# Patient Record
Sex: Male | Born: 1980
Health system: Southern US, Community
[De-identification: ages and names within clinical notes are randomized; demographics above are authoritative.]

## PROBLEM LIST (undated history)

## (undated) DIAGNOSIS — L0591 Pilonidal cyst without abscess: Secondary | ICD-10-CM

## (undated) HISTORY — PX: WISDOM TOOTH EXTRACTION: SHX21

## (undated) HISTORY — DX: Pilonidal cyst without abscess: L05.91

---

## 2002-11-03 ENCOUNTER — Encounter: Payer: Self-pay | Admitting: *Deleted

## 2002-11-03 ENCOUNTER — Emergency Department (HOSPITAL_COMMUNITY): Admission: EM | Admit: 2002-11-03 | Discharge: 2002-11-03 | Payer: Self-pay | Admitting: *Deleted

## 2010-06-14 ENCOUNTER — Emergency Department (HOSPITAL_COMMUNITY): Admission: EM | Admit: 2010-06-14 | Discharge: 2010-06-14 | Payer: Self-pay | Admitting: Emergency Medicine

## 2010-12-04 LAB — BASIC METABOLIC PANEL
BUN: 13 mg/dL (ref 6–23)
CO2: 24 mEq/L (ref 19–32)
Calcium: 9.6 mg/dL (ref 8.4–10.5)
Chloride: 104 mEq/L (ref 96–112)
Creatinine, Ser: 1.05 mg/dL (ref 0.4–1.5)
GFR calc Af Amer: >60 mL/min (ref >60)
GFR calc non Af Amer: >60 mL/min (ref >60)
Glucose, Bld: 83 mg/dL (ref 70–99)
Potassium: 3.8 mEq/L (ref 3.5–5.1)
Sodium: 136 mEq/L (ref 135–145)

## 2010-12-04 LAB — POCT CARDIAC MARKERS
CKMB, poc: <1 ng/mL — ABNORMAL LOW (ref 1.0–8.0)
Myoglobin, poc: 52.4 ng/mL (ref 12–200)
Troponin i, poc: <0.05 ng/mL (ref 0.00–0.09)

## 2010-12-04 LAB — CBC
HCT: 45.7 % (ref 39.0–52.0)
Hemoglobin: 15.2 g/dL (ref 13.0–17.0)
MCH: 28.3 pg (ref 26.0–34.0)
MCHC: 33.2 g/dL (ref 30.0–36.0)
MCV: 85.3 fL (ref 78.0–100.0)
Platelets: 287 10*3/uL (ref 150–400)
RBC: 5.36 MIL/uL (ref 4.22–5.81)
RDW: 13.6 % (ref 11.5–15.5)
WBC: 10.1 10*3/uL (ref 4.0–10.5)

## 2010-12-04 LAB — D-DIMER, QUANTITATIVE: D-Dimer, Quant: 0.22 ug/mL-FEU (ref 0.00–0.48)

## 2013-01-16 ENCOUNTER — Ambulatory Visit (INDEPENDENT_AMBULATORY_CARE_PROVIDER_SITE_OTHER): Payer: Self-pay | Admitting: Family Medicine

## 2013-01-16 ENCOUNTER — Encounter: Payer: Self-pay | Admitting: Family Medicine

## 2013-01-16 VITALS — BP 110/80 | Temp 98.8°F | Ht 71.0 in | Wt 207.2 lb

## 2013-01-16 DIAGNOSIS — J019 Acute sinusitis, unspecified: Secondary | ICD-10-CM

## 2013-01-16 DIAGNOSIS — R509 Fever, unspecified: Secondary | ICD-10-CM

## 2013-01-16 MED ORDER — AZITHROMYCIN 250 MG PO TABS: ORAL_TABLET | ORAL | Status: DC

## 2013-01-16 MED ORDER — HYDROCODONE-HOMATROPINE 5-1.5 MG/5ML PO SYRP: 5.0000 mL | ORAL_SOLUTION | Freq: Four times a day (QID) | ORAL | Status: DC | PRN

## 2013-01-16 NOTE — Progress Notes (Signed)
  Subjective:    Patient ID: ZAYVIER CARAVELLO, male    DOB: [birth year], 32 y.o.   MRN: [id number]  Fever  This is a new problem. The current episode started in the past 7 days. The problem occurs intermittently. The problem has been unchanged. The maximum temperature noted was 100 to 100.9 F. The temperature was taken using an oral thermometer. Associated symptoms include congestion, coughing and muscle aches. Treatments tried: dayquil and nyquil. The treatment provided mild relief.   Patient relates there is no one else has been sick around him but this has all come on since Friday.   Review of Systems  Constitutional: Positive for fever.  HENT: Positive for congestion.   Respiratory: Positive for cough.        Objective:   Physical Exam On exam eardrums normal throat minimal erythema neck is supple mild sinus tenderness lungs are clear no wheezing or difficulty breathing.       Assessment & Plan:  Febrile illness with probable sinusitis-Zithromax as prescribed. Hycodan at nighttime as needed for cough cautioned drowsiness, if progressive symptoms or worse over the next 48 hours to notify us.he was told that he probably has a flulike illness with some secondary infection.

## 2014-12-31 ENCOUNTER — Encounter: Payer: Self-pay | Admitting: Family Medicine

## 2014-12-31 ENCOUNTER — Ambulatory Visit (INDEPENDENT_AMBULATORY_CARE_PROVIDER_SITE_OTHER): Payer: 59 | Admitting: Family Medicine

## 2014-12-31 VITALS — BP 110/84 | Temp 100.4°F | Ht 71.0 in | Wt 218.0 lb

## 2014-12-31 DIAGNOSIS — J1189 Influenza due to unidentified influenza virus with other manifestations: Secondary | ICD-10-CM | POA: Diagnosis not present

## 2014-12-31 DIAGNOSIS — J111 Influenza due to unidentified influenza virus with other respiratory manifestations: Secondary | ICD-10-CM

## 2014-12-31 MED ORDER — OSELTAMIVIR PHOSPHATE 75 MG PO CAPS: 75.0000 mg | ORAL_CAPSULE | Freq: Two times a day (BID) | ORAL | Status: DC

## 2014-12-31 NOTE — Progress Notes (Signed)
   Subjective:    Patient ID: Herbert Davis, male    DOB: Jul 24, 1981, 34 y.o.   MRN: [id number]  Sinusitis This is a new problem. The current episode started yesterday. The problem is unchanged. The maximum temperature recorded prior to his arrival was 100.4 - 100.9 F. The pain is moderate. Associated symptoms include congestion, coughing, headaches and a sore throat. Past treatments include acetaminophen. The treatment provided mild relief.  Patient states he has no other concerns.   Cough substantial worse when laying down  achey in the back dim energy    100.9  Tylenol,,    Review of Systems  HENT: Positive for congestion and sore throat.   Respiratory: Positive for cough.   Neurological: Positive for headaches.       Objective:   Physical Exam  Alert moderate malaise. No frontal tenderness. No packs or tenderness. Pharynx normal neck supple. Lungs bronchial cough heart regular in rhythm.      Assessment & Plan:  Impression flu plan Tamiflu twice a day 5 days. Symptomatic care discussed. Warning signs discussed WSL

## 2017-07-02 ENCOUNTER — Emergency Department (HOSPITAL_COMMUNITY)
Admission: EM | Admit: 2017-07-02 | Discharge: 2017-07-02 | Disposition: A | Discharge status: Home / Self Care | Payer: Worker's Compensation | Attending: Emergency Medicine | Admitting: Emergency Medicine

## 2017-07-02 ENCOUNTER — Encounter (HOSPITAL_COMMUNITY): Payer: Self-pay | Admitting: Emergency Medicine

## 2017-07-02 DIAGNOSIS — H9202 Otalgia, left ear: Secondary | ICD-10-CM | POA: Diagnosis present

## 2017-07-02 DIAGNOSIS — H7292 Unspecified perforation of tympanic membrane, left ear: Secondary | ICD-10-CM | POA: Diagnosis not present

## 2017-07-02 MED ORDER — OFLOXACIN 0.3 % OT SOLN: 5.0000 [drp] | Freq: Two times a day (BID) | OTIC | 0 refills | Status: AC

## 2017-07-02 NOTE — ED Triage Notes (Signed)
Pt presents to ED after he was at work, was wearing a head set, stated he got the fax machine feedback in his headset, and has been having deafness in his left ear, with a dull ache.  Pt denies any discharge from his ear.

## 2017-07-02 NOTE — ED Notes (Signed)
Patient is A&Ox4.  No signs of distress noted.  Please see providers complete history and physical exam.  

## 2017-07-02 NOTE — ED Provider Notes (Signed)
[facility]-EMERGENCY DEPT Provider Note   CSN: [id number] Arrival date & time: 07/02/17  1804     History   Chief Complaint Chief Complaint  Patient presents with  . Otalgia    HPI Herbert Davis is a 36 y.o. male.  HPI  36 y.o. male, presents to the Emergency Department today due to left ear pain. States this occurred when wearing a headset and hearing a loud noise. This occurred around 3pm. Notes decrease in hearing initially, but slowly returning. States he hears a humming noise constantly. No blood from ear. No pain. Notes discharge. No fevers. No other symptoms noted.    History reviewed. No pertinent past medical history.  There are no active problems to display for this patient.   History reviewed. No pertinent surgical history.     Home Medications    Prior to Admission medications   Medication Sig Start Date End Date Taking? Authorizing Provider  oseltamivir (TAMIFLU) 75 MG capsule Take 1 capsule (75 mg total) by mouth 2 (two) times daily. 12/31/14   Merlyn Albert, MD    Family History History reviewed. No pertinent family history.  Social History Social History  Substance Use Topics  . Smoking status: Never Smoker  . Smokeless tobacco: Never Used  . Alcohol use No     Allergies   Patient has no known allergies.   Review of Systems Review of Systems ROS reviewed and all are negative for acute change except as noted in the HPI.  Physical Exam Updated Vital Signs BP (!) 138/92 (BP Location: Left Arm)   Pulse (!) 115   Temp 98.2 F (36.8 C) (Oral)   Resp (!) 22   SpO2 97%   Physical Exam  Constitutional: He is oriented to person, place, and time. Vital signs are normal. He appears well-developed and well-nourished.  HENT:  Head: Normocephalic and atraumatic.  Right Ear: Hearing, tympanic membrane, external ear and ear canal normal.  Left Ear: Hearing normal.  Left TM with perforation. Discharge noted. No bleeding. No edema.   Eyes: Pupils  are equal, round, and reactive to light. Conjunctivae and EOM are normal.  Neck: Normal range of motion. Neck supple.  Cardiovascular: Normal rate and regular rhythm.   Pulmonary/Chest: Effort normal.  Musculoskeletal: Normal range of motion.  Neurological: He is alert and oriented to person, place, and time.  Skin: Skin is warm and dry.  Psychiatric: He has a normal mood and affect. His speech is normal and behavior is normal. Thought content normal.  Nursing note and vitals reviewed.    ED Treatments / Results  Labs (all labs ordered are listed, but only abnormal results are displayed) Labs Reviewed - No data to display  EKG  EKG Interpretation None       Radiology No results found.  Procedures Procedures (including critical care time)  Medications Ordered in ED Medications - No data to display   Initial Impression / Assessment and Plan / ED Course  I have reviewed the triage vital signs and the nursing notes.  Pertinent labs & imaging results that were available during my care of the patient were reviewed by me and considered in my medical decision making (see chart for details).  Final Clinical Impressions(s) / ED Diagnoses     {I have reviewed the relevant previous healthcare records.  {I obtained HPI from historian.   ED Course:  Assessment: Pt is a 36 y.o. male presents to the Emergency Department today due to left ear pain.  States this occurred when wearing a headset and hearing a loud noise. This occurred around 3pm. Notes decrease in hearing initially, but slowly returning. States he hears a humming noise constantly. No blood from ear. No pain. Notes discharge. No fevers. On exam, pt in NAD. Nontoxic/nonseptic appearing. VSS. Afebrile. Left ear with perforation of TM. No bleeding. No fevers. Rx ABX drops. Counseled on TM rupture precautions. Close follow up to PCP. Plan is to DC home. At time of discharge, Patient is in no acute distress. Vital Signs are stable.  Patient is able to ambulate. Patient able to tolerate PO.   Disposition/Plan:  DC Home Additional Verbal discharge instructions given and discussed with patient.  Pt Instructed to f/u with PCP in the next week for evaluation and treatment of symptoms. Return precautions given Pt acknowledges and agrees with plan  Supervising Physician Vanetta Mulders, MD  Final diagnoses:  Perforation of left tympanic membrane    New Prescriptions New Prescriptions   No medications on file     Audry Pili, Cordelia Poche 07/02/17 2156    Vanetta Mulders, MD 07/03/17 1651

## 2017-07-02 NOTE — Discharge Instructions (Signed)
Please read and follow all provided instructions.  Your diagnoses today include:  1. Perforation of left tympanic membrane     Tests performed today include: Vital signs. See below for your results today.   Medications prescribed:  Take as prescribed   Home care instructions:  Follow any educational materials contained in this packet.  Follow-up instructions: Please follow-up with your primary care provider for further evaluation of symptoms and treatment   Return instructions:  Please return to the Emergency Department if you do not get better, if you get worse, or new symptoms OR  - Fever (temperature greater than 101.57F)  - Bleeding that does not stop with holding pressure to the area    -Severe pain (please note that you may be more sore the day after your accident)  - Chest Pain  - Difficulty breathing  - Severe nausea or vomiting  - Inability to tolerate food and liquids  - Passing out  - Skin becoming red around your wounds  - Change in mental status (confusion or lethargy)  - New numbness or weakness    Please return if you have any other emergent concerns.  Additional Information:  Your vital signs today were: BP (!) 138/92 (BP Location: Left Arm)    Pulse (!) 115    Temp 98.2 F (36.8 C) (Oral)    Resp (!) 22    SpO2 97%  If your blood pressure (BP) was elevated above 135/85 this visit, please have this repeated by your doctor within one month. ---------------

## 2017-07-02 NOTE — ED Notes (Signed)
PT states understanding of care given, follow up care, and medication prescribed. PT is ambulated from ED to car with a steady gait.  

## 2017-07-05 ENCOUNTER — Telehealth: Payer: Self-pay | Admitting: Family Medicine

## 2017-07-05 ENCOUNTER — Encounter: Payer: Self-pay | Admitting: Family Medicine

## 2017-07-05 ENCOUNTER — Ambulatory Visit (INDEPENDENT_AMBULATORY_CARE_PROVIDER_SITE_OTHER): Payer: Managed Care, Other (non HMO) | Admitting: Family Medicine

## 2017-07-05 VITALS — BP 118/84 | Temp 98.3°F | Ht 71.0 in | Wt 220.0 lb

## 2017-07-05 DIAGNOSIS — H9312 Tinnitus, left ear: Secondary | ICD-10-CM | POA: Diagnosis not present

## 2017-07-05 DIAGNOSIS — H9192 Unspecified hearing loss, left ear: Secondary | ICD-10-CM | POA: Diagnosis not present

## 2017-07-05 NOTE — Telephone Encounter (Signed)
Ok so noted 

## 2017-07-05 NOTE — Telephone Encounter (Signed)
FYI, Patient seen today, talked with employer and they are going to set up the referral to an ENT doctor for him so we don't need to do it.

## 2017-07-05 NOTE — Progress Notes (Signed)
   Subjective:    Patient ID: Herbert Davis, male    DOB: 1981/02/01, 36 y.o.   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 The call was a fax coming in and it was loud and went to [facility] Ed on Friday night and left ear was preforation of left tympanic membrane. Still has some pain and has a muffled hearing in that ear. Patient had a loud noise come through his headset caused ear pain he reported it to his supervisor he was referred for evaluation that the ER he was diagnosed with a perforated eardrum he was told to follow-up here he is now having ringing and hearing loss in the left ear he denies any other particular problems denies any previous trouble denies any other injuries Review of Systems No respiratory symptoms head symptoms headache nausea vomiting diarrhea    Objective:   Physical Exam Left ear canal with some wax I can see the eardrum not in totality Right eardrum normal nares normal throat normal neck no masses lungs clear  Hearing screen was done shows some mild deficit on the left side     Assessment & Plan:  Tendinitis Reported eardrum perforation Hearing loss Avoid using headset on the left ear Urgent ENT referral Patient will check with his workplace to make sure that this is not going to go to a specific ENT he will call us back later today

## 2017-07-05 NOTE — Telephone Encounter (Signed)
I think scott saw, not sure if referral done, send to dr scott plz

## 2017-07-06 ENCOUNTER — Telehealth: Payer: Self-pay | Admitting: Family Medicine

## 2017-07-06 ENCOUNTER — Ambulatory Visit: Payer: Managed Care, Other (non HMO) | Admitting: Family Medicine

## 2017-07-06 NOTE — Progress Notes (Signed)
Please disregard ENT referral patient is having a referral ordered through epic.

## 2017-07-06 NOTE — Telephone Encounter (Signed)
Scott saw (see last tele note), plz send to scott

## 2017-07-06 NOTE — Telephone Encounter (Signed)
Spoke with patient and informed him per Dr.Scott Luking- Although his issue  may cause some dizziness and some balance issues and should not be severe, if the patient is falling or having severe issues the patient would need to be rechecked again. The patient needs to be careful with his driving and walking to make sure he is not a danger to himself with accidents or injuries. Hopefully his workplace we'll set him up with ENT on a relatively quick basis. 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 Patient verbalized understanding.

## 2017-07-06 NOTE — Progress Notes (Signed)
According to a phone call from the patient his workplace will be setting him up with ENT because it was Workmen's Comp. Therefore we do not need to do this referral. It can be canceled thank you

## 2017-07-06 NOTE — Addendum Note (Signed)
Addended by: Jeralene Peters on: 07/06/2017 08:33 AM   Modules accepted: Orders

## 2017-07-06 NOTE — Telephone Encounter (Signed)
Patient called stating that he has been experiencing dizzy spells and difficulty with balance. Patient wanted to know if this was normal. Was seen on yesterday with hearing problem and seen at [facility] on Friday with perforation of left tympanic membrane. Please advise?

## 2017-07-06 NOTE — Progress Notes (Signed)
Referral ordered in epic 

## 2017-07-06 NOTE — Telephone Encounter (Signed)
Although his issue  may cause some dizziness and some balance issues and should not be severe, if the patient is falling or having severe issues the patient would need to be rechecked again. The patient needs to be careful with his driving and walking to make sure he is not a danger to himself with accidents or injuries. Hopefully his workplace we'll set him up with ENT on a relatively quick basis. 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

## 2017-07-07 ENCOUNTER — Telehealth: Payer: Self-pay | Admitting: Family Medicine

## 2017-07-07 NOTE — Telephone Encounter (Signed)
Please go forward with ENT referral urgent referral for ear pain dizziness perforated eardrum Stephen[place] or [place] would be fine whoever can see him sooner

## 2017-07-07 NOTE — Telephone Encounter (Signed)
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  He is requesting a referral to ENT.

## 2017-09-27 ENCOUNTER — Encounter: Payer: Self-pay | Admitting: Family Medicine

## 2017-09-27 ENCOUNTER — Ambulatory Visit (INDEPENDENT_AMBULATORY_CARE_PROVIDER_SITE_OTHER): Payer: Managed Care, Other (non HMO) | Admitting: Family Medicine

## 2017-09-27 DIAGNOSIS — G3281 Cerebellar ataxia in diseases classified elsewhere: Secondary | ICD-10-CM

## 2017-09-27 NOTE — Progress Notes (Signed)
   Subjective:    Patient ID: Herbert Davis, male    DOB: May 07, 1981, 37 y.o.   MRN: [id number] Patient arrives office with progressively worsening neurological symptoms.  Due to see a neurologist but not for a full month from now HPI Patient here today with same symptoms as last fall the dizziness, balance issues and  Left ear pain.Was referred to ENT Dr. Ezzard StandingNewman and all test were normal with him per pt.Then Dr. Ezzard StandingNewman referred to Neurology Dr. Anne HahnWillis that appointment is not until Oct 26, 2016.  Out of work since oct  At night gets ear pain and dizziness and discomfort   Dizzy hits when turns head  See emergency room report.  See prior notes.  Patient's difficulty started with extremely loud sound in his ear.  This led to a question TM perforation.  See the ER note.  This occurred at work.  Patient initially had dizziness unsteadiness vertigo-like symptoms which would occur the first moments after movement.  Progressive however over the past 6 weeks he has developed further neurological symptoms.  Unsteadiness.  Dizziness.  Difficult walking.  Substantial.  Now using a cane.  Has moved home.  No longer able to drive.  No longer able to work.  Review of Systems No headache, no major weight loss or weight gain, no chest pain no back pain abdominal pain no change in bowel habits complete ROS otherwise negative     Objective:   Physical Exam Alert and oriented, vitals reviewed and stable, NAD ENT-TM's and ext canals WNL bilat via otoscopic exam Soft palate, tonsils and post pharynx WNL via oropharyngeal exam Neck-symmetric, no masses; thyroid nonpalpable and nontender Pulmonary-no tachypnea or accessory muscle use; Clear without wheezes via auscultation Card--no abnrml murmurs, rhythm reg and rate WNL Carotid pulses symmetric, without bruits Neurological findings concerning.  Patient has diffuse weakness of legs 2/5 bilateral.  Hyperreflexia.  4+ both ankles.  Less minus clonus on the  right ankle.  Walks with a cane.  Wide standing ataxic gait.  Cerebellar functions positive left arm.  Diminished strength left arm       Assessment & Plan:  Impression progressive neurological decompensation over the past several months.  I am very concerned that he has some more serious neurologically speaking than simple inner ear barotrauma.  His neurological decline continues to worsen substantially.  Both symptoms and exam performed towards a central nervous system process.  I think urgent brain MRI along with subsequent action based on results is definitely warranted.  Reason for MRI progressive ataxic gait.  Neuromuscular weakness.  Hyperreflexia.

## 2017-09-30 ENCOUNTER — Ambulatory Visit (HOSPITAL_COMMUNITY): Payer: Managed Care, Other (non HMO)

## 2017-10-01 ENCOUNTER — Telehealth: Payer: Self-pay | Admitting: Family Medicine

## 2017-10-01 NOTE — Telephone Encounter (Signed)
Please let the patient know that his MRI of the brain was negative.  He did not show any tumors growth or strokes.  It would be a good idea for the patient to obtain the images on a CD ROM from the [place] [facility].  He can take this with him when he sees a neurologist in the first part of February.(A copy of this message is also being forwarded to Dr. Lubertha SouthSteve Kathrynn Backstrom)

## 2017-10-01 NOTE — Telephone Encounter (Signed)
Patient advised per Dr Lorin PicketScott : His MRI of the brain was negative.  He did not show any tumors growth or strokes.  It would be a good idea for the patient to obtain the images on a CD ROM from the [place] [facility].  He can take this with him when he sees a neurologist in the first part of February. Patient verbalized understanding.

## 2017-10-26 ENCOUNTER — Ambulatory Visit (INDEPENDENT_AMBULATORY_CARE_PROVIDER_SITE_OTHER): Payer: Managed Care, Other (non HMO) | Admitting: Neurology

## 2017-10-26 ENCOUNTER — Encounter: Payer: Self-pay | Admitting: Neurology

## 2017-10-26 VITALS — BP 133/84 | HR 127 | Ht 71.0 in | Wt 219.5 lb

## 2017-10-26 DIAGNOSIS — H81399 Other peripheral vertigo, unspecified ear: Secondary | ICD-10-CM | POA: Diagnosis not present

## 2017-10-26 DIAGNOSIS — M79602 Pain in left arm: Secondary | ICD-10-CM | POA: Diagnosis not present

## 2017-10-26 NOTE — Progress Notes (Signed)
Reason for visit: Dizziness, gait instability  Referring physician: Dr. Truitt Merle Herbert Davis is a 37 y.o. male  History of present illness:  Herbert Davis is a 37 year old left-handed white male with a history of a work-related injury.  The patient indicates that around 02 July 2017 he was at work wearing headphones.  The fax machine kicked in and he got a loud noise in his ears from this.  The patient has had onset of tinnitus and some dizziness that began within 24 hours of this event.  He was seen through ENT with Dr. Narda Bonds, audiometry showed no hearing impairment.  The patient had ENG evaluation that showed no evidence of vestibular dysfunction or benign positional vertigo.  The patient does not report true vertigo, he has more of a lightheaded sensation.  Within a week or so after the episode, he began having some pain around the left ear, left neck pain and discomfort down the left arm.  The patient has had some tingling sensations in the fourth and fifth fingers of the left hand, some sensation of weakness in the left hand.  The patient has had an extremely wide-based gait, he uses a cane for ambulation.  He reports no falls.  He denies issues controlling the bowels or the bladder.  He has not had any double vision or loss of vision or overt headache.  The patient has undergone MRI of the brain that was normal, the disc was brought for my review.  The patient is sent here for further evaluation.  The patient indicates that Workmen's Compensation was denied, he intends to get an attorney to appeal the denial.  History reviewed. No pertinent past medical history.  History reviewed. No pertinent surgical history.  History reviewed. No pertinent family history.  Social history:  reports that  has never smoked. he has never used smokeless tobacco. He reports that he does not drink alcohol or use drugs.  Medications:  Prior to Admission medications   Not on File     No Known  Allergies  ROS:  Out of a complete 14 system review of symptoms, the patient complains only of the following symptoms, and all other reviewed systems are negative.  Ringing in the ears Headache, numbness, dizziness Not enough sleep  Blood pressure 133/84, pulse (!) 127, height 5\' 11"  (1.803 m), weight 219 lb 8 oz (99.6 kg).  Physical Exam  General: The patient is alert and cooperative at the time of the examination.  Eyes: Pupils are equal, round, and reactive to light. Discs are flat bilaterally.  Neck: The neck is supple, no carotid bruits are noted.  Respiratory: The respiratory examination is clear.  Cardiovascular: The cardiovascular examination reveals a regular rate and rhythm, no obvious murmurs or rubs are noted.  Skin: Extremities are without significant edema.  Neurologic Exam  Mental status: The patient is alert and oriented x 3 at the time of the examination. The patient has apparent normal recent and remote memory, with an apparently normal attention span and concentration ability.  Cranial nerves: Facial symmetry is present. There is good sensation of the face to pinprick and soft touch bilaterally. The strength of the facial muscles and the muscles to head turning and shoulder shrug are normal bilaterally. Speech is well enunciated, no aphasia or dysarthria is noted. Extraocular movements are full. Visual fields are full. The tongue is midline, and the patient has symmetric elevation of the soft palate. No obvious hearing deficits are noted.  Motor: The motor testing reveals 5 over 5 strength of all 4 extremities.  There appears to be some giveaway weakness with the left arm, no true weakness is seen.  Good symmetric motor tone is noted throughout.  Sensory: Sensory testing is intact to pinprick, soft touch, vibration sensation, and position sense on all 4 extremities. No evidence of extinction is noted.  Coordination: Cerebellar testing reveals good  finger-nose-finger and heel-to-shin bilaterally. The Nyan-Barrany procedure does reveal some slight asymmetry of end gaze nystagmus, more prominent to the left.  Gait and station: Gait is extremely wide-based, the patient normally walks with a cane.  Tandem gait is unsteady.  Romberg is positive, the patient will follow up to the side. No drift is seen.  Reflexes: Deep tendon reflexes are symmetric and normal bilaterally. Toes are downgoing bilaterally.   Assessment/Plan:  1.  Reports of dizziness, gait instability  2.  Left neck pain, left arm pain and numbness  So far the clinical evaluation has been unrevealing.  The patient has had a normal MRI of the brain, normal audiometric testing, normal ENG evaluation.  The patient does report neck pain and some left arm discomfort and numbness.  The clinical examination suggests the possibility of an over exaggerated gait disorder.  There is some giveaway weakness with the left arm.  The patient will be sent for MRI of the cervical spine given the reports of neck pain and left arm discomfort.  The patient will follow-up in 4 months.  Marlan Palau. Keith Willis MD 10/26/2017 2:26 PM  [facility] [address] [place], [place] [id number]  Phone [phone] Fax [phone]

## 2017-10-26 NOTE — Patient Instructions (Signed)
   We will get MRI of the cervical spine. 

## 2017-11-05 ENCOUNTER — Telehealth: Payer: Self-pay | Admitting: Neurology

## 2017-11-05 DIAGNOSIS — M542 Cervicalgia: Secondary | ICD-10-CM

## 2017-11-05 NOTE — Telephone Encounter (Signed)
Herbert [employer] will deny the MRI of the cervical spine as the patient has not undergone at least 6 weeks of conservative management.  We will cancel the MRI of the cervical spine, set the patient up for physical therapy, follow-up with the patient.

## 2017-11-05 NOTE — Telephone Encounter (Signed)
Noted, I contacted Victorino DikeJennifer at [facility] and she is going to cancel his appt.

## 2017-11-05 NOTE — Telephone Encounter (Signed)
Dr. Pattricia BossWIlis this is a peer to peer. PT due for scan on 11/08/2017 Monday.

## 2017-11-05 NOTE — Telephone Encounter (Signed)
Rosann [employer] did not approve the MRI Cervical. The Phone number for the peer to peer is [phone] and the case number is [id number]. Right now he is schedule to have the exam done on Monday [facility]/17 at [facility].

## 2017-11-05 NOTE — Telephone Encounter (Signed)
Per. Dr.Willis MRI will be cancel due. EMily did contacted [facility].

## 2017-11-08 ENCOUNTER — Inpatient Hospital Stay: Admission: RE | Admit: 2017-11-08 | Payer: Self-pay | Source: Ambulatory Visit

## 2017-11-22 ENCOUNTER — Other Ambulatory Visit: Payer: Self-pay

## 2017-11-22 ENCOUNTER — Ambulatory Visit: Payer: Managed Care, Other (non HMO) | Attending: Neurology | Admitting: Physical Therapy

## 2017-11-22 ENCOUNTER — Encounter: Payer: Self-pay | Admitting: Physical Therapy

## 2017-11-22 DIAGNOSIS — R2681 Unsteadiness on feet: Secondary | ICD-10-CM | POA: Insufficient documentation

## 2017-11-22 DIAGNOSIS — M6281 Muscle weakness (generalized): Secondary | ICD-10-CM | POA: Insufficient documentation

## 2017-11-22 DIAGNOSIS — R208 Other disturbances of skin sensation: Secondary | ICD-10-CM

## 2017-11-22 DIAGNOSIS — M542 Cervicalgia: Secondary | ICD-10-CM

## 2017-11-22 DIAGNOSIS — R262 Difficulty in walking, not elsewhere classified: Secondary | ICD-10-CM | POA: Insufficient documentation

## 2017-11-22 DIAGNOSIS — R42 Dizziness and giddiness: Secondary | ICD-10-CM | POA: Insufficient documentation

## 2017-11-22 NOTE — Therapy (Signed)
[facility] [address] [place], [place], [address] Phone: [phone]   Fax:  [phone]  Physical Therapy Evaluation  Patient Details  Name: Herbert Davis MRN: [id number] Date of Birth: 10/25/1980 Referring Provider: York Spaniel, MD   Encounter Date: 11/22/2017  PT End of Session - 11/22/17 1301    Visit Number  1    Number of Visits  9    Date for PT Re-Evaluation  01/21/18    Authorization Type  [employer] Managed    PT Start Time  1150    PT Stop Time  1244    PT Time Calculation (min)  54 min    Activity Tolerance  Patient tolerated treatment well    Behavior During Therapy  [facility] for tasks assessed/performed       History reviewed. No pertinent past medical history.  History reviewed. No pertinent surgical history.  There were no vitals filed for this visit.   Subjective Assessment - 11/22/17 1153    Subjective  Pt referred to PT due to dizziness and balance issues and neck pain/stiffness that began in October.  Went to ED in October after hearing loud noises in ear (sounded like a fax machine) but only found mild hearing loss.  Referred to ENT and then neurology.  Has not driven since October due to dizziness and had to start using a cane.  Also reporting tingling in L ring and pinky fingers - intermittent but concurrent with neck pain and stiffness.  Neck pain began a while after dizziness began.  Has had MRI of brain but insurance will not approve MRI of neck until pt participates in PT.      Pertinent History  no PMH    Limitations  Walking    Diagnostic tests  Per neurology notes: The patient has had a normal MRI of the brain, normal audiometric testing, normal ENG evaluation    Patient Stated Goals  Get rid of the dizziness; can deal with neck pain and tingling    Currently in Pain?  Yes    Pain Score  2     Pain Location  Finger (Comment which one)    Pain Orientation  Left    Pain Descriptors /  Indicators  Tingling    Pain Type  Neuropathic pain    Pain Onset  More than a month ago    Pain Frequency  Occasional         [facility] PT Assessment - 11/22/17 1200      Assessment   Medical Diagnosis  Neck pain, dizziness, imbalance    Referring Provider  York Spaniel, MD    Onset Date/Surgical Date  -- October 2018    Hand Dominance  Left    Next MD Visit  August f/u with Dr. Anne Hahn    Prior Therapy  none      Precautions   Precautions  Fall      Balance Screen   Has the patient fallen in the past 6 months  No    Has the patient had a decrease in activity level because of a fear of falling?   Yes    Is the patient reluctant to leave their home because of a fear of falling?   Yes      Home Environment   Living Environment  Private residence    Living Arrangements  Parent prior to injury living alone    Type of Home  House  Home Access  Stairs to enter    Entrance Stairs-Number of Steps  3    Entrance Stairs-Rails  Right;Left    Home Layout  One level    Home Equipment  [place] - single point      Prior Function   Level of Independence  Independent    Vocation  Full time employment    Vocation Requirements  [employer] has not returned to work since October      Observation/Other Assessments   Focus on Therapeutic Outcomes (FOTO)   not indicated for diagnosis      Sensation   Light Touch  Impaired Detail    Light Touch Impaired Details  Impaired LUE 4th and 5th finger    Hot/Cold  Appears Intact    Proprioception  Impaired Detail    Proprioception Impaired Details  Impaired LUE      Coordination   Gross Motor Movements are Fluid and Coordinated  No    Finger Nose Finger Test  delayed; difficulty focusing on finger    Heel Shin Test  delayed on L      Posture/Postural Control   Posture Comments  rests with head in forward head posture, head tilted to L      ROM / Strength   AROM / PROM / Strength  Strength;AROM      AROM   Overall AROM Comments   Reports pain begins in posterior neck and radiates to posterior shoulder and then feels tingling in L medial fingers    AROM Assessment Site  Cervical    Cervical Flexion  40, pain L neck/shoulder with over pressure    Cervical Extension  20, dizziness    Cervical - Right Side Bend  50    Cervical - Left Side Bend  55; dizziness    Cervical - Right Rotation  50; dizziness    Cervical - Left Rotation  35; dizziness and tingling in L fingers      Strength   Overall Strength  Deficits    Strength Assessment Site  Shoulder;Elbow;Forearm;Wrist;Hand;Hip;Knee;Ankle    Right/Left Shoulder  Right;Left    Right Shoulder Flexion  5/5    Left Shoulder Flexion  4/5    Right/Left Elbow  Right;Left    Right Elbow Flexion  5/5    Right Elbow Extension  5/5    Left Elbow Flexion  4/5    Left Elbow Extension  4/5    Right/Left Wrist  Right;Left    Right Wrist Flexion  5/5    Right Wrist Extension  5/5    Left Wrist Flexion  4/5    Left Wrist Extension  4/5    Right Hand Grip (lbs)  44 kg    Right Hand Lateral Pinch  22 lbs    Right Hand 3 Point Pinch  18 lbs    Left Hand Grip (lbs)  24 kg    Left Hand Lateral Pinch  30 lbs    Left Hand 3 Point Pinch  22 lbs    Right/Left Hip  Right;Left    Right Hip Flexion  5/5    Left Hip Flexion  5/5    Right/Left Knee  Left;Right    Right Knee Flexion  5/5    Right Knee Extension  5/5    Left Knee Flexion  5/5    Left Knee Extension  5/5    Right/Left Ankle  Right;Left    Right Ankle Dorsiflexion  5/5    Left Ankle Dorsiflexion  5/5      Ambulation/Gait   Ambulation/Gait  Yes    Ambulation/Gait Assistance  4: Min guard    Ambulation Distance (Feet)  50 Feet    Assistive device  Straight cane    Gait Pattern  Step-through pattern;Decreased arm swing - right;Decreased arm swing - left;Decreased weight shift to right;Decreased weight shift to left;Decreased trunk rotation;Wide base of support    Ambulation Surface  Level;Indoor    Gait velocity  18  seconds or 1.75 ft/sec      Standardized Balance Assessment   Standardized Balance Assessment  10 meter walk test    10 Meter Walk  18 seconds or 1.75 ft/sec         Vestibular Assessment - 11/22/17 1222      Vestibular Assessment   General Observation  Minimal hearing loss in L ear; denies changes in vision; does experience nausea but no vomiting.  Reports pain in L ear.  Denies changes in speech or swallowing.  Reports occasional headaches/migraines in the past but no migraines recently.           Objective measurements completed on examination: See above findings.              PT Education - 11/22/17 1300    Education provided  Yes    Education Details  clinical findings, PT POC and goals, focus of treatment    Person(s) Educated  Patient    Methods  Explanation    Comprehension  Verbalized understanding       PT Short Term Goals - 11/22/17 1314      PT SHORT TERM GOAL #1   Title  Pt will tolerate full vestibular assessment and assessment of FGA    Time  4    Period  Weeks    Status  New    Target Date  12/22/17      PT SHORT TERM GOAL #2   Title  Pt will tolerate gentle cervical manual therapy and will demonstrate 5-8 deg increase in cervical flexion, extension, side bending and rotation     Baseline  See evaluation flow sheet for exact ROM measures    Time  4    Period  Weeks    Status  New    Target Date  12/22/17      PT SHORT TERM GOAL #3   Title  Pt will demonstrate improvement in LUE strength from 4/5 to 4+/5 for shoulder elevation, flexion, elbow flex/ext, wrist flex/ext; will improve grip strength by 5 kg    Baseline  see flow sheet for eval measurements    Time  4    Period  Weeks    Status  New    Target Date  12/22/17      PT SHORT TERM GOAL #4   Title  Will report 25% improvement in neck pain and episodes of L 4th/5th finger tingling    Time  4    Period  Weeks    Status  New    Target Date  12/22/17      PT SHORT TERM GOAL #5    Title  Will tolerate seated vestibular habituation and gaze adapatation exercises with <5/10 dizziness    Time  4    Period  Weeks    Status  New    Target Date  12/22/17      Additional Short Term Goals   Additional Short Term Goals  Yes      PT SHORT  TERM GOAL #6   Title  Will improve gait velocity to >/= 1.9 ft/sec with cane to decrease risk for falls in home environment with more narrow BOS and increased arm swing    Baseline  1.75 with decreased arm swing and wide BOS    Time  4    Period  Weeks    Status  New    Target Date  12/22/17        PT Long Term Goals - 11/22/17 1325      PT LONG TERM GOAL #1   Title  Pt will demonstrate independence with neck ROM, LUE strengthening, vestibular, standing balance HEP    Time  8    Period  Weeks    Status  New    Target Date  01/21/18      PT LONG TERM GOAL #2   Title  Pt will report 50% improvement in dizziness with head movements, turning and with gait and will tolerate gaze adaptation exercises in standing (x1 viewing).    Time  8    Period  Weeks    Status  New    Target Date  01/21/18      PT LONG TERM GOAL #3   Title  Pt will improve gait velocity to >/=2.6 ft/sec without AD for improved safety with gait in community    Time  8    Period  Weeks    Status  New    Target Date  01/21/18      PT LONG TERM GOAL #4   Title  Pt will improve FGA by 4 points to decrease risk for falls    Baseline  TBD    Time  8    Period  Weeks    Status  New    Target Date  01/21/18      PT LONG TERM GOAL #5   Title  Pt will improve neck pain/tingling to <2/10 overall and will increase cervical ROM by 10-12 deg     Baseline  see flow sheet for updated ROM     Time  8    Period  Weeks    Status  New    Target Date  01/21/18      PT LONG TERM GOAL #6   Title  Pt will ambulate x 250' over indoor and outdoor paved surfaces without cane at MOD I level; will report return to driving short distances along well known routes.    Time   8    Period  Weeks    Status  New    Target Date  01/21/18             Plan - 11/22/17 1302    Clinical Impression Statement  Pt is a 37 year old fe/male referred to Neuro OPPT for evaluation of neck pain, dizziness and imbalance since October 2018.  Pt has no significant PMH and prior to episode in October pt was fully independent, working full time, driving and living alone.  Since October pt has had to move in with parents, has been unable to drive or work and has to ambulate with a cane. The following deficits were noted during pt's exam: dizziness including initial episode of vertigo with ongoing disequilibrium, motion sensitivity and vestibular hypofunction, cervical pain and decreased ROM, impaired sensation, coordination and weakness LUE, impaired balance and impaired gait.  Pt's gait speed indicates pt is at risk for recurrent falls.  Differential diagnosis includes an acute vestibular neuritis  vs. Meniere's disease with cervical radicular symptoms likely due to pt self-limiting neck movement.  Pt would benefit from skilled PT to address these impairments and functional limitations to maximize functional mobility independence and reduce falls risk.    History and Personal Factors relevant to plan of care:  no PMH and was independent, working full time and living alone prior to October; now lives with parents, is unable to work or drive due to dizziness and must ambulate with cane.  Neck pain and LUE weakness/tingling of unknown etiology, risk for falls    Clinical Presentation  Evolving    Clinical Presentation due to:  no PMH and was independent, working full time and living alone prior to October; now lives with parents, is unable to work or drive due to dizziness and must ambulate with cane.  Neck pain and LUE weakness/tingling of unknown etiology, risk for falls    Clinical Decision Making  Moderate    Rehab Potential  Good    PT Frequency  1x / week    PT Duration  8 weeks    PT  Treatment/Interventions  ADLs/Self Care Home Management;Canalith Repostioning;Cryotherapy;Moist Heat;Traction;DME Instruction;Gait training;Stair training;Functional mobility training;Therapeutic exercise;Therapeutic activities;Balance training;Neuromuscular re-education;Patient/family education;Manual techniques;Passive range of motion;Dry needling;Taping;Vestibular    PT Next Visit Plan  assess for Dry needling of neck mm; gentle neck ROM.  Finish vestibular eval.  FGA and revise goals.  Initiate gentle habituation and neck ROM HEP.    PT Home Exercise Plan  LUE strengthening/grip strengthening, neck ROM, habituation, gaze adaptation, standing balance/gait    Recommended Other Services  OT?    Consulted and Agree with Plan of Care  Patient       Patient will benefit from skilled therapeutic intervention in order to improve the following deficits and impairments:  Abnormal gait, Decreased activity tolerance, Decreased balance, Decreased endurance, Decreased mobility, Decreased range of motion, Decreased strength, Difficulty walking, Dizziness, Impaired sensation, Postural dysfunction, Pain  Visit Diagnosis: Dizziness and giddiness  Cervicalgia  Unsteadiness on feet  Difficulty in walking, not elsewhere classified  Other disturbances of skin sensation  Muscle weakness (generalized)     Problem List There are no active problems to display for this patient.   Dierdre Highman, PT, DPT 11/22/17    1:40 PM    [facility] [address] [place], [place], [address] Phone: [phone]   Fax:  [phone]  Name: Herbert Davis MRN: [id number] Date of Birth: [birth year]

## 2017-11-29 ENCOUNTER — Ambulatory Visit: Payer: Managed Care, Other (non HMO) | Admitting: Physical Therapy

## 2017-12-09 ENCOUNTER — Telehealth: Payer: Self-pay | Admitting: Physical Therapy

## 2017-12-09 NOTE — Telephone Encounter (Signed)
Contacted pt regarding plan for future therapy visits due to recent termination of insurance and lack of coverage.  LVM and requested pt to call back to discuss options including [facility] PT clinics in the area.  Will await call back from patient and will attempt to contact patient again in a few days.  Dierdre HighmanAudra F Gaetana Kawahara, PT, DPT 12/09/17    9:57 AM

## 2017-12-10 ENCOUNTER — Ambulatory Visit: Payer: Self-pay | Admitting: Rehabilitative and Restorative Service Providers"

## 2017-12-16 ENCOUNTER — Ambulatory Visit: Payer: Self-pay | Admitting: Physical Therapy

## 2017-12-23 ENCOUNTER — Ambulatory Visit: Payer: BLUE CROSS/BLUE SHIELD | Attending: Neurology | Admitting: Physical Therapy

## 2017-12-23 ENCOUNTER — Encounter: Payer: Self-pay | Admitting: Physical Therapy

## 2017-12-23 DIAGNOSIS — R208 Other disturbances of skin sensation: Secondary | ICD-10-CM | POA: Diagnosis not present

## 2017-12-23 DIAGNOSIS — M5412 Radiculopathy, cervical region: Secondary | ICD-10-CM | POA: Insufficient documentation

## 2017-12-23 DIAGNOSIS — R42 Dizziness and giddiness: Secondary | ICD-10-CM | POA: Insufficient documentation

## 2017-12-23 DIAGNOSIS — M6281 Muscle weakness (generalized): Secondary | ICD-10-CM | POA: Diagnosis not present

## 2017-12-23 DIAGNOSIS — R2681 Unsteadiness on feet: Secondary | ICD-10-CM | POA: Diagnosis not present

## 2017-12-23 DIAGNOSIS — R262 Difficulty in walking, not elsewhere classified: Secondary | ICD-10-CM | POA: Diagnosis not present

## 2017-12-23 DIAGNOSIS — M542 Cervicalgia: Secondary | ICD-10-CM | POA: Diagnosis not present

## 2017-12-23 NOTE — Therapy (Addendum)
[facility] [address] [place], [place], [address] Phone: [phone]   Fax:  [phone]  Physical Therapy Treatment  Patient Details  Name: Herbert Davis MRN: [id number] Date of Birth: 05-Jan-1981 Referring Provider: York Spaniel, MD   Encounter Date: 12/23/2017   12/23/17 1218  PT Visits / Re-Eval  Visit Number 2  Number of Visits 10 (visits extended )  Date for PT Re-Evaluation 02/21/18 (date extended due to multiple missed weeks -insurance change)  Authorization  Authorization Type [employer]  PT Time Calculation  PT Start Time 1118  PT Stop Time 1200  PT Time Calculation (min) 42 min  PT - End of Session  Activity Tolerance Patient limited by pain  Behavior During Therapy Flat affect    History reviewed. No pertinent past medical history.  History reviewed. No pertinent surgical history.  There were no vitals filed for this visit.  Subjective Assessment - 12/23/17 1120    Subjective  Returns after obtaining new insurance.  About the same, having good days and bad days.  Today is a bad day.  Increased pain in L side of neck.  Mild dizziness.    Pertinent History  no PMH    Limitations  Walking    Diagnostic tests  Per neurology notes: The patient has had a normal MRI of the brain, normal audiometric testing, normal ENG evaluation    Patient Stated Goals  Get rid of the dizziness; can deal with neck pain and tingling    Currently in Pain?  Yes    Pain Score  7     Pain Location  Neck    Pain Orientation  Left    Pain Descriptors / Indicators  Numbness;Discomfort    Pain Type  Chronic pain;Neuropathic pain    Pain Onset  More than a month ago         [facility] PT Assessment - 12/23/17 1151      Palpation   Palpation comment  Hypomobility cervical spine upper, mid, lower with decreased mobility L > R; hypertrophy on L side      Special Tests    Special Tests  Cervical    Cervical Tests  Dictraction       Distraction Test   Findngs  Positive    side  Left    Comment  Improved symptoms of pain and numbness/tingling down L         Vestibular Assessment - 12/23/17 1123      Vestibular Assessment   General Observation  hearing loss remains the same; dizziness the same.  Nausea has improved.  Continues to have pain in L ear.  Sits with head tilted to R      Symptom Behavior   Type of Dizziness  Spinning disoriented    Frequency of Dizziness  daily    Duration of Dizziness  minutes    Aggravating Factors  Comment;Mornings;Activity in general sudden movement    Relieving Factors  Closing eyes;Head stationary      Occulomotor Exam   Occulomotor Alignment  Normal    Spontaneous  Absent    Gaze-induced  Absent    Smooth Pursuits  Intact dizziness with vertical eye movements    Saccades  Poor trajectory trigger dizziness    Comment  Convergence: intact; Cover and cross-cover negative      Vestibulo-Occular Reflex   VOR to Slow Head Movement  Comment triggers dizziness    VOR Cancellation  Comment normal but triggers  dizziness    Comment  HIT: significant mm guarding in neck; startled with HIT to R with more difficulty maintaining gaze but not overt corrective saccade; - to L      Positional Sensitivities   Sit to Supine  Moderate dizziness    Supine to Left Side  Moderate dizziness    Supine to Right Side  Mild dizziness    Supine to Sitting  Severe dizziness    Nose to Right Knee  Lightheadedness    Right Knee to Sitting  Lightedness    Nose to Left Knee  Severe dizziness    Left Knee to Sitting  Severe dizziness    Rolling Right  Mild dizziness    Rolling Left  Moderate dizziness                       PT Education - 12/23/17 1217    Education provided  Yes    Education Details  habituation training    Person(s) Educated  Patient    Methods  Explanation;Demonstration;Handout    Comprehension  Verbalized understanding       PT Short Term Goals -  12/23/17 1227      PT SHORT TERM GOAL #1   Title  Pt will tolerate full vestibular assessment and assessment of FGA    Time  4    Period  Weeks    Status  On-going    Target Date  01/22/18      PT SHORT TERM GOAL #2   Title  Pt will tolerate gentle cervical manual therapy and will demonstrate 5-8 deg increase in cervical flexion, extension, side bending and rotation     Baseline  See evaluation flow sheet for exact ROM measures    Time  4    Period  Weeks    Status  On-going    Target Date  01/22/18      PT SHORT TERM GOAL #3   Title  Pt will demonstrate improvement in LUE strength from 4/5 to 4+/5 for shoulder elevation, flexion, elbow flex/ext, wrist flex/ext; will improve grip strength by 5 kg    Baseline  see flow sheet for eval measurements    Time  4    Period  Weeks    Status  On-going    Target Date  01/22/18      PT SHORT TERM GOAL #4   Title  Will report 25% improvement in neck pain and episodes of L 4th/5th finger tingling    Time  4    Period  Weeks    Status  On-going    Target Date  01/22/18      PT SHORT TERM GOAL #5   Title  Will tolerate seated vestibular habituation and gaze adapatation exercises with <5/10 dizziness    Time  4    Period  Weeks    Status  On-going    Target Date  01/22/18      PT SHORT TERM GOAL #6   Title  Will improve gait velocity to >/= 1.9 ft/sec with cane to decrease risk for falls in home environment with more narrow BOS and increased arm swing    Baseline  1.75 with decreased arm swing and wide BOS    Time  4    Period  Weeks    Status  On-going    Target Date  01/22/18        PT Long Term Goals - 12/23/17 1227  PT LONG TERM GOAL #1   Title  Pt will demonstrate independence with neck ROM, LUE strengthening, vestibular, standing balance HEP    Time  8    Period  Weeks    Status  On-going    Target Date  02/21/18      PT LONG TERM GOAL #2   Title  Pt will report 50% improvement in dizziness with head movements,  turning and with gait and will tolerate gaze adaptation exercises in standing (x1 viewing).    Time  8    Period  Weeks    Status  On-going    Target Date  02/21/18      PT LONG TERM GOAL #3   Title  Pt will improve gait velocity to >/=2.6 ft/sec without AD for improved safety with gait in community    Time  8    Period  Weeks    Status  On-going    Target Date  02/21/18      PT LONG TERM GOAL #4   Title  Pt will improve FGA by 4 points to decrease risk for falls    Baseline  TBD    Time  8    Period  Weeks    Status  On-going    Target Date  02/21/18      PT LONG TERM GOAL #5   Title  Pt will improve neck pain/tingling to <2/10 overall and will increase cervical ROM by 10-12 deg     Baseline  see flow sheet for updated ROM     Time  8    Period  Weeks    Status  On-going    Target Date  02/21/18      PT LONG TERM GOAL #6   Title  Pt will ambulate x 250' over indoor and outdoor paved surfaces without cane at MOD I level; will report return to driving short distances along well known routes.    Time  8    Period  Weeks    Status  On-going    Target Date  02/21/18            Plan - 12/23/17 1219    Clinical Impression Statement  Pt returns with no change in symptoms.  Initiated assessment of vestibular system with patient demonstrating significant sensitivity to oculomotor and head/neck motion.  Unable to accurately assess VOR with HIT due to neck pain and guarding.  Pt also presents with decreased cervical joint mobility L > R hypomobility.  Initiated habituation exercises with supported head movements and repeated rolling.  Goal dates adjusted and extended due to multiple weeks missed due to financial/insurance issues.  Will continue to address as pt is able to tolerate.    Rehab Potential  Good    PT Frequency  1x / week    PT Duration  8 weeks    PT Treatment/Interventions  ADLs/Self Care Home Management;Canalith Repostioning;Cryotherapy;Moist Heat;Traction;DME  Instruction;Gait training;Stair training;Functional mobility training;Therapeutic exercise;Therapeutic activities;Balance training;Neuromuscular re-education;Patient/family education;Manual techniques;Passive range of motion;Dry needling;Taping;Vestibular    PT Next Visit Plan  gentle neck ROM, traction- provide with neck HEP.  perform FGA and revise goals.  Finish assessment of motion sensitivity.  Sit <> stand with more narrow BOS.  Balance training.  X 1 viewing for habituation    PT Home Exercise Plan  LUE strengthening/grip strengthening, neck ROM, habituation, gaze adaptation, standing balance/gait    Recommended Other Services  Would he be open to OT???  If so, we  need to request order    Consulted and Agree with Plan of Care  Patient       Patient will benefit from skilled therapeutic intervention in order to improve the following deficits and impairments:  Abnormal gait, Decreased activity tolerance, Decreased balance, Decreased endurance, Decreased mobility, Decreased range of motion, Decreased strength, Difficulty walking, Dizziness, Impaired sensation, Postural dysfunction, Pain  Visit Diagnosis: Dizziness and giddiness  Cervicalgia  Unsteadiness on feet  Difficulty in walking, not elsewhere classified     Problem List There are no active problems to display for this patient.  Dierdre Highman, PT, DPT 12/23/17    12:30 PM    [place] [facility] [address] [place], [place], [address] Phone: [phone]   Fax:  [phone]  Name: Herbert Davis MRN: [id number] Date of Birth: [birth year]

## 2017-12-23 NOTE — Patient Instructions (Addendum)
Head Motion: Side to Side    Sitting in supported chair, tilt head down 15-30, slowly move head to right with eyes open. Hold position until symptoms subside. Then, move head slowly to opposite side. Hold position until symptoms subside. Repeat __5__ times per session. Do ___2_ sessions per day.  Copyright  VHI. All rights reserved.  Head Motion: Up and Down    Sitting in supported chair, slowly move head up with eyes open. Hold position until symptoms subside. Then, move head in opposite direction. Hold position until symptoms subside. Repeat __2-3__ times per session. Do _2___ sessions per day.  Copyright  VHI. All rights reserved.  Rolling    With pillow under head, start on back. Roll slowly to right. Hold position until symptoms subside. Roll slowly onto left side. Hold position until symptoms subside. Repeat sequence __3-5__ times per session. Do ___2_ sessions per day.  Copyright  VHI. All rights reserved.

## 2017-12-24 ENCOUNTER — Telehealth: Payer: Self-pay | Admitting: Physical Therapy

## 2017-12-24 DIAGNOSIS — M5412 Radiculopathy, cervical region: Secondary | ICD-10-CM

## 2017-12-24 NOTE — Telephone Encounter (Signed)
Hello Dr. Anne HahnWillis, We evaluated Herbert Davis for vestibular rehabilitation and neck pain but had to put treatment on hold due to lack of insurance.  He has since returned with new insurance and continues to present with cervical radicular symptoms.  We are hesitant to proceed with cervical treatment because we are unsure of the condition of his cervical spine.  I believe his MRI was cancelled also due to insurance.  Would it be possible to re-order the MRI now that the patient has new insurance?    Please let us know your thoughts and any guidance you may be able to provide us with would be greatly appreciated.  Dierdre HighmanAudra F Treyce Spillers, PT, DPT 12/24/17    8:44 PM

## 2017-12-26 NOTE — Telephone Encounter (Signed)
The patient never had cervical spine MRI previously due to insurance issues.  He now has medical insurance, I will reorder the MRI of the cervical spine.

## 2017-12-30 ENCOUNTER — Encounter: Payer: Self-pay | Admitting: Rehabilitation

## 2017-12-30 ENCOUNTER — Ambulatory Visit: Payer: BLUE CROSS/BLUE SHIELD | Admitting: Rehabilitation

## 2017-12-30 DIAGNOSIS — R42 Dizziness and giddiness: Secondary | ICD-10-CM | POA: Diagnosis not present

## 2017-12-30 DIAGNOSIS — R262 Difficulty in walking, not elsewhere classified: Secondary | ICD-10-CM | POA: Diagnosis not present

## 2017-12-30 DIAGNOSIS — M5412 Radiculopathy, cervical region: Secondary | ICD-10-CM

## 2017-12-30 DIAGNOSIS — M542 Cervicalgia: Secondary | ICD-10-CM

## 2017-12-30 DIAGNOSIS — R2681 Unsteadiness on feet: Secondary | ICD-10-CM | POA: Diagnosis not present

## 2017-12-30 DIAGNOSIS — R208 Other disturbances of skin sensation: Secondary | ICD-10-CM | POA: Diagnosis not present

## 2017-12-30 DIAGNOSIS — M6281 Muscle weakness (generalized): Secondary | ICD-10-CM | POA: Diagnosis not present

## 2017-12-30 NOTE — Patient Instructions (Addendum)
Active Neck Rotation    Do this lying down with single pillow if possible to keep neck neutral.   With head in a comfortable position and chin gently tucked in, rotate head to the right. Hold __3-5__ seconds. Repeat to the left.  Go into slight discomfort but not pain.  Repeat _10___ times. Do __2__ sessions per day.  http://gt2.exer.us/10   Copyright  VHI. All rights reserved.   Pectoralis Stretch: Supine (Towel Roll)    Lie with rolled towel under spine, letting shoulders relax toward floor.  Place arms out to your side to make a "T" shape with your body.  Palms up.   Lie _1-2__ minutes. Do _2-3 times per day.  http://ss.exer.us/354   Copyright  VHI. All rights reserved.   Neurovascular: Median Nerve Glide With Elbow Bias - Supine    Lie with neck supported. Hold left arm out to the side, keep elbow straight, palm up.  I want you to pump wrist from bent to extended position until you feel stretch (will be slightly uncomfortable).  Work into Scientist, research (life sciences)holding wrist extended for longer if you can.   Repeat __10__ times per set. Do _1___ sets per session. Do __5-7__ sessions per week.  Copyright  VHI. All rights reserved.   Neurovascular: Ulnar Nerve Glide With Wrist Bias - Supine    Lie with neck supported, right arm out to side, elbow bent to pain-free position, palm out. Bend wrist, fingers pointing toward ear, as far as possible without pain.  Work on starting to hold longer (can decrease reps to 5 if holding 15-20 secs)  Repeat __10__ times per set. Do _1___ sets per session. Do __5-7__ sessions per week.  Copyright  VHI. All rights reserved.   Axial Extension (Chin Tuck)    Do this lying down on single pillow.  Pull chin in and lengthen back of neck. Hold _3-5___ seconds while counting out loud. Repeat __10__ times. Do __2__ sessions per day.  http://gt2.exer.us/449   Copyright  VHI. All rights reserved.

## 2017-12-30 NOTE — Therapy (Addendum)
[facility] [address] [place], [place], [address] Phone: [phone]   Fax:  [phone]  Physical Therapy Treatment  Patient Details  Name: Herbert Davis MRN: [id number] Date of Birth: 1981/01/12 Referring Provider: Kathrynn Ducking, MD   Encounter Date: 12/30/2017    12/30/17 1247  PT Visits / Re-Eval  Visit Number 3  Number of Visits 10  Date for PT Re-Evaluation 02/21/18  Authorization  Authorization Type BCBS  PT Time Calculation  PT Start Time 1102  PT Stop Time 1147  PT Time Calculation (min) 45 min  PT - End of Session  Activity Tolerance Patient limited by pain  Behavior During Therapy Flat affect   History reviewed. No pertinent past medical history.  History reviewed. No pertinent surgical history.  There were no vitals filed for this visit.  Subjective Assessment - 12/30/17 1107    Subjective  Returns after obtaining new insurance.  About the same, having good days and bad days.  Today is a bad day.  Increased pain in L side of neck.  Mild dizziness.    Pertinent History  no PMH    Limitations  Walking    Diagnostic tests  Per neurology notes: The patient has had a normal MRI of the brain, normal audiometric testing, normal ENG evaluation    Patient Stated Goals  Get rid of the dizziness; can deal with neck pain and tingling    Currently in Pain?  Yes    Pain Score  7     Pain Location  Neck    Pain Orientation  Left    Pain Descriptors / Indicators  Numbness;Discomfort    Pain Type  Chronic pain;Neuropathic pain    Pain Onset  More than a month ago    Pain Frequency  Intermittent    Aggravating Factors   Not sure    Pain Relieving Factors  nothing               See pt instruction for therex details.          [facility] Adult PT Treatment/Exercise - 12/30/17 1248      Balance   Balance Assessed  Yes      Standardized Balance Assessment   Standardized Balance Assessment   Dynamic Gait Index Did entire test with use of cane      Dynamic Gait Index   Level Surface  Mild Impairment    Change in Gait Speed  Moderate Impairment    Gait with Horizontal Head Turns  Severe Impairment    Gait with Vertical Head Turns  Severe Impairment    Gait and Pivot Turn  Mild Impairment    Step Over Obstacle  Mild Impairment    Step Around Obstacles  Mild Impairment    Steps  Moderate Impairment    Total Score  10             PT Education - 12/30/17 1246    Education provided  Yes    Education Details  HEP for neck, nerve glides, following up with MD on getting MRI of neck     Person(s) Educated  Patient    Methods  Explanation;Demonstration;Handout    Comprehension  Verbalized understanding;Returned demonstration       PT Short Term Goals - 12/30/17 1254      PT SHORT TERM GOAL #1   Title  Pt will tolerate full vestibular assessment and assessment of FGA    Baseline  Performed DGI with score of 10/24 on 12/30/17    Time  4    Period  Weeks    Status  Partially Met      PT SHORT TERM GOAL #2   Title  Pt will tolerate gentle cervical manual therapy and will demonstrate 5-8 deg increase in cervical flexion, extension, side bending and rotation     Baseline  See evaluation flow sheet for exact ROM measures    Time  4    Period  Weeks    Status  On-going      PT SHORT TERM GOAL #3   Title  Pt will demonstrate improvement in LUE strength from 4/5 to 4+/5 for shoulder elevation, flexion, elbow flex/ext, wrist flex/ext; will improve grip strength by 5 kg    Baseline  see flow sheet for eval measurements    Time  4    Period  Weeks    Status  On-going      PT SHORT TERM GOAL #4   Title  Will report 25% improvement in neck pain and episodes of L 4th/5th finger tingling    Time  4    Period  Weeks    Status  On-going      PT SHORT TERM GOAL #5   Title  Will tolerate seated vestibular habituation and gaze adapatation exercises with <5/10 dizziness    Time   4    Period  Weeks    Status  On-going      Additional Short Term Goals   Additional Short Term Goals  Yes      PT SHORT TERM GOAL #6   Title  Will improve gait velocity to >/= 1.9 ft/sec with cane to decrease risk for falls in home environment with more narrow BOS and increased arm swing    Baseline  1.75 with decreased arm swing and wide BOS    Time  4    Period  Weeks    Status  On-going      PT SHORT TERM GOAL #7   Title  Pt will improve DGI score to 13/24 in order to indicate decreased fall risk.      Time  4    Period  Weeks    Status  New        PT Long Term Goals - 12/30/17 1256      PT LONG TERM GOAL #1   Title  Pt will demonstrate independence with neck ROM, LUE strengthening, vestibular, standing balance HEP    Time  8    Period  Weeks    Status  On-going      PT LONG TERM GOAL #2   Title  Pt will report 50% improvement in dizziness with head movements, turning and with gait and will tolerate gaze adaptation exercises in standing (x1 viewing).    Time  8    Period  Weeks    Status  On-going      PT LONG TERM GOAL #3   Title  Pt will improve gait velocity to >/=2.6 ft/sec without AD for improved safety with gait in community    Time  8    Period  Weeks    Status  On-going      PT LONG TERM GOAL #4   Title  Pt will improve DGI by 6 points to decrease risk for falls    Baseline  10/24 at baseline 12/30/17    Time  8    Period  Weeks    Status  Revised      PT LONG TERM GOAL #5   Title  Pt will improve neck pain/tingling to <2/10 overall and will increase cervical ROM by 10-12 deg     Baseline  see flow sheet for updated ROM     Time  8    Period  Weeks    Status  On-going      PT LONG TERM GOAL #6   Title  Pt will ambulate x 250' over indoor and outdoor paved surfaces without cane at MOD I level; will report return to driving short distances along well known routes.    Time  8    Period  Weeks    Status  On-going            Plan - 12/30/17  1247    Clinical Impression Statement  Pt continues to report some good days and some bad days, today being a "bad" day with increased pain and dizziness.  Assessed DGI as PT felt FGA would be unsafe at this time.  Pt with score of 10/24 which indicates high fall risk.  Also focused on initiating cervical HEP as well as nerve glides to reduce neuropathic pain.      Rehab Potential  Good    PT Frequency  1x / week    PT Duration  8 weeks    PT Treatment/Interventions  ADLs/Self Care Home Management;Canalith Repostioning;Cryotherapy;Moist Heat;Traction;DME Instruction;Gait training;Stair training;Functional mobility training;Therapeutic exercise;Therapeutic activities;Balance training;Neuromuscular re-education;Patient/family education;Manual techniques;Passive range of motion;Dry needling;Taping;Vestibular    PT Next Visit Plan  check cervical HEP.  Finish assessment of motion sensitivity.  Sit <> stand with more narrow BOS.  Balance training.  X 1 viewing for habituation    PT Home Exercise Plan  LUE strengthening/grip strengthening, neck ROM, habituation, gaze adaptation, standing balance/gait    Consulted and Agree with Plan of Care  Patient       Patient will benefit from skilled therapeutic intervention in order to improve the following deficits and impairments:  Abnormal gait, Decreased activity tolerance, Decreased balance, Decreased endurance, Decreased mobility, Decreased range of motion, Decreased strength, Difficulty walking, Dizziness, Impaired sensation, Postural dysfunction, Pain  Visit Diagnosis: Cervical radiculopathy  Cervicalgia  Unsteadiness on feet     Problem List There are no active problems to display for this patient.   Cameron Sprang, PT, MPT [facility] [address] [place], [place], [address] Phone: [phone]   Fax:  [phone] 12/30/17, 12:58 PM  Name: TASHI BAND MRN: [id number] Date of Birth:  07/28/1981

## 2018-01-06 ENCOUNTER — Ambulatory Visit: Payer: BLUE CROSS/BLUE SHIELD | Admitting: Physical Therapy

## 2018-01-13 ENCOUNTER — Encounter: Payer: Self-pay | Admitting: Physical Therapy

## 2018-01-13 ENCOUNTER — Ambulatory Visit: Payer: BLUE CROSS/BLUE SHIELD | Admitting: Physical Therapy

## 2018-01-13 DIAGNOSIS — R208 Other disturbances of skin sensation: Secondary | ICD-10-CM | POA: Diagnosis not present

## 2018-01-13 DIAGNOSIS — R262 Difficulty in walking, not elsewhere classified: Secondary | ICD-10-CM | POA: Diagnosis not present

## 2018-01-13 DIAGNOSIS — M6281 Muscle weakness (generalized): Secondary | ICD-10-CM | POA: Diagnosis not present

## 2018-01-13 DIAGNOSIS — R42 Dizziness and giddiness: Secondary | ICD-10-CM

## 2018-01-13 DIAGNOSIS — M542 Cervicalgia: Secondary | ICD-10-CM | POA: Diagnosis not present

## 2018-01-13 DIAGNOSIS — R2681 Unsteadiness on feet: Secondary | ICD-10-CM | POA: Diagnosis not present

## 2018-01-13 DIAGNOSIS — M5412 Radiculopathy, cervical region: Secondary | ICD-10-CM | POA: Diagnosis not present

## 2018-01-13 NOTE — Therapy (Signed)
[facility] [address] [place], [place], [address] Phone: [phone]   Fax:  [phone]  Physical Therapy Treatment  Patient Details  Name: Herbert Davis MRN: [id number] Date of Birth: 11/07/1980 Referring Provider: Kathrynn Ducking, MD   Encounter Date: 01/13/2018  PT End of Session - 01/13/18 1216    Visit Number  4    Number of Visits  10    Date for PT Re-Evaluation  02/21/18    Authorization Type  BCBS: $20 copay; VL combined with PT, OT, Chiro, in home.  30 VL    Authorization - Visit Number  3    Authorization - Number of Visits  30    PT Start Time  1110    PT Stop Time  1155    PT Time Calculation (min)  45 min    Activity Tolerance  Patient tolerated treatment well;Patient limited by pain    Behavior During Therapy  Flat affect       History reviewed. No pertinent past medical history.  History reviewed. No pertinent surgical history.  There were no vitals filed for this visit.  Subjective Assessment - 01/13/18 1115    Subjective  Pt feeling better overall; feels like the exercises are helping and he is happy to see some progress.  Stretches have decreased the tingling in the LUE.  Still having some dizziness with looking up.  Has not heard from Neurologist about MRI; will contact him to f/u.    Pertinent History  no PMH    Limitations  Walking    Diagnostic tests  Per neurology notes: The patient has had a normal MRI of the brain, normal audiometric testing, normal ENG evaluation    Patient Stated Goals  Get rid of the dizziness; can deal with neck pain and tingling    Currently in Pain?  Yes    Pain Score  3     Pain Location  Neck    Pain Orientation  Left    Pain Descriptors / Indicators  Tingling    Pain Type  Neuropathic pain    Pain Onset  More than a month ago             Vestibular Assessment - 01/13/18 1143      Positional Sensitivities   Head Turning x 5  Moderate  dizziness    Head Nodding x 5  Vomiting queasy               [facility] Adult PT Treatment/Exercise - 01/13/18 1139      Exercises   Exercises  Neck      Neck Exercises: Supine   Cervical Isometrics  Right rotation;Left rotation;5 secs;10 reps    Cervical Isometrics Limitations  manual resistance from therapist for rotation    Neck Retraction  10 reps;5 secs      Manual Therapy   Manual Therapy  Joint mobilization;Manual Traction    Joint Mobilization  bilat first rib depression and mobilization.  Also with lower C-spine blocked performed C0-C1 grade 1-2 mobilization for suboccipital release x 3 reps x 30 seconds each    Manual Traction  x 4 reps after each exercise to relieve tension and tingling down LUE      Vestibular Treatment/Exercise - 01/13/18 1147      Vestibular Treatment/Exercise   Vestibular Treatment Provided  Gaze    Gaze Exercises  X1 Viewing Horizontal;X1 Viewing Vertical      X1 Viewing Horizontal  Foot Position  seated without and then with back support    Reps  3    Comments  reported swaying and increased dizziness      X1 Viewing Vertical   Foot Position  unable to perform at this time            PT Education - 01/13/18 1216    Education provided  Yes    Education Details  continue HEP for neck; will not initiate gaze adaptation exercises today, continued to stress importance of contacting neurologist for appointment for MRI    Person(s) Educated  Patient    Methods  Explanation    Comprehension  Verbalized understanding       PT Short Term Goals - 12/30/17 1254      PT SHORT TERM GOAL #1   Title  Pt will tolerate full vestibular assessment and assessment of FGA    Baseline  Performed DGI with score of 10/24 on 12/30/17    Time  4    Period  Weeks    Status  Partially Met    Target Date  01/22/18      PT SHORT TERM GOAL #2   Title  Pt will tolerate gentle cervical manual therapy and will demonstrate 5-8 deg increase in cervical  flexion, extension, side bending and rotation     Baseline  See evaluation flow sheet for exact ROM measures    Time  4    Period  Weeks    Status  On-going    Target Date  01/22/18      PT SHORT TERM GOAL #3   Title  Pt will demonstrate improvement in LUE strength from 4/5 to 4+/5 for shoulder elevation, flexion, elbow flex/ext, wrist flex/ext; will improve grip strength by 5 kg    Baseline  see flow sheet for eval measurements    Time  4    Period  Weeks    Status  On-going    Target Date  01/22/18      PT SHORT TERM GOAL #4   Title  Will report 25% improvement in neck pain and episodes of L 4th/5th finger tingling    Time  4    Period  Weeks    Status  On-going    Target Date  01/22/18      PT SHORT TERM GOAL #5   Title  Will tolerate seated vestibular habituation and gaze adapatation exercises with <5/10 dizziness    Time  4    Period  Weeks    Status  On-going    Target Date  01/22/18      Additional Short Term Goals   Additional Short Term Goals  Yes      PT SHORT TERM GOAL #6   Title  Will improve gait velocity to >/= 1.9 ft/sec with cane to decrease risk for falls in home environment with more narrow BOS and increased arm swing    Baseline  1.75 with decreased arm swing and wide BOS    Time  4    Period  Weeks    Status  On-going    Target Date  01/22/18      PT SHORT TERM GOAL #7   Title  Pt will improve DGI score to 13/24 in order to indicate decreased fall risk.      Time  4    Period  Weeks    Status  New    Target Date  01/22/18  PT Long Term Goals - 12/30/17 1256      PT LONG TERM GOAL #1   Title  Pt will demonstrate independence with neck ROM, LUE strengthening, vestibular, standing balance HEP    Time  8    Period  Weeks    Status  On-going    Target Date  02/21/18      PT LONG TERM GOAL #2   Title  Pt will report 50% improvement in dizziness with head movements, turning and with gait and will tolerate gaze adaptation exercises in  standing (x1 viewing).    Time  8    Period  Weeks    Status  On-going    Target Date  02/21/18      PT LONG TERM GOAL #3   Title  Pt will improve gait velocity to >/=2.6 ft/sec without AD for improved safety with gait in community    Time  8    Period  Weeks    Status  On-going    Target Date  02/21/18      PT LONG TERM GOAL #4   Title  Pt will improve DGI by 6 points to decrease risk for falls    Baseline  10/24 at baseline 12/30/17    Time  8    Period  Weeks    Status  Revised    Target Date  02/21/18      PT LONG TERM GOAL #5   Title  Pt will improve neck pain/tingling to <2/10 overall and will increase cervical ROM by 10-12 deg     Baseline  see flow sheet for updated ROM     Time  8    Period  Weeks    Status  On-going    Target Date  02/21/18      PT LONG TERM GOAL #6   Title  Pt will ambulate x 250' over indoor and outdoor paved surfaces without cane at MOD I level; will report return to driving short distances along well known routes.    Time  8    Period  Weeks    Status  On-going    Target Date  02/21/18            Plan - 01/13/18 1217    Clinical Impression Statement  Pt demonstrating improvement in pain, head position/posture and balance during gait with more normal width of BOS at beginning of session.  Continued gentle manual therapy to cervical spine adding in gentle isometric strengthening and mobilization of suboccipital space.  Following manual therapy pt reported increase in pain to 5/10; applied heat to neck/shoulders which decreased pain to 4/10 by end of session.  While heat applied performed MSQ assesment of head nods and head turns.  Attempted to initiate gaze adaptation with horizontal head turns in sitting without and then with back support but pt reporting severe dizziness after 3-4 repetitions.  Will not add gaze adaptation to HEP at this time, will continue to focus on neck and habituation and slowly progress to gaze adaptation.    Rehab  Potential  Good    PT Frequency  1x / week    PT Duration  8 weeks    PT Treatment/Interventions  ADLs/Self Care Home Management;Canalith Repostioning;Cryotherapy;Moist Heat;Traction;DME Instruction;Gait training;Stair training;Functional mobility training;Therapeutic exercise;Therapeutic activities;Balance training;Neuromuscular re-education;Patient/family education;Manual techniques;Passive range of motion;Dry needling;Taping;Vestibular    PT Next Visit Plan  Did he call about MRI? open to OT? Sit <> stand with more narrow BOS.  Balance  training.  X 1 viewing for habituation supported sitting, UE strengthening    Consulted and Agree with Plan of Care  Patient       Patient will benefit from skilled therapeutic intervention in order to improve the following deficits and impairments:  Abnormal gait, Decreased activity tolerance, Decreased balance, Decreased endurance, Decreased mobility, Decreased range of motion, Decreased strength, Difficulty walking, Dizziness, Impaired sensation, Postural dysfunction, Pain  Visit Diagnosis: Cervicalgia  Unsteadiness on feet  Dizziness and giddiness  Difficulty in walking, not elsewhere classified  Other disturbances of skin sensation  Muscle weakness (generalized)     Problem List There are no active problems to display for this patient.   Rico Junker, PT, DPT 01/13/18    12:26 PM    [facility] [address] [place], [place], [address] Phone: [phone]   Fax:  [phone]  Name: Herbert Davis MRN: [id number] Date of Birth: 1981/06/15

## 2018-01-20 ENCOUNTER — Telehealth: Payer: Self-pay

## 2018-01-20 ENCOUNTER — Ambulatory Visit: Payer: BLUE CROSS/BLUE SHIELD | Attending: Neurology

## 2018-01-20 DIAGNOSIS — M542 Cervicalgia: Secondary | ICD-10-CM | POA: Diagnosis not present

## 2018-01-20 DIAGNOSIS — R262 Difficulty in walking, not elsewhere classified: Secondary | ICD-10-CM | POA: Diagnosis not present

## 2018-01-20 DIAGNOSIS — R2681 Unsteadiness on feet: Secondary | ICD-10-CM | POA: Insufficient documentation

## 2018-01-20 DIAGNOSIS — M6281 Muscle weakness (generalized): Secondary | ICD-10-CM | POA: Insufficient documentation

## 2018-01-20 DIAGNOSIS — R208 Other disturbances of skin sensation: Secondary | ICD-10-CM | POA: Diagnosis not present

## 2018-01-20 DIAGNOSIS — R42 Dizziness and giddiness: Secondary | ICD-10-CM | POA: Insufficient documentation

## 2018-01-20 DIAGNOSIS — R29898 Other symptoms and signs involving the musculoskeletal system: Secondary | ICD-10-CM

## 2018-01-20 NOTE — Telephone Encounter (Signed)
Dr. Anne Hahn,  [name] been seeing Mr. Herbert Davis for dizziness and neck pain. We feel that he would benefit from an OT eval based  On his L UE impairments.  If you agree, please send [place] a referral.  Thank you, Zerita Boers, PT,DPT 01/20/18 12:16 PM Phone: [phone] Fax: [phone]

## 2018-01-20 NOTE — Telephone Encounter (Signed)
I have sent in a referral for occupational therapy

## 2018-01-20 NOTE — Therapy (Signed)
[facility] [address] [place], [place], [address] Phone: [phone]   Fax:  [phone]  Physical Therapy Treatment  Patient Details  Name: Herbert Davis MRN: [id number] Date of Birth: 1980/09/23 Referring Provider: Kathrynn Ducking, MD   Encounter Date: 01/20/2018  PT End of Session - 01/20/18 1157    Visit Number  5    Number of Visits  10    Date for PT Re-Evaluation  02/21/18    Authorization Type  BCBS: $20 copay; VL combined with PT, OT, Chiro, in home.  30 VL    Authorization - Visit Number  4    Authorization - Number of Visits  30    PT Start Time  2423    PT Stop Time  1148    PT Time Calculation (min)  45 min    Equipment Utilized During Treatment  Gait belt    Activity Tolerance  Patient tolerated treatment well;Other (comment) lmited by dizziness    Behavior During Therapy  Flat affect       History reviewed. No pertinent past medical history.  History reviewed. No pertinent surgical history.  There were no vitals filed for this visit.  Subjective Assessment - 01/20/18 1106    Subjective  Pt denied falls or changes since last visit. Pt feels he's about 20-30% overall better. Pt reports 2-3/10 dizziness at rest. Pt hasn't set up MRI yet, as he wants to determine the out-of-pocket cost. Pt is open to having OT, and states he would like the referral from Dr. Jannifer Franklin for OT. Pt report neck pain and L 4-5 digit N/T is 30-40% better. Pt reported he felt slightly better or about the same after manual therapy last session.     Pertinent History  no PMH    Limitations  Walking    Diagnostic tests  Per neurology notes: The patient has had a normal MRI of the brain, normal audiometric testing, normal ENG evaluation    Patient Stated Goals  Get rid of the dizziness; can deal with neck pain and tingling    Currently in Pain?  Yes    Pain Score  -- 4-5/10    Pain Location  Neck    Pain Orientation  Left    Pain Descriptors / Indicators  Tingling;Aching    Pain Type  Neuropathic pain    Pain Onset  More than a month ago    Pain Frequency  Intermittent    Aggravating Factors   Sudden movement     Pain Relieving Factors  heat         [facility] PT Assessment - 01/20/18 1123      ROM / Strength   AROM / PROM / Strength  Strength;AROM      AROM   AROM Assessment Site  Cervical    Cervical Flexion  44 with eyes closed to not provoke dizziness, no incr. in pain    Cervical Extension  38, eyes closed to not provoke dizziness with 4/10 neck pain    Cervical - Right Side Bend  42, dizziness 5/10 and tightness but no incr. in pain    Cervical - Left Side Bend  41, dizziness 5/10 and 4-5/10 neck pain to L shoulder    Cervical - Right Rotation  77, no dizziness, only neck tightness    Cervical - Left Rotation  49, 4-5/10 pain and dizziness 2-3/10      Strength   Overall Strength  Deficits  Right/Left Shoulder  Left    Left Shoulder Flexion  4/5    Left Shoulder Extension  4/5    Left Shoulder ABduction  4+/5    Left Shoulder Internal Rotation  4/5    Left Shoulder External Rotation  4/5    Left Elbow Flexion  4/5    Left Elbow Extension  4+/5    Left Wrist Flexion  4/5    Left Wrist Extension  4+/5    Left Hand Grip (lbs)  38 kg with elbow tucked in 90 degrees flexion at pt's side, with 90 degrees of shoulder flexion: 31 kg                   [facility] Adult PT Treatment/Exercise - 01/20/18 1110      Ambulation/Gait   Ambulation/Gait  Yes    Ambulation/Gait Assistance  4: Min guard    Ambulation Distance (Feet)  100 Feet    Assistive device  Straight cane    Gait Pattern  Step-through pattern;Decreased arm swing - right;Decreased arm swing - left;Decreased weight shift to right;Decreased weight shift to left;Decreased trunk rotation;Wide base of support    Ambulation Surface  Level;Indoor    Gait velocity  2.66f/sec. and 2.031fsec [facility]     Standardized Balance  Assessment   Standardized Balance Assessment  Dynamic Gait Index      Dynamic Gait Index   Level Surface  Mild Impairment    Change in Gait Speed  Moderate Impairment    Gait with Horizontal Head Turns  Severe Impairment    Gait with Vertical Head Turns  Severe Impairment    Gait and Pivot Turn  Mild Impairment    Step Over Obstacle  Mild Impairment    Step Around Obstacles  Mild Impairment    Steps  Moderate Impairment    Total Score  10             PT Education - 01/20/18 1157    Education provided  Yes    Education Details  PT discussed goal progress and outcome measures. PT reiterated the importance of continuing HEP at home and to notify PT when exercises become too easy. PT encouraged pt to schedule MRI.     Person(s) Educated  Patient    Methods  Explanation    Comprehension  Verbalized understanding       PT Short Term Goals - 01/20/18 1210      PT SHORT TERM GOAL #1   Title  Pt will tolerate full vestibular assessment and assessment of FGA    Baseline  Performed DGI with score of 10/24 on 12/30/17    Time  4    Period  Weeks    Status  Partially Met      PT SHORT TERM GOAL #2   Title  Pt will tolerate gentle cervical manual therapy and will demonstrate 5-8 deg increase in cervical flexion, extension, side bending and rotation     Baseline  See evaluation flow sheet for exact ROM measures    Time  4    Period  Weeks    Status  Partially Met      PT SHORT TERM GOAL #3   Title  Pt will demonstrate improvement in LUE strength from 4/5 to 4+/5 for shoulder elevation, flexion, elbow flex/ext, wrist flex/ext; will improve grip strength by 5 kg    Baseline  see flow sheet for eval measurements    Time  4  Period  Weeks    Status  Partially Met      PT SHORT TERM GOAL #4   Title  Will report 25% improvement in neck pain and episodes of L 4th/5th finger tingling    Time  4    Period  Weeks    Status  Achieved      PT SHORT TERM GOAL #5   Title  Will  tolerate seated vestibular habituation and gaze adapatation exercises with <5/10 dizziness    Time  4    Period  Weeks    Status  Partially Met      PT SHORT TERM GOAL #6   Title  Will improve gait velocity to >/= 1.9 ft/sec with cane to decrease risk for falls in home environment with more narrow BOS and increased arm swing    Time  4    Period  Weeks    Status  Achieved      PT SHORT TERM GOAL #7   Title  Pt will improve DGI score to 13/24 in order to indicate decreased fall risk.      Time  4    Period  Weeks    Status  Not Met        PT Long Term Goals - 12/30/17 1256      PT LONG TERM GOAL #1   Title  Pt will demonstrate independence with neck ROM, LUE strengthening, vestibular, standing balance HEP    Time  8    Period  Weeks    Status  On-going    Target Date  02/21/18      PT LONG TERM GOAL #2   Title  Pt will report 50% improvement in dizziness with head movements, turning and with gait and will tolerate gaze adaptation exercises in standing (x1 viewing).    Time  8    Period  Weeks    Status  On-going    Target Date  02/21/18      PT LONG TERM GOAL #3   Title  Pt will improve gait velocity to >/=2.6 ft/sec without AD for improved safety with gait in community    Time  8    Period  Weeks    Status  On-going    Target Date  02/21/18      PT LONG TERM GOAL #4   Title  Pt will improve DGI by 6 points to decrease risk for falls    Baseline  10/24 at baseline 12/30/17    Time  8    Period  Weeks    Status  Revised    Target Date  02/21/18      PT LONG TERM GOAL #5   Title  Pt will improve neck pain/tingling to <2/10 overall and will increase cervical ROM by 10-12 deg     Baseline  see flow sheet for updated ROM     Time  8    Period  Weeks    Status  On-going    Target Date  02/21/18      PT LONG TERM GOAL #6   Title  Pt will ambulate x 250' over indoor and outdoor paved surfaces without cane at MOD I level; will report return to driving short distances  along well known routes.    Time  8    Period  Weeks    Status  On-going    Target Date  02/21/18  Plan - 01/20/18 1158    Clinical Impression Statement  Pt met STG 4 and 6 met. Pt partially met STGs 2 and 3. Pt did not meet STG 7. Despite not meeting STG 5, pt did report decr. dizziness during rolling and horizontal head turns (3/10) but 7/10 dizziness during vertical head turns. Cx AROM improved except for B sidebending and the following LUE strength improved: wrist ext, shoulder ext, and shoulder abd. Pt continues to require rest breaks 2/2 dizziness. Pt would continue to benefit from skilled PT to improve safety during functional mobility.     Rehab Potential  Good    PT Frequency  1x / week    PT Duration  8 weeks    PT Treatment/Interventions  ADLs/Self Care Home Management;Canalith Repostioning;Cryotherapy;Moist Heat;Traction;DME Instruction;Gait training;Stair training;Functional mobility training;Therapeutic exercise;Therapeutic activities;Balance training;Neuromuscular re-education;Patient/family education;Manual techniques;Passive range of motion;Dry needling;Taping;Vestibular    PT Next Visit Plan  Did he call about MRI-pt stated he's trying to determine cost? See if OT referral sent over. Assess HEP and progress as tolerated.  Sit <> stand with more narrow BOS.  Balance training.  X 1 viewing for habituation supported sitting, UE strengthening    Consulted and Agree with Plan of Care  Patient       Patient will benefit from skilled therapeutic intervention in order to improve the following deficits and impairments:  Abnormal gait, Decreased activity tolerance, Decreased balance, Decreased endurance, Decreased mobility, Decreased range of motion, Decreased strength, Difficulty walking, Dizziness, Impaired sensation, Postural dysfunction, Pain  Visit Diagnosis: Cervicalgia  Dizziness and giddiness  Unsteadiness on feet     Problem List There are no active  problems to display for this patient.   Kylia Grajales L 01/20/2018, 12:12 PM  [facility] [address] [place], [place], [address] Phone: [phone]   Fax:  [phone]  Name: FRANCE NOYCE MRN: [id number] Date of Birth: 1981/08/11  Geoffry Paradise, PT,DPT 01/20/18 12:14 PM Phone: [phone] Fax: [phone]

## 2018-01-27 ENCOUNTER — Encounter: Payer: Self-pay | Admitting: Physical Therapy

## 2018-01-27 ENCOUNTER — Ambulatory Visit: Payer: BLUE CROSS/BLUE SHIELD | Admitting: Physical Therapy

## 2018-01-27 ENCOUNTER — Ambulatory Visit: Payer: Self-pay | Admitting: Physical Therapy

## 2018-01-27 DIAGNOSIS — R42 Dizziness and giddiness: Secondary | ICD-10-CM | POA: Diagnosis not present

## 2018-01-27 DIAGNOSIS — M6281 Muscle weakness (generalized): Secondary | ICD-10-CM

## 2018-01-27 DIAGNOSIS — R208 Other disturbances of skin sensation: Secondary | ICD-10-CM | POA: Diagnosis not present

## 2018-01-27 DIAGNOSIS — R2681 Unsteadiness on feet: Secondary | ICD-10-CM | POA: Diagnosis not present

## 2018-01-27 DIAGNOSIS — M542 Cervicalgia: Secondary | ICD-10-CM

## 2018-01-27 DIAGNOSIS — R262 Difficulty in walking, not elsewhere classified: Secondary | ICD-10-CM

## 2018-01-27 NOTE — Therapy (Signed)
[facility] [address] [place], [place], [address] Phone: [phone]   Fax:  [phone]  Physical Therapy Treatment  Patient Details  Name: Herbert Davis MRN: [id number] Date of Birth: 1980-10-12 Referring Provider: Kathrynn Ducking, MD   Encounter Date: 01/27/2018  PT End of Session - 01/27/18 1222    Visit Number  6    Number of Visits  10    Date for PT Re-Evaluation  02/21/18    Authorization Type  [employer]: $20 copay; VL combined with PT, OT, Chiro, in home.  30 VL    Authorization - Visit Number  5    Authorization - Number of Visits  30    PT Start Time  8850    PT Stop Time  1145    PT Time Calculation (min)  41 min    Equipment Utilized During Treatment  Gait belt    Activity Tolerance  Patient tolerated treatment well;Other (comment) lmited by dizziness    Behavior During Therapy  [facility] for tasks assessed/performed       History reviewed. No pertinent past medical history.  History reviewed. No pertinent surgical history.  There were no vitals filed for this visit.  Subjective Assessment - 01/27/18 1110    Subjective  No issues with exercises; side to side is getting better, up and down has not improved, still very difficult to look up-still very dizzy.  Has not been able to set up MRI yet due to finances.  "On scale of 0-10 how important is it to do the MRI?"  The tingling is decreasing.    Pertinent History  no PMH    Limitations  Walking    Diagnostic tests  Per neurology notes: The patient has had a normal MRI of the brain, normal audiometric testing, normal ENG evaluation    Patient Stated Goals  Get rid of the dizziness; can deal with neck pain and tingling    Currently in Pain?  Yes    Pain Score  2     Pain Location  Arm    Pain Orientation  Left    Pain Descriptors / Indicators  Tingling    Pain Type  Neuropathic pain    Pain Onset  More than a month ago                        [facility] Adult PT Treatment/Exercise - 01/27/18 1130      Transfers   Transfers  Sit to Stand    Sit to Stand  4: Min assist    Sit to Stand Details (indicate cue type and reason)  sit <> stand from mat with UE supported on cane in front focusing on full anterior lean with upright trunk posture and head in neutral; also cued to bring hips into neutral rotation for more normal BOS.  Performed x 8 reps with min A at head and trunk to maintain positioning      Therapeutic Activites    Therapeutic Activities  Other Therapeutic Activities    Other Therapeutic Activities  discussed MRI and due to lack of imaging therapy is not able to be as progressive with cervical manual therapy and MRI may help guide treatment options and interventions and help guide neurologist treatment.  Pt feels he is not able to afford it now.  Also discussed that because he is improving with treatment and symptoms are not getting worse therapy would continue with current  interventions but would continue to be more conservative with treatment due to lack of further diagnosis      Exercises   Exercises  Shoulder      Shoulder Exercises: Standing   Retraction  Both;10 reps;Other (comment)    Retraction Limitations  and head in neutral with thoracic spine leaning against physioball against wall and performing mini squats with normal width BOS to focus on self monitoring and correction of posture      Vestibular Treatment/Exercise - 01/27/18 1114      Vestibular Treatment/Exercise   Vestibular Treatment Provided  Gaze    Gaze Exercises  X1 Viewing Horizontal      X1 Viewing Horizontal   Foot Position  seated with back support    Reps  6    Comments  2 sets; minimal dizziness      X1 Viewing Vertical   Foot Position  seated with back support    Reps  2    Comments  from middle <> down (not up) - still reported significant dizziness         Balance Exercises - 01/27/18 1317       Balance Exercises: Standing   Standing Eyes Opened  Narrow base of support (BOS);Head turns;Solid surface;Other reps (comment) 10 sec head turns    Standing Eyes Closed  Wide (BOA);Solid surface;30 secs        PT Education - 01/27/18 1211    Education provided  Yes    Education Details  head/neck in neutral and posture during sit <> stand and standing balance exercises; x 1 viewing, corner balance exercises to initiate at home    Person(s) Educated  Patient    Methods  Explanation;Demonstration;Handout    Comprehension  Verbalized understanding;Returned demonstration       PT Short Term Goals - 01/20/18 1210      PT SHORT TERM GOAL #1   Title  Pt will tolerate full vestibular assessment and assessment of FGA    Baseline  Performed DGI with score of 10/24 on 12/30/17    Time  4    Period  Weeks    Status  Partially Met      PT SHORT TERM GOAL #2   Title  Pt will tolerate gentle cervical manual therapy and will demonstrate 5-8 deg increase in cervical flexion, extension, side bending and rotation     Baseline  See evaluation flow sheet for exact ROM measures    Time  4    Period  Weeks    Status  Partially Met      PT SHORT TERM GOAL #3   Title  Pt will demonstrate improvement in LUE strength from 4/5 to 4+/5 for shoulder elevation, flexion, elbow flex/ext, wrist flex/ext; will improve grip strength by 5 kg    Baseline  see flow sheet for eval measurements    Time  4    Period  Weeks    Status  Partially Met      PT SHORT TERM GOAL #4   Title  Will report 25% improvement in neck pain and episodes of L 4th/5th finger tingling    Time  4    Period  Weeks    Status  Achieved      PT SHORT TERM GOAL #5   Title  Will tolerate seated vestibular habituation and gaze adapatation exercises with <5/10 dizziness    Time  4    Period  Weeks    Status  Partially Met  PT SHORT TERM GOAL #6   Title  Will improve gait velocity to >/= 1.9 ft/sec with cane to decrease risk for  falls in home environment with more narrow BOS and increased arm swing    Time  4    Period  Weeks    Status  Achieved      PT SHORT TERM GOAL #7   Title  Pt will improve DGI score to 13/24 in order to indicate decreased fall risk.      Time  4    Period  Weeks    Status  Not Met        PT Long Term Goals - 12/30/17 1256      PT LONG TERM GOAL #1   Title  Pt will demonstrate independence with neck ROM, LUE strengthening, vestibular, standing balance HEP    Time  8    Period  Weeks    Status  On-going    Target Date  02/21/18      PT LONG TERM GOAL #2   Title  Pt will report 50% improvement in dizziness with head movements, turning and with gait and will tolerate gaze adaptation exercises in standing (x1 viewing).    Time  8    Period  Weeks    Status  On-going    Target Date  02/21/18      PT LONG TERM GOAL #3   Title  Pt will improve gait velocity to >/=2.6 ft/sec without AD for improved safety with gait in community    Time  8    Period  Weeks    Status  On-going    Target Date  02/21/18      PT LONG TERM GOAL #4   Title  Pt will improve DGI by 6 points to decrease risk for falls    Baseline  10/24 at baseline 12/30/17    Time  8    Period  Weeks    Status  Revised    Target Date  02/21/18      PT LONG TERM GOAL #5   Title  Pt will improve neck pain/tingling to <2/10 overall and will increase cervical ROM by 10-12 deg     Baseline  see flow sheet for updated ROM     Time  8    Period  Weeks    Status  On-going    Target Date  02/21/18      PT LONG TERM GOAL #6   Title  Pt will ambulate x 250' over indoor and outdoor paved surfaces without cane at MOD I level; will report return to driving short distances along well known routes.    Time  8    Period  Weeks    Status  On-going    Target Date  02/21/18            Plan - 01/27/18 1223    Clinical Impression Statement  Pt with improved tolerance to horizontal head turns but is still unable to tolerate  vertical head nods.  Able to progress pt to performing seated x 1 viewing and standing corner balance exercises for HEP.  Also educated pt on posture and head/neck neutral position and how to self monitor and correct during sit <> stand, gait and standing balance exercises.  Pt tolerated well, will continue to progress as pt is able to tolerate.      Rehab Potential  Good    PT Frequency  1x / week  PT Duration  8 weeks    PT Treatment/Interventions  ADLs/Self Care Home Management;Canalith Repostioning;Cryotherapy;Moist Heat;Traction;DME Instruction;Gait training;Stair training;Functional mobility training;Therapeutic exercise;Therapeutic activities;Balance training;Neuromuscular re-education;Patient/family education;Manual techniques;Passive range of motion;Dry needling;Taping;Vestibular    PT Next Visit Plan  SET UP OT.  progress x 1 viewing and standing balance in corner.  Manual therapy to neck/stretches.  Posture and postural awareness.  Gait with narrow BOS/without cane.      Consulted and Agree with Plan of Care  Patient       Patient will benefit from skilled therapeutic intervention in order to improve the following deficits and impairments:  Abnormal gait, Decreased activity tolerance, Decreased balance, Decreased endurance, Decreased mobility, Decreased range of motion, Decreased strength, Difficulty walking, Dizziness, Impaired sensation, Postural dysfunction, Pain  Visit Diagnosis: Cervicalgia  Dizziness and giddiness  Unsteadiness on feet  Difficulty in walking, not elsewhere classified  Muscle weakness (generalized)  Other disturbances of skin sensation     Problem List There are no active problems to display for this patient.  Rico Junker, PT, DPT 01/27/18    2:34 PM    [facility] [address] [place], [place], [address] Phone: [phone]   Fax:  [phone]  Name: MAKYA YURKO MRN:  [id number] Date of Birth: 1981-08-08

## 2018-01-27 NOTE — Patient Instructions (Addendum)
AROM: Lateral Neck Flexion    Slowly tilt head toward one shoulder, then the other. Hold each position __1-2__ seconds. Repeat __3-5__ times per side.  Do __2__ sessions per day.   Gaze Stabilization: Sitting    Keeping eyes on target on wall 3 feet away, and move head side to side for 6 repetitions. Do __2-3__ sessions per day.    Gaze Stabilization: Tip Card  1.Target must remain in focus, not blurry, and appear stationary while head is in motion. 2.Perform exercises with small head movements (45 to either side of midline). 3.Increase speed of head motion so long as target is in focus. 4.If you wear eyeglasses, be sure you can see target through lens (therapist will give specific instructions for bifocal / progressive lenses). 5.These exercises may provoke dizziness or nausea. Work through these symptoms. If too dizzy, slow head movement slightly. Rest between each exercise. 6.Exercises demand concentration; avoid distractions.  Copyright  VHI. All rights reserved.    Sit to Stand: Easy    Get ON TARGET  Both hands on cane in front for support, erect posture, chin tucked, feet on floor with feet in line with hips. Leaning at hips, shift weight forward. Push on cane to stand without extending the head back.  Repeat _10__ times. Do __2_ sessions per day.   Feet Together, Head Motion - Eyes Open    Standing with your back to a corner and chair in front for support.  With eyes open, feet together, move head slowly: side to side.   Repeat 10 times per session. Do 2 sessions per day.   Balance: Eyes Closed - Bilateral (Varied Surfaces)    Stand, feet shoulder width, close eyes, hands on the back of the chair for support. Maintain balance 10 seconds. Repeat 3 times per set.  Try to keep your weight in the middle of your feet.  Try to work up to holding for 30 seconds.

## 2018-02-03 ENCOUNTER — Ambulatory Visit: Payer: BLUE CROSS/BLUE SHIELD | Admitting: Physical Therapy

## 2018-02-03 DIAGNOSIS — R208 Other disturbances of skin sensation: Secondary | ICD-10-CM | POA: Diagnosis not present

## 2018-02-03 DIAGNOSIS — M542 Cervicalgia: Secondary | ICD-10-CM | POA: Diagnosis not present

## 2018-02-03 DIAGNOSIS — M6281 Muscle weakness (generalized): Secondary | ICD-10-CM | POA: Diagnosis not present

## 2018-02-03 DIAGNOSIS — R2681 Unsteadiness on feet: Secondary | ICD-10-CM

## 2018-02-03 DIAGNOSIS — R42 Dizziness and giddiness: Secondary | ICD-10-CM | POA: Diagnosis not present

## 2018-02-03 DIAGNOSIS — R262 Difficulty in walking, not elsewhere classified: Secondary | ICD-10-CM

## 2018-02-03 NOTE — Therapy (Signed)
[facility] [address] [place], [place], [address] Phone: [phone]   Fax:  [phone]  Physical Therapy Treatment  Patient Details  Name: Herbert Davis MRN: [id number] Date of Birth: 21-Dec-1980 Referring Provider: Kathrynn Ducking, MD   Encounter Date: 02/03/2018  PT End of Session - 02/03/18 1218    Visit Number  7    Number of Visits  10    Date for PT Re-Evaluation  02/21/18    Authorization Type  BCBS: $20 copay; VL combined with PT, OT, Chiro, in home.  30 VL    Authorization - Visit Number  6    Authorization - Number of Visits  30    PT Start Time  1106    PT Stop Time  1154    PT Time Calculation (min)  48 min    Activity Tolerance  Other (comment) lmited by dizziness    Behavior During Therapy  Flat affect;Anxious       No past medical history on file.  No past surgical history on file.  There were no vitals filed for this visit.  Subjective Assessment - 02/03/18 1112    Subjective  Pt ambulating with wider BOS and leaning to L letting LUE swing uncontrolled.  Pt reports having to slam on the brakes on the way here and it causing his neck to spasm; reporting some tingling in LUE but before the ride today, he has not had any tingling in a week.  Pain was a 1/10, has increased to 5/10.      Pertinent History  no PMH    Limitations  Walking    Diagnostic tests  Per neurology notes: The patient has had a normal MRI of the brain, normal audiometric testing, normal ENG evaluation    Patient Stated Goals  Get rid of the dizziness; can deal with neck pain and tingling    Currently in Pain?  Yes    Pain Score  5     Pain Location  Neck    Pain Orientation  Left    Pain Descriptors / Indicators  Tingling    Pain Onset  More than a month ago                       [facility] Adult PT Treatment/Exercise - 02/03/18 1131      Ambulation/Gait   Ambulation/Gait  Yes    Ambulation/Gait  Assistance  3: Mod assist    Ambulation/Gait Assistance Details  Adjusted cane up one level to decrease amount of R lateral flexion and elbow hyperextension in RUE during gait; also educated pt on placement of cane closer to BOS to allow more upright posture (decreased forward flexion onto cane).  During gait provided moderate facilitation and cues for shoulder depression, upright trunk and lateral weight shifting, to maintain a more neutral BOS width and for full step length RLE    Ambulation Distance (Feet)  115 Feet    Assistive device  Straight cane    Gait Pattern  Step-through pattern;Decreased arm swing - left;Decreased step length - right;Decreased stance time - left;Lateral trunk lean to right;Trunk flexed;Wide base of support    Ambulation Surface  Level;Indoor      Modalities   Modalities  Moist Heat      Moist Heat Therapy   Number Minutes Moist Heat  15 Minutes    Moist Heat Location  Cervical      Manual Therapy  Manual Therapy  Passive ROM;Manual Traction    Manual therapy comments  pain decreased to 3/10, tingling to 1/10 after gentle manual therapy    Passive ROM  into rotation and lateral flexion; passive stretch to bilat upper trapezius mm    Manual Traction  x 6 reps; before and after PROM       Neck Exercises: Stretches   Upper Trapezius Stretch  Right;Left;1 rep;30 seconds        Balance Exercises - 02/03/18 1215      Balance Exercises: Standing   Rockerboard  Lateral;Anterior/posterior;Head turns;EO;Intermittent UE support ankle/hip strategies; balance during head turns x 5 reps        PT Education - 02/03/18 1218    Education provided  Yes    Education Details  weight shifting, proper placement of cane and posture during gait    Person(s) Educated  Patient    Methods  Explanation;Demonstration    Comprehension  Need further instruction       PT Short Term Goals - 01/20/18 1210      PT SHORT TERM GOAL #1   Title  Pt will tolerate full vestibular  assessment and assessment of FGA    Baseline  Performed DGI with score of 10/24 on 12/30/17    Time  4    Period  Weeks    Status  Partially Met      PT SHORT TERM GOAL #2   Title  Pt will tolerate gentle cervical manual therapy and will demonstrate 5-8 deg increase in cervical flexion, extension, side bending and rotation     Baseline  See evaluation flow sheet for exact ROM measures    Time  4    Period  Weeks    Status  Partially Met      PT SHORT TERM GOAL #3   Title  Pt will demonstrate improvement in LUE strength from 4/5 to 4+/5 for shoulder elevation, flexion, elbow flex/ext, wrist flex/ext; will improve grip strength by 5 kg    Baseline  see flow sheet for eval measurements    Time  4    Period  Weeks    Status  Partially Met      PT SHORT TERM GOAL #4   Title  Will report 25% improvement in neck pain and episodes of L 4th/5th finger tingling    Time  4    Period  Weeks    Status  Achieved      PT SHORT TERM GOAL #5   Title  Will tolerate seated vestibular habituation and gaze adapatation exercises with <5/10 dizziness    Time  4    Period  Weeks    Status  Partially Met      PT SHORT TERM GOAL #6   Title  Will improve gait velocity to >/= 1.9 ft/sec with cane to decrease risk for falls in home environment with more narrow BOS and increased arm swing    Time  4    Period  Weeks    Status  Achieved      PT SHORT TERM GOAL #7   Title  Pt will improve DGI score to 13/24 in order to indicate decreased fall risk.      Time  4    Period  Weeks    Status  Not Met        PT Long Term Goals - 12/30/17 1256      PT LONG TERM GOAL #1   Title  Pt will demonstrate independence with neck ROM, LUE strengthening, vestibular, standing balance HEP    Time  8    Period  Weeks    Status  On-going    Target Date  02/21/18      PT LONG TERM GOAL #2   Title  Pt will report 50% improvement in dizziness with head movements, turning and with gait and will tolerate gaze  adaptation exercises in standing (x1 viewing).    Time  8    Period  Weeks    Status  On-going    Target Date  02/21/18      PT LONG TERM GOAL #3   Title  Pt will improve gait velocity to >/=2.6 ft/sec without AD for improved safety with gait in community    Time  8    Period  Weeks    Status  On-going    Target Date  02/21/18      PT LONG TERM GOAL #4   Title  Pt will improve DGI by 6 points to decrease risk for falls    Baseline  10/24 at baseline 12/30/17    Time  8    Period  Weeks    Status  Revised    Target Date  02/21/18      PT LONG TERM GOAL #5   Title  Pt will improve neck pain/tingling to <2/10 overall and will increase cervical ROM by 10-12 deg     Baseline  see flow sheet for updated ROM     Time  8    Period  Weeks    Status  On-going    Target Date  02/21/18      PT LONG TERM GOAL #6   Title  Pt will ambulate x 250' over indoor and outdoor paved surfaces without cane at MOD I level; will report return to driving short distances along well known routes.    Time  8    Period  Weeks    Status  On-going    Target Date  02/21/18            Plan - 02/03/18 1220    Clinical Impression Statement  Due to pt reporting increased pain and tingling in neck/UE provided pt with moist heat to neck and upper shoulders while providing gentle traction and ROM into rotation and lateral flexion to prepare for balance therapy.  Pt reported decreased pain and tingling after manual therapy.  Continued dynamic balance, weight shifting, postural control training on more unstable surface with head still and then with head movements.  Pt required ongoing tactile and verbal cues for head/trunk righting (pt continues to move en bloc) and for controlled weight shifting as pt tends to shift too far to L.  Performed gait training with focus on more normal BOS and more upright postural control. Pt will benefit from continued PT with addition of OT to continue to address these impairments and  progress towards LTG.    Rehab Potential  Good    PT Frequency  1x / week    PT Duration  8 weeks    PT Treatment/Interventions  ADLs/Self Care Home Management;Canalith Repostioning;Cryotherapy;Moist Heat;Traction;DME Instruction;Gait training;Stair training;Functional mobility training;Therapeutic exercise;Therapeutic activities;Balance training;Neuromuscular re-education;Patient/family education;Manual techniques;Passive range of motion;Dry needling;Taping;Vestibular    PT Next Visit Plan  weight shifting on rockerboard, progress x 1 viewing and standing balance in corner.  Manual therapy to neck/stretches.  Posture and postural awareness.  Gait with narrow BOS and more upright posture,  decreasing UE support      Consulted and Agree with Plan of Care  Patient       Patient will benefit from skilled therapeutic intervention in order to improve the following deficits and impairments:  Abnormal gait, Decreased activity tolerance, Decreased balance, Decreased endurance, Decreased mobility, Decreased range of motion, Decreased strength, Difficulty walking, Dizziness, Impaired sensation, Postural dysfunction, Pain  Visit Diagnosis: Cervicalgia  Dizziness and giddiness  Unsteadiness on feet  Difficulty in walking, not elsewhere classified  Muscle weakness (generalized)  Other disturbances of skin sensation     Problem List There are no active problems to display for this patient.   Rico Junker, PT, DPT 02/03/18    12:30 PM    [facility] [address] [place], [place], [address] Phone: [phone]   Fax:  [phone]  Name: Herbert Davis MRN: [id number] Date of Birth: 07-Sep-1981

## 2018-02-10 ENCOUNTER — Ambulatory Visit: Payer: BLUE CROSS/BLUE SHIELD | Admitting: Physical Therapy

## 2018-02-10 ENCOUNTER — Encounter: Payer: Self-pay | Admitting: Physical Therapy

## 2018-02-10 DIAGNOSIS — R262 Difficulty in walking, not elsewhere classified: Secondary | ICD-10-CM

## 2018-02-10 DIAGNOSIS — R42 Dizziness and giddiness: Secondary | ICD-10-CM | POA: Diagnosis not present

## 2018-02-10 DIAGNOSIS — M542 Cervicalgia: Secondary | ICD-10-CM | POA: Diagnosis not present

## 2018-02-10 DIAGNOSIS — M6281 Muscle weakness (generalized): Secondary | ICD-10-CM

## 2018-02-10 DIAGNOSIS — R2681 Unsteadiness on feet: Secondary | ICD-10-CM | POA: Diagnosis not present

## 2018-02-10 DIAGNOSIS — R208 Other disturbances of skin sensation: Secondary | ICD-10-CM | POA: Diagnosis not present

## 2018-02-10 NOTE — Therapy (Signed)
[facility] [address] [place], [place], [address] Phone: [phone]   Fax:  [phone]  Physical Therapy Treatment  Patient Details  Name: Herbert Davis MRN: [id number] Date of Birth: 05-06-1981 Referring Provider: Kathrynn Ducking, MD   Encounter Date: 02/10/2018  PT End of Session - 02/10/18 1154    Visit Number  8    Number of Visits  10    Date for PT Re-Evaluation  02/21/18    Authorization Type  [employer]: $20 copay; VL combined with PT, OT, Chiro, in home.  30 VL    Authorization - Visit Number  7    Authorization - Number of Visits  30    PT Start Time  3151    PT Stop Time  1151    PT Time Calculation (min)  47 min    Activity Tolerance  Other (comment);Patient limited by pain lmited by dizziness    Behavior During Therapy  Flat affect       History reviewed. No pertinent past medical history.  History reviewed. No pertinent surgical history.  There were no vitals filed for this visit.  Subjective Assessment - 02/10/18 1107    Subjective  Pt reporting increased pain behind ear, leaning head to the R, wide BOS during gait.  Dizziness is a 3-4 today, usually is a 1-2.    Pertinent History  no PMH    Limitations  Walking    Diagnostic tests  Per neurology notes: The patient has had a normal MRI of the brain, normal audiometric testing, normal ENG evaluation    Patient Stated Goals  Get rid of the dizziness; can deal with neck pain and tingling    Currently in Pain?  Yes    Pain Score  5     Pain Location  Ear    Pain Orientation  Right    Pain Descriptors / Indicators  Pressure    Pain Type  Chronic pain    Pain Onset  More than a month ago                       [facility] Adult PT Treatment/Exercise - 02/10/18 1152      Therapeutic Activites    Therapeutic Activities  Other Therapeutic Activities    Other Therapeutic Activities  discussed role of fear/anxiety and immobility after  dizziness episodes and how part of therapy is graded exposure to provocative movements, postures for habituation and decreased fear response.        Vestibular Treatment/Exercise - 02/10/18 1109      Vestibular Treatment/Exercise   Vestibular Treatment Provided  Gaze    Gaze Exercises  X1 Viewing Horizontal      X1 Viewing Horizontal   Foot Position  supported sitting    Reps  2    Comments  up to 10 repetitions at home; instructed to stay at 10 and slowly increase to 12-15 as tolerated         Balance Exercises - 02/10/18 1131      Balance Exercises: Standing   Tandem Stance  Eyes open;2 reps;Time    Stepping Strategy  Anterior;Posterior;Foam/compliant surface;5 reps;Lateral lateral stepping side to across midline, blue foam beam    Tandem Gait  Forward;Retro;Upper extremity support;4 reps      Tandem gait and tandem stance on solid surface.  Stepping strategy training standing across and long ways on blue balance beam.  PT Education - 02/10/18 1153  Education provided  Yes    Education Details  see TA, increase repetitions for seated horizontal x 1 viewing    Person(s) Educated  Patient    Methods  Explanation;Demonstration    Comprehension  Verbalized understanding       PT Short Term Goals - 01/20/18 1210      PT SHORT TERM GOAL #1   Title  Pt will tolerate full vestibular assessment and assessment of FGA    Baseline  Performed DGI with score of 10/24 on 12/30/17    Time  4    Period  Weeks    Status  Partially Met      PT SHORT TERM GOAL #2   Title  Pt will tolerate gentle cervical manual therapy and will demonstrate 5-8 deg increase in cervical flexion, extension, side bending and rotation     Baseline  See evaluation flow sheet for exact ROM measures    Time  4    Period  Weeks    Status  Partially Met      PT SHORT TERM GOAL #3   Title  Pt will demonstrate improvement in LUE strength from 4/5 to 4+/5 for shoulder elevation, flexion, elbow flex/ext, wrist  flex/ext; will improve grip strength by 5 kg    Baseline  see flow sheet for eval measurements    Time  4    Period  Weeks    Status  Partially Met      PT SHORT TERM GOAL #4   Title  Will report 25% improvement in neck pain and episodes of L 4th/5th finger tingling    Time  4    Period  Weeks    Status  Achieved      PT SHORT TERM GOAL #5   Title  Will tolerate seated vestibular habituation and gaze adapatation exercises with <5/10 dizziness    Time  4    Period  Weeks    Status  Partially Met      PT SHORT TERM GOAL #6   Title  Will improve gait velocity to >/= 1.9 ft/sec with cane to decrease risk for falls in home environment with more narrow BOS and increased arm swing    Time  4    Period  Weeks    Status  Achieved      PT SHORT TERM GOAL #7   Title  Pt will improve DGI score to 13/24 in order to indicate decreased fall risk.      Time  4    Period  Weeks    Status  Not Met        PT Long Term Goals - 12/30/17 1256      PT LONG TERM GOAL #1   Title  Pt will demonstrate independence with neck ROM, LUE strengthening, vestibular, standing balance HEP    Time  8    Period  Weeks    Status  On-going    Target Date  02/21/18      PT LONG TERM GOAL #2   Title  Pt will report 50% improvement in dizziness with head movements, turning and with gait and will tolerate gaze adaptation exercises in standing (x1 viewing).    Time  8    Period  Weeks    Status  On-going    Target Date  02/21/18      PT LONG TERM GOAL #3   Title  Pt will improve gait velocity to >/=2.6 ft/sec without AD for  improved safety with gait in community    Time  8    Period  Weeks    Status  On-going    Target Date  02/21/18      PT LONG TERM GOAL #4   Title  Pt will improve DGI by 6 points to decrease risk for falls    Baseline  10/24 at baseline 12/30/17    Time  8    Period  Weeks    Status  Revised    Target Date  02/21/18      PT LONG TERM GOAL #5   Title  Pt will improve neck  pain/tingling to <2/10 overall and will increase cervical ROM by 10-12 deg     Baseline  see flow sheet for updated ROM     Time  8    Period  Weeks    Status  On-going    Target Date  02/21/18      PT LONG TERM GOAL #6   Title  Pt will ambulate x 250' over indoor and outdoor paved surfaces without cane at MOD I level; will report return to driving short distances along well known routes.    Time  8    Period  Weeks    Status  On-going    Target Date  02/21/18            Plan - 02/10/18 1155    Clinical Impression Statement  Pt reporting increased pain and dizziness today and demonstrated limited tolerance to x1 viewing, unable to progress # of repetitions or remove support when performing in sitting but pt agreeable to self-progress at home.  Continued dynamic balance training focusing on more narrow BOS, weight shifting and stepping strategy on compliant surface.  Pt required 2 seated rest breaks but symptoms did not increase by end of session.  Pt continues to demonstrate greater difficulty controlling weight shift to L and maintaining balance during L stance.  Pt also continues to demonstrate significant apprehension and fear during balance activities; discussed fear response after dizziness and importance of graded exposure and habituation.  Will continue to address and progress as pt is able to tolerate.    Rehab Potential  Good    PT Frequency  1x / week    PT Duration  8 weeks    PT Treatment/Interventions  ADLs/Self Care Home Management;Canalith Repostioning;Cryotherapy;Moist Heat;Traction;DME Instruction;Gait training;Stair training;Functional mobility training;Therapeutic exercise;Therapeutic activities;Balance training;Neuromuscular re-education;Patient/family education;Manual techniques;Passive range of motion;Dry needling;Taping;Vestibular    PT Next Visit Plan  CHECK LTG, recertify.  weight shifting on rockerboard, progress x 1 viewing and standing balance in corner.  Manual  therapy to neck/stretches.  Posture and postural awareness.  Gait with narrow BOS and more upright posture, decreasing UE support      Consulted and Agree with Plan of Care  Patient       Patient will benefit from skilled therapeutic intervention in order to improve the following deficits and impairments:  Abnormal gait, Decreased activity tolerance, Decreased balance, Decreased endurance, Decreased mobility, Decreased range of motion, Decreased strength, Difficulty walking, Dizziness, Impaired sensation, Postural dysfunction, Pain  Visit Diagnosis: Cervicalgia  Dizziness and giddiness  Unsteadiness on feet  Difficulty in walking, not elsewhere classified  Muscle weakness (generalized)  Other disturbances of skin sensation     Problem List There are no active problems to display for this patient.  Rico Junker, PT, DPT 02/10/18    12:01 PM    [facility]  [address] [place], [place], [address] Phone: [phone]   Fax:  [phone]  Name: BRAXTON VANTREASE MRN: [id number] Date of Birth: Mar 08, 1981

## 2018-02-17 ENCOUNTER — Encounter: Payer: Self-pay | Admitting: Physical Therapy

## 2018-02-17 ENCOUNTER — Ambulatory Visit: Payer: BLUE CROSS/BLUE SHIELD | Admitting: Physical Therapy

## 2018-02-17 DIAGNOSIS — M6281 Muscle weakness (generalized): Secondary | ICD-10-CM | POA: Diagnosis not present

## 2018-02-17 DIAGNOSIS — R42 Dizziness and giddiness: Secondary | ICD-10-CM

## 2018-02-17 DIAGNOSIS — M542 Cervicalgia: Secondary | ICD-10-CM | POA: Diagnosis not present

## 2018-02-17 DIAGNOSIS — R262 Difficulty in walking, not elsewhere classified: Secondary | ICD-10-CM | POA: Diagnosis not present

## 2018-02-17 DIAGNOSIS — R2681 Unsteadiness on feet: Secondary | ICD-10-CM

## 2018-02-17 DIAGNOSIS — R208 Other disturbances of skin sensation: Secondary | ICD-10-CM

## 2018-02-17 NOTE — Therapy (Signed)
[facility] [address] [place], [place], [address] Phone: [phone]   Fax:  [phone]  Physical Therapy Treatment  Patient Details  Name: Herbert Davis MRN: [id number] Date of Birth: 12-28-1980 Referring Provider: Kathrynn Ducking, MD   Encounter Date: 02/17/2018  PT End of Session - 02/17/18 1211    Visit Number  9    Number of Visits  10    Date for PT Re-Evaluation  02/21/18    Authorization Type  BCBS: $20 copay; VL combined with PT, OT, Chiro, in home.  30 VL    Authorization - Visit Number  8    Authorization - Number of Visits  30    PT Start Time  4627    PT Stop Time  1150    PT Time Calculation (min)  46 min    Equipment Utilized During Treatment  Gait belt    Behavior During Therapy  Flat affect       History reviewed. No pertinent past medical history.  History reviewed. No pertinent surgical history.  There were no vitals filed for this visit.  Subjective Assessment - 02/17/18 1109    Subjective  Pt ambulating with more normal width BOS; still having ear pain and dizziness 2-3.    Pertinent History  no PMH    Limitations  Walking    Diagnostic tests  Per neurology notes: The patient has had a normal MRI of the brain, normal audiometric testing, normal ENG evaluation    Patient Stated Goals  Get rid of the dizziness; can deal with neck pain and tingling    Currently in Pain?  Yes    Pain Score  4     Pain Location  Ear    Pain Orientation  Right    Pain Descriptors / Indicators  Pressure    Pain Type  Chronic pain    Pain Onset  More than a month ago         [facility] PT Assessment - 02/17/18 1125      Assessment   Medical Diagnosis  Neck pain, dizziness, imbalance    Referring Provider  Kathrynn Ducking, MD    Hand Dominance  Left    Next MD Visit  August f/u with Dr. Jannifer Franklin    Prior Therapy  none      Precautions   Precautions  Fall      Prior Function   Level of Independence   Independent    Vocation  Full time employment currently not working    [employer]      Observation/Other Assessments   Focus on Therapeutic Outcomes ([place])   not indicated for diagnosis      ROM / Strength   AROM / PROM / Strength  AROM      AROM   Overall AROM   Deficits;Due to pain    Overall AROM Comments  reports tingling in LUE has resolved over the past two weeks    AROM Assessment Site  Cervical    Cervical Flexion  70 with eyes open, no pain    Cervical Extension  40, eyes closed, no pain    Cervical - Right Side Bend  68    Cervical - Left Side Bend  58    Cervical - Right Rotation  75    Cervical - Left Rotation  52      Ambulation/Gait   Ambulation/Gait  Yes    Ambulation/Gait  Assistance  4: Min assist    Ambulation/Gait Assistance Details  without cane with min A today for to maintain head in neutral position and verbal cues for more normal width BOS, upright posture, shoulder depression and arm swing.  Also cued to maintain step length and stride length when changing direction.    Ambulation Distance (Feet)  230 Feet    Assistive device  None    Gait Pattern  Step-to pattern;Step-through pattern;Decreased arm swing - right;Decreased arm swing - left;Decreased step length - right;Decreased step length - left;Decreased stride length;Decreased trunk rotation;Trunk flexed;Wide base of support    Ambulation Surface  Level;Indoor    Stairs  Yes    Stairs Assistance  5: Supervision    Stairs Assistance Details (indicate cue type and reason)  with step to sequence pt able to perform MOD I; when changing to alternating sequence pt required close supervision when descending due to uncontrolled descent and feeling as if he is going to fall forwards    Stair Management Technique  One rail Right;Alternating pattern;Step to pattern;Forwards;With cane    Number of Stairs  8    Height of Stairs  6      Standardized Balance Assessment   Standardized Balance  Assessment  10 meter walk test;Dynamic Gait Index    10 Meter Walk  15.3 or 2.14 ft/sec with cane      Dynamic Gait Index   Level Surface  Mild Impairment    Change in Gait Speed  Moderate Impairment    Gait with Horizontal Head Turns  Severe Impairment    Gait with Vertical Head Turns  Severe Impairment    Gait and Pivot Turn  Mild Impairment    Step Over Obstacle  Mild Impairment    Step Around Obstacles  Mild Impairment    Steps  Moderate Impairment    Total Score  10    DGI comment:  10/24                           PT Education - 02/17/18 1210    Education provided  Yes    Education Details  progress made with neck ROM and gait velocity; focus of visits going forwards.  Now that pt has improved neck ROM, recommended pt try slow driving in empty parking lot and keeping UE on steering wheel below shoulder level    Person(s) Educated  Patient    Methods  Explanation    Comprehension  Verbalized understanding       PT Short Term Goals - 01/20/18 1210      PT SHORT TERM GOAL #1   Title  Pt will tolerate full vestibular assessment and assessment of FGA    Baseline  Performed DGI with score of 10/24 on 12/30/17    Time  4    Period  Weeks    Status  Partially Met      PT SHORT TERM GOAL #2   Title  Pt will tolerate gentle cervical manual therapy and will demonstrate 5-8 deg increase in cervical flexion, extension, side bending and rotation     Baseline  See evaluation flow sheet for exact ROM measures    Time  4    Period  Weeks    Status  Partially Met      PT SHORT TERM GOAL #3   Title  Pt will demonstrate improvement in LUE strength from 4/5 to 4+/5 for shoulder elevation, flexion, elbow  flex/ext, wrist flex/ext; will improve grip strength by 5 kg    Baseline  see flow sheet for eval measurements    Time  4    Period  Weeks    Status  Partially Met      PT SHORT TERM GOAL #4   Title  Will report 25% improvement in neck pain and episodes of L  4th/5th finger tingling    Time  4    Period  Weeks    Status  Achieved      PT SHORT TERM GOAL #5   Title  Will tolerate seated vestibular habituation and gaze adapatation exercises with <5/10 dizziness    Time  4    Period  Weeks    Status  Partially Met      PT SHORT TERM GOAL #6   Title  Will improve gait velocity to >/= 1.9 ft/sec with cane to decrease risk for falls in home environment with more narrow BOS and increased arm swing    Time  4    Period  Weeks    Status  Achieved      PT SHORT TERM GOAL #7   Title  Pt will improve DGI score to 13/24 in order to indicate decreased fall risk.      Time  4    Period  Weeks    Status  Not Met        PT Long Term Goals - 02/17/18 1112      PT LONG TERM GOAL #1   Title  Pt will demonstrate independence with neck ROM, LUE strengthening, vestibular, standing balance HEP    Baseline  Updated HEP: driving slow speeds in parking lot    Time  8    Period  Weeks    Status  On-going      PT LONG TERM GOAL #2   Title  Pt will report 50% improvement in dizziness with head movements, turning and with gait and will tolerate gaze adaptation exercises in standing (x1 viewing).    Baseline  pt reports feeling 30-35% improvement in dizziness with head movements    Time  8    Period  Weeks    Status  Partially Met      PT LONG TERM GOAL #3   Title  Pt will improve gait velocity to >/=2.6 ft/sec without AD for improved safety with gait in community    Baseline  2.14 ft/sec with cane    Time  8    Period  Weeks    Status  Partially Met      PT LONG TERM GOAL #4   Title  Pt will improve DGI by 6 points to decrease risk for falls    Baseline  10/24 at baseline 12/30/17; remained 10/24 on 5/30    Time  8    Period  Weeks    Status  Not Met      PT LONG TERM GOAL #5   Title  Pt will improve neck pain/tingling to <2/10 overall and will increase cervical ROM by 10-12 deg     Baseline  --    Time  8    Period  Weeks    Status  Achieved       PT LONG TERM GOAL #6   Title  Pt will ambulate x 250' over indoor and outdoor paved surfaces without cane at MOD I level; will report return to driving short distances along well known routes.  Baseline  not driving, ambulated 889' without cane but with min A to maintain head position    Time  8    Period  Weeks    Status  Not Met            Plan - 02/17/18 1213    Clinical Impression Statement  Pt is making slow but steady progress towards LTG and has met 1/6 LTG with significant improvements in cervical AROM, decrease in pain and tingling down LUE.  Pt only reports 30% improvement in dizziness with head movement.  Pt has partially met gait velocity goal with improvement in gait velocity with cane but not to desired goal.  Pt did not meet DGI goal or ambulation without cane goal due ongoing reports of dizziness and instability with higher level gait challenges.  Discussed plan for next certification period including updating HEP to include more dynamic balance and gait challenges and increased focus on adaptation and habituation of vestibular system; also advised pt to start driving in a very controlled environment.  Pt would benefit from ongoing skilled PT services to address impairments, to maximize functional mobility independence and decrease falls risk.    Rehab Potential  Good    PT Frequency  1x / week    PT Duration  8 weeks    PT Treatment/Interventions  ADLs/Self Care Home Management;Canalith Repostioning;Cryotherapy;Moist Heat;Traction;DME Instruction;Gait training;Stair training;Functional mobility training;Therapeutic exercise;Therapeutic activities;Balance training;Neuromuscular re-education;Patient/family education;Manual techniques;Passive range of motion;Dry needling;Taping;Vestibular    PT Next Visit Plan  update HEP - more dynamic standing exercises, more aggressive habituation and adaptation for vestibular system.  Did he drive in parking lot?  Gait without cane,  changing direction.  descending stairs with reciprocal pattern    Consulted and Agree with Plan of Care  Patient       Patient will benefit from skilled therapeutic intervention in order to improve the following deficits and impairments:  Abnormal gait, Decreased activity tolerance, Decreased balance, Decreased endurance, Decreased mobility, Decreased range of motion, Decreased strength, Difficulty walking, Dizziness, Impaired sensation, Postural dysfunction, Pain  Visit Diagnosis: Cervicalgia  Dizziness and giddiness  Unsteadiness on feet  Difficulty in walking, not elsewhere classified  Muscle weakness (generalized)  Other disturbances of skin sensation     Problem List There are no active problems to display for this patient.  Rico Junker, PT, DPT 02/17/18    12:22 PM    [facility] [address] [place], [place], [address] Phone: [phone]   Fax:  [phone]  Name: MARIUS BETTS MRN: [id number] Date of Birth: 1981-01-12

## 2018-02-24 ENCOUNTER — Ambulatory Visit: Payer: BLUE CROSS/BLUE SHIELD | Admitting: Rehabilitation

## 2018-02-24 ENCOUNTER — Ambulatory Visit: Payer: BLUE CROSS/BLUE SHIELD | Attending: Neurology | Admitting: Occupational Therapy

## 2018-02-24 ENCOUNTER — Encounter: Payer: Self-pay | Admitting: Rehabilitation

## 2018-02-24 DIAGNOSIS — R208 Other disturbances of skin sensation: Secondary | ICD-10-CM

## 2018-02-24 DIAGNOSIS — M79602 Pain in left arm: Secondary | ICD-10-CM | POA: Diagnosis not present

## 2018-02-24 DIAGNOSIS — R42 Dizziness and giddiness: Secondary | ICD-10-CM | POA: Diagnosis not present

## 2018-02-24 DIAGNOSIS — R2681 Unsteadiness on feet: Secondary | ICD-10-CM | POA: Diagnosis not present

## 2018-02-24 DIAGNOSIS — M542 Cervicalgia: Secondary | ICD-10-CM | POA: Insufficient documentation

## 2018-02-24 DIAGNOSIS — M6281 Muscle weakness (generalized): Secondary | ICD-10-CM

## 2018-02-24 DIAGNOSIS — R278 Other lack of coordination: Secondary | ICD-10-CM | POA: Insufficient documentation

## 2018-02-24 DIAGNOSIS — R262 Difficulty in walking, not elsewhere classified: Secondary | ICD-10-CM | POA: Diagnosis not present

## 2018-02-24 NOTE — Patient Instructions (Addendum)
Gaze Stabilization: Sitting    Keeping eyes on target on wall 3 feet away, and move midline to downward position x 3-6 repeitions.  You are going to do horizontal motions in standing, see below.  Do __2-3__ sessions per day.    Gaze Stabilization: Tip Card  1.Target must remain in focus, not blurry, and appear stationary while head is in motion. 2.Perform exercises with small head movements (45 to either side of midline). 3.Increase speed of head motion so long as target is in focus. 4.If you wear eyeglasses, be sure you can see target through lens (therapist will give specific instructions for bifocal / progressive lenses). 5.These exercises may provoke dizziness or nausea. Work through these symptoms. If too dizzy, slow head movement slightly. Rest between each exercise. 6.Exercises demand concentration; avoid distractions.  Gaze Stabilization: Standing Feet Apart    Feet shoulder width apart, keeping eyes on target on wall _3-4___ feet away, tilt head down 15-30 and move head side to side for _10-15___ seconds. Do __2-3__ sessions per day.   Copyright  VHI. All rights reserved.     Feet Together, Head Motion - Eyes Open    Standing with your back to a corner and chair in front for support.  With eyes open, feet together, move head slowly: side to side.   Repeat 10 times per session. Do 2 sessions per day.   Balance: Eyes Closed - Bilateral (Varied Surfaces)    Stand, feet shoulder width, close eyes, hands on the back of the chair for support. Maintain balance 10-15 seconds. Repeat 3 times per set.  Try to keep your weight in the middle of your feet.  Try to work up to holding for 30 seconds.   Bending / Picking Up Objects    Sitting, slowly bend head down and reach towards foot on the floor. Return to upright position. Hold position until symptoms subside. Repeat __3-5__ times per session. Do __2-3__ sessions per day.  Copyright  VHI. All rights reserved.    Feet Apart (Compliant Surface) Arm Motion - Eyes Open    With eyes open, standing on compliant surface: ___pillow/cushion_____, feet shoulder width apart, arms by your side. Repeat __3__ times per session for 15 (work your way up to 30 secs) secs each. Do __2__ sessions per day.  Copyright  VHI. All rights reserved.   Sit to Stand: Easy    Get ON TARGET  Both hands on cane in front for support, erect posture, chin tucked, feet on floor with feet in line with hips. Leaning at hips, shift weight forward. Push on cane to stand without extending the head back.  Repeat _10__ times. Do __2_ sessions per day.  AROM: Lateral Neck Flexion    Slowly tilt head toward one shoulder, then the other. Hold each position __1-2__ seconds. Repeat __3-5__ times per side.  Do __2__ sessions per day.

## 2018-02-24 NOTE — Therapy (Signed)
[facility] [address] [place], [place], [address] Phone: [phone]   Fax:  [phone]  Physical Therapy Treatment  Patient Details  Name: Herbert Davis MRN: [id number] Date of Birth: 10/10/1980 Referring Provider: Dr. Anne Hahn   Encounter Date: 02/24/2018  PT End of Session - 02/24/18 1257    Visit Number  10    Number of Visits  21 per recertification    Date for PT Re-Evaluation  05/21/18    Authorization Type  BCBS: $20 copay; VL combined with PT, OT, Chiro, in home.  30 VL    Authorization - Visit Number  10 updated due to entry error    Authorization - Number of Visits  30    PT Start Time  1018    PT Stop Time  1101    PT Time Calculation (min)  43 min       History reviewed. No pertinent past medical history.  History reviewed. No pertinent surgical history.  There were no vitals filed for this visit.  Subjective Assessment - 02/24/18 1021    Subjective  Pt having more neck pain and pain into L arm, feels that it is due to sleeping position.     Pertinent History  no PMH    Limitations  Walking    Diagnostic tests  Per neurology notes: The patient has had a normal MRI of the brain, normal audiometric testing, normal ENG evaluation    Patient Stated Goals  Get rid of the dizziness; can deal with neck pain and tingling    Currently in Pain?  Yes    Pain Score  4     Pain Location  Neck    Pain Orientation  Left    Pain Descriptors / Indicators  Burning;Tingling    Pain Type  Chronic pain    Pain Radiating Towards  into L arm    Pain Onset  More than a month ago    Pain Frequency  Intermittent    Aggravating Factors   malpositioning    Pain Relieving Factors  heat                 Gaze Stabilization: Sitting    Keeping eyes on target on wall 3 feet away, and move midline to downward position x 3-6 repeitions.  You are going to do horizontal motions in standing, see below.  Do __2-3__  sessions per day.    Gaze Stabilization: Tip Card  1.Target must remain in focus, not blurry, and appear stationary while head is in motion. 2.Perform exercises with small head movements (45 to either side of midline). 3.Increase speed of head motion so long as target is in focus. 4.If you wear eyeglasses, be sure you can see target through lens (therapist will give specific instructions for bifocal / progressive lenses). 5.These exercises may provoke dizziness or nausea. Work through these symptoms. If too dizzy, slow head movement slightly. Rest between each exercise. 6.Exercises demand concentration; avoid distractions.  Gaze Stabilization: Standing Feet Apart    Feet shoulder width apart, keeping eyes on target on wall _3-4___ feet away, tilt head down 15-30 and move head side to side for _10-15___ seconds. Do __2-3__ sessions per day.   Copyright  VHI. All rights reserved.     Feet Together, Head Motion - Eyes Open    Standing with your back to a corner and chair in front for support.  With eyes open, feet together, move head slowly: side  to side.   Repeat 10 times per session. Do 2 sessions per day.   Balance: Eyes Closed - Bilateral (Varied Surfaces)    Stand, feet shoulder width, close eyes, hands on the back of the chair for support. Maintain balance 10-15 seconds. Repeat 3 times per set.  Try to keep your weight in the middle of your feet.  Try to work up to holding for 30 seconds.   Bending / Picking Up Objects    Sitting, slowly bend head down and reach towards foot on the floor. Return to upright position. Hold position until symptoms subside. Repeat __3-5__ times per session. Do __2-3__ sessions per day.  Copyright  VHI. All rights reserved.   Feet Apart (Compliant Surface) Arm Motion - Eyes Open    With eyes open, standing on compliant surface: ___pillow/cushion_____, feet shoulder width apart, arms by your side. Repeat __3__ times per  session for 15 (work your way up to 30 secs) secs each. Do __2__ sessions per day.  Copyright  VHI. All rights reserved.   Sit to Stand: Easy    Get ON TARGET  Both hands on cane in front for support, erect posture, chin tucked, feet on floor with feet in line with hips. Leaning at hips, shift weight forward. Push on cane to stand without extending the head back.  Repeat _10__ times. Do __2_ sessions per day.  AROM: Lateral Neck Flexion    Slowly tilt head toward one shoulder, then the other. Hold each position __1-2__ seconds. Repeat __3-5__ times per side.  Do __2__ sessions per day.     FYI:  Did not perform lateral flexion or sit<>stands during session as he reports good compliance with these exercises.            PT Education - 02/24/18 1023    Education provided  Yes    Education Details  updated HEP    Person(s) Educated  Patient    Methods  Explanation;Demonstration;Handout    Comprehension  Verbalized understanding;Returned demonstration       PT Short Term Goals - 02/17/18 1224      PT SHORT TERM GOAL #1   Title  Pt will demonstrate independence with updated balance, vestibular, cervical and driving HEP    Time  6    Period  Weeks    Status  New    Target Date  04/06/18      PT SHORT TERM GOAL #2   Title  Pt will improve gait velocity with cane to >/= 2.6 ft/sec    Baseline  2.14 ft/sec with cane    Time  6    Period  Weeks    Status  New    Target Date  04/06/18      PT SHORT TERM GOAL #3   Title  Pt will improve DGI by 4 points to decrease risk for falls ambulating in the community    Baseline  10/24    Time  6    Period  Weeks    Status  New    Target Date  04/06/18      PT SHORT TERM GOAL #4   Title  Will report 50% improvement in dizziness with ambulation and head turns    Baseline  30-35%    Time  6    Period  Weeks    Status  New    Target Date  04/06/18      PT SHORT TERM GOAL #5   Title  Pt will ambulate 230' indoors  without cane with supervision and negotiate 8 stairs with one rail, alternating sequence with supervision and improved control when descending    Time  6    Period  Weeks    Status  New    Target Date  04/06/18        PT Long Term Goals - 02/17/18 1234      PT LONG TERM GOAL #1   Title  Pt will demonstrate independence with neck ROM, vestibular, standing balance HEP    Time  12    Period  Weeks    Status  Revised    Target Date  05/21/18      PT LONG TERM GOAL #2   Title  Pt will report 75% improvement in dizziness with head movements, turning and with gait and will tolerate gaze adaptation exercises in standing (x1 viewing).    Time  12    Period  Weeks    Status  Revised    Target Date  05/21/18      PT LONG TERM GOAL #3   Title  Pt will improve gait velocity to >/=2.8 ft/sec without AD for improved safety with gait in community    Time  12    Period  Weeks    Status  Revised    Target Date  05/21/18      PT LONG TERM GOAL #4   Title  Pt will improve DGI to >/= 18/24    Time  12    Period  Weeks    Status  Revised    Target Date  05/21/18      PT LONG TERM GOAL #6   Title  Pt will ambulate x 250' over outdoor paved surfaces without cane at MOD I level; will report return to driving short distances along well known routes.    Time  12    Period  Weeks    Status  Revised    Target Date  05/21/18            Plan - 02/24/18 1259    Clinical Impression Statement  Skilled session focused on re-assessing HEP and progressing as able.  Note that pt continues to have marked difficulty performing vertical head movements in sitting and standing, but did tolerate habituation exercise sitting bending down to foot and back to midline very well today, therefore added to HEP. See pt instructions for details on exericses performed.     Rehab Potential  Good    PT Frequency  1x / week    PT Duration  12 weeks    PT Treatment/Interventions  ADLs/Self Care Home  Management;Canalith Repostioning;Cryotherapy;Moist Heat;Traction;DME Instruction;Gait training;Stair training;Functional mobility training;Therapeutic exercise;Therapeutic activities;Balance training;Neuromuscular re-education;Patient/family education;Manual techniques;Passive range of motion;Dry needling;Taping;Vestibular    PT Next Visit Plan  more aggressive habituation and adaptation for vestibular system.  Did he drive in parking lot?  Gait without cane, changing direction.  descending stairs with reciprocal pattern    Consulted and Agree with Plan of Care  Patient       Patient will benefit from skilled therapeutic intervention in order to improve the following deficits and impairments:  Abnormal gait, Decreased activity tolerance, Decreased balance, Decreased endurance, Decreased mobility, Decreased range of motion, Decreased strength, Difficulty walking, Dizziness, Impaired sensation, Postural dysfunction, Pain  Visit Diagnosis: Muscle weakness (generalized)  Dizziness and giddiness  Unsteadiness on feet     Problem List There are no active problems to display for this  patient.   Harriet ButteEmily Surina Storts, PT, MPT [facility] [address] [place], [place], [address] Phone: [phone]   Fax:  [phone] 02/24/18, 1:02 PM  Name: Alanda SlimBryan M Hallett MRN: [id number] Date of Birth: 1981/08/08

## 2018-02-24 NOTE — Therapy (Signed)
[facility] [address] [place], [place], [address] Phone: [phone]   Fax:  [phone]  Occupational Therapy Evaluation  Patient Details  Name: Herbert Davis MRN: [id number] Date of Birth: 01/02/1981 Referring Provider: Dr. Anne HahnWillis   Encounter Date: 02/24/2018  OT End of Session - 02/24/18 1022    Visit Number  1    Number of Visits  13    Date for OT Re-Evaluation  05/25/18    Authorization Type  BCBS    Authorization Time Period  12 weeks, no more than 4 modailities per insurance    Authorization - Visit Number  1    Authorization - Number of Visits  15 VL combined with PT    OT Start Time  0936    OT Stop Time  1008    OT Time Calculation (min)  32 min    Activity Tolerance  Patient tolerated treatment well    Behavior During Therapy  Flat affect       No past medical history on file.  No past surgical history on file.  There were no vitals filed for this visit.  Subjective Assessment - 02/24/18 0939    Subjective   Pt reports neck pain that radiates down left arm    Pertinent History  neck pain, dizziness    Patient Stated Goals  get movement back in left arm    Currently in Pain?  Yes    Pain Score  4     Pain Location  Neck radiates down left arm    Pain Orientation  Left    Pain Descriptors / Indicators  Burning    Pain Type  Chronic pain    Pain Onset  More than a month ago    Pain Frequency  Intermittent    Aggravating Factors   malpositioning    Pain Relieving Factors  heat        [facility] OT Assessment - 02/24/18 0942      Assessment   Medical Diagnosis  Neck pain/ arm pain, dizziness, imbalance    Referring Provider  Dr. Anne HahnWillis    Onset Date/Surgical Date  01/20/18    Hand Dominance  Left    Next MD Visit  August f/u with Dr. Anne HahnWillis    Prior Therapy  none      Precautions   Precautions  Fall      Balance Screen   Has the patient fallen in the past 6 months  No    Has the  patient had a decrease in activity level because of a fear of falling?   No    Is the patient reluctant to leave their home because of a fear of falling?   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      IADL   Shopping  -- has not attempted     Light Housekeeping  Performs light daily tasks such as dishwashing, bed making uses RUE    Meal Prep  Able to complete simple cold meal and snack prep Family is doing most of cooking     Financial Management  -- uses online Risk analyst Status  Independent uses cane      Written Expression   Dominant Hand  Left    Handwriting  100% legible      Observation/Other Assessments   Focus on Therapeutic Outcomes (FOTO)   not indicated for diagnosis    Outcome Measures  3 button/ unbutton: 39.19 secs      Sensation   Light Touch  Impaired Detail ring and small finger left hand    Hot/Cold  Impaired Detail appears to be impaired in ring and small finger      Coordination   Gross Motor Movements are Fluid and Coordinated  No    Fine Motor Movements are Fluid and Coordinated  No    9 Hole Peg Test   Right;Left    Right 9 Hole Peg Test  24.97 secs    Left 9 Hole Peg Test  28.22 secs    Coordination  impaired for LUE      ROM / Strength   AROM / PROM / Strength  AROM      AROM   Overall AROM   Deficits;Due to pain    Overall AROM Comments  LUE shoulder flexion 110, abduction 130, composite finger flexion grossly 90%, difficulty opposing 5th digit, difficulty adducting 5th digit      Hand Function   Right Hand Grip (lbs)  120    Left Hand Grip (lbs)  115                      OT Education - 02/24/18 1027    Education Details  role of OT, recommmendation that pt does not left arm swing in and uncontrolled manner during amb., using padding under left elbow during sitting as pt symptoms are consistent with ulnar n. compression.    Person(s) Educated  Patient    Methods  Explanation;Verbal cues    Comprehension  Verbalized understanding       OT Short Term Goals - 02/24/18 1042      OT SHORT TERM GOAL #1   Title  I with HEP. due 04/10/18    Time  6    Period  Weeks    Status  New    Target Date  04/10/18      OT SHORT TERM GOAL #2   Title  Pt will demostrate ability to retrieve a lightweight object at 120 shoulder flexion with LUE.    Baseline  110    Time  6    Period  Weeks    Status  New      OT SHORT TERM GOAL #3   Title  Pt will verbalize understanding of LUE positioning to minimize pain and  risk for injury.    Time  6    Period  Weeks    Status  New      OT SHORT TERM GOAL #4   Title  Pt will verbalize understanding of precautions related to sensory impairment    Time  6    Period  Weeks    Status  New      OT SHORT TERM GOAL #5   Title  Pt will demonstrate improved functional use of LUE as evidenced by decreasing 3 button/ unbutton to 35 secs or less    Baseline  39.19 secs    Time  6    Period  Weeks    Status  New        OT Long Term Goals - 02/24/18 1047      OT LONG TERM GOAL #1   Title  I with updated HEP.    Time  12     Period  Weeks    Status  New      OT LONG TERM GOAL #2   Title  Pt will resume use of LUE as dominant hand at least 70% of the time for ADLS/IADLs.    Time  12    Period  Weeks    Status  New      OT LONG TERM GOAL #3   Title  Pt will demonstrate improved fine motor coordination as evidenced by decreasing LUE 9 hole peg test score to 25 secs or less.     Time  12    Period  Weeks    Status  New      OT LONG TERM GOAL #4   Title  Pt will demonstrate ability to perform full composite finger flexion for increased LUE functional use.    Time  12    Period  Weeks    Status  New            Plan - 02/24/18 1038    Clinical Impression Statement  Pt is a 37 y.o male referred to OT for left arm dysfunction. Pt has been receiving PT for neck pain, dizziness and balanc issues that started in October. Pt presents with the following deficits which impede perfromance of ADLS/ IADLS: decreased A/ROM, decreased strength, decreased coordination, pain, decreased balance. Pt can benefit from skilled occuaptional therapy to maximize pt's safety and indpendence with daily activities    Occupational Profile and client history currently impacting functional performance  Pt has no significant PMH. 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 Pt ambulates with left arm swing in an uncontrolled fashion even though he has the ability to flex shoulder to 110*.    Occupational performance deficits (Please refer to evaluation for details):  ADL's;IADL's;Work;Play;Rest and Sleep;Social Participation    Rehab Potential  Fair    Current Impairments/barriers affecting progress:  pain and weakness of unknown etiology(insurance will not pay for MRI of neck), amplified symptoms    OT Frequency  1x / week plus eval, may d/c sooner dependening on progress    OT Duration  12 weeks    OT Treatment/Interventions  Self-care/ADL training;Electrical  Stimulation;Therapeutic exercise;Gait Training;Visual/perceptual remediation/compensation;Coping strategies training;Patient/family education;Splinting;Neuromuscular education;Paraffin;Moist Heat;Fluidtherapy;Energy conservation;Building services engineer;Therapeutic activities;Cognitive remediation/compensation;Passive range of motion;Manual Therapy;DME and/or AE instruction;Ultrasound;Contrast Bath;Cryotherapy;Aquatic Therapy    Plan  consider ulnar n. gliding    Clinical Decision Making  Limited treatment options, no task modification necessary  Consulted and Agree with Plan of Care  Patient       Patient will benefit from skilled therapeutic intervention in order to improve the following deficits and impairments:  Decreased knowledge of use of DME, Pain, Impaired flexibility, Abnormal gait, Decreased coordination, Decreased mobility, Impaired sensation, Decreased coping skills, Decreased strength, Decreased range of motion, Decreased endurance, Decreased activity tolerance, Decreased balance, Decreased knowledge of precautions, Decreased safety awareness, Difficulty walking, Impaired perceived functional ability, Impaired UE functional use  Visit Diagnosis: Muscle weakness (generalized)  Other disturbances of skin sensation  Other lack of coordination  Pain in left arm    Problem List There are no active problems to display for this patient.   Chrys Landgrebe 02/24/2018, 12:42 PM Keene Breath, OTR/L [phone] Phone: [phone] 12:43 PM 02/24/18  [facility] [address] [place], [place], [address] Phone: [phone]   Fax:  [phone]  Name: Herbert Davis MRN: [id number] Date of Birth: [birth year]

## 2018-03-03 ENCOUNTER — Ambulatory Visit: Payer: BLUE CROSS/BLUE SHIELD | Admitting: Physical Therapy

## 2018-03-03 ENCOUNTER — Encounter: Payer: Self-pay | Admitting: Occupational Therapy

## 2018-03-03 ENCOUNTER — Encounter: Payer: Self-pay | Admitting: Physical Therapy

## 2018-03-03 ENCOUNTER — Ambulatory Visit: Payer: BLUE CROSS/BLUE SHIELD | Admitting: Occupational Therapy

## 2018-03-03 DIAGNOSIS — M6281 Muscle weakness (generalized): Secondary | ICD-10-CM | POA: Diagnosis not present

## 2018-03-03 DIAGNOSIS — M542 Cervicalgia: Secondary | ICD-10-CM | POA: Diagnosis not present

## 2018-03-03 DIAGNOSIS — M79602 Pain in left arm: Secondary | ICD-10-CM

## 2018-03-03 DIAGNOSIS — R2681 Unsteadiness on feet: Secondary | ICD-10-CM | POA: Diagnosis not present

## 2018-03-03 DIAGNOSIS — R262 Difficulty in walking, not elsewhere classified: Secondary | ICD-10-CM

## 2018-03-03 DIAGNOSIS — R208 Other disturbances of skin sensation: Secondary | ICD-10-CM

## 2018-03-03 DIAGNOSIS — R278 Other lack of coordination: Secondary | ICD-10-CM | POA: Diagnosis not present

## 2018-03-03 DIAGNOSIS — R42 Dizziness and giddiness: Secondary | ICD-10-CM | POA: Diagnosis not present

## 2018-03-03 NOTE — Therapy (Signed)
[facility] [address] [place], [place], [address] Phone: [phone]   Fax:  [phone]  Physical Therapy Treatment  Patient Details  Name: Herbert Davis MRN: [id number] Date of Birth: August 08, 1981 Referring Provider: Dr. Anne Hahn   Encounter Date: 03/03/2018  PT End of Session - 03/03/18 1201    Visit Number  11    Number of Visits  21 per recertification    Date for PT Re-Evaluation  05/21/18    Authorization Type  BCBS: $20 copay; VL combined with PT, OT, Chiro, in home.  30 VL    Authorization - Visit Number  11 updated due to entry error    Authorization - Number of Visits  30    PT Start Time  1104    PT Stop Time  1150    PT Time Calculation (min)  46 min    Activity Tolerance  Other (comment)    Behavior During Therapy  Flat affect       History reviewed. No pertinent past medical history.  History reviewed. No pertinent surgical history.  There were no vitals filed for this visit.  Subjective Assessment - 03/03/18 1105    Subjective  Just finished with OT; heat on L neck/shoulder helping decrease discomfort to 3/10.  Had one fall last week due to rain; slipped - no injury but had a little more dizziness which has settled back down.  Went to big parking lot to drive for 4-40 minutes; had a hard time looking into rear view mirror and turning head to L to look over shoulder.      Pertinent History  no PMH    Limitations  Walking    Diagnostic tests  Per neurology notes: The patient has had a normal MRI of the brain, normal audiometric testing, normal ENG evaluation    Patient Stated Goals  Get rid of the dizziness; can deal with neck pain and tingling    Currently in Pain?  Yes    Pain Score  3     Pain Location  Neck    Pain Onset  More than a month ago                       [facility] Adult PT Treatment/Exercise - 03/03/18 2110      Ambulation/Gait   Ambulation/Gait  Yes    Ambulation/Gait Assistance  4: Min assist    Ambulation/Gait Assistance Details  continued gait training without cane with focus on use of compensatory saccades and gaze fixation during R and L turns; no LOB during R turns but demonstrated mild LOB with L turns-able to self correct.  Cues provided to narrow BOS and to maintain head in midline.  During final two laps also focused on decreasing use of tension in neck/shoulders for stability by initiating arm swing having pt hold two poles and therapist facilitating sequencing; verbal cues also required for increased step and stride length.  Pt continued to stabilize through neck/shoulders and perform arm swing only through elbow flexion<>extension; intermittent cues to depress shoulders and initiate arm swing at shoulders.    Ambulation Distance (Feet)  460 Feet    Assistive device  None    Gait Pattern  Step-through pattern;Decreased arm swing - left;Decreased arm swing - right;Decreased step length - left;Decreased step length - right;Decreased stride length;Decreased trunk rotation    Ambulation Surface  Level;Indoor      Vestibular Treatment/Exercise - 03/03/18 1112  Vestibular Treatment/Exercise   Vestibular Treatment Provided  Habituation    Habituation Exercises  Seated Horizontal Head Turns;Seated Vertical Head Turns;Seated Diagonal Head Turns      Seated Horizontal Head Turns   Number of Reps   10    Symptom Description   compensatory saccades      Seated Vertical Head Turns   Number of Reps   10    Symptom Description   comensatory saccades with grounding technique due to pt reporting significant increase in dizziness/unsteadiness with very minimal head movement            PT Education - 03/03/18 1200    Education provided  Yes    Education Details  added habituation - compensatory saccades; increase time for driving to [id number] minutes, begin walking into and out of treatment area without cane.      Person(s) Educated   Patient    Methods  Explanation;Demonstration;Handout    Comprehension  Verbalized understanding;Returned demonstration       PT Short Term Goals - 02/17/18 1224      PT SHORT TERM GOAL #1   Title  Pt will demonstrate independence with updated balance, vestibular, cervical and driving HEP    Time  6    Period  Weeks    Status  New    Target Date  04/06/18      PT SHORT TERM GOAL #2   Title  Pt will improve gait velocity with cane to >/= 2.6 ft/sec    Baseline  2.14 ft/sec with cane    Time  6    Period  Weeks    Status  New    Target Date  04/06/18      PT SHORT TERM GOAL #3   Title  Pt will improve DGI by 4 points to decrease risk for falls ambulating in the community    Baseline  10/24    Time  6    Period  Weeks    Status  New    Target Date  04/06/18      PT SHORT TERM GOAL #4   Title  Will report 50% improvement in dizziness with ambulation and head turns    Baseline  30-35%    Time  6    Period  Weeks    Status  New    Target Date  04/06/18      PT SHORT TERM GOAL #5   Title  Pt will ambulate 230' indoors without cane with supervision and negotiate 8 stairs with one rail, alternating sequence with supervision and improved control when descending    Time  6    Period  Weeks    Status  New    Target Date  04/06/18        PT Long Term Goals - 02/17/18 1234      PT LONG TERM GOAL #1   Title  Pt will demonstrate independence with neck ROM, vestibular, standing balance HEP    Time  12    Period  Weeks    Status  Revised    Target Date  05/21/18      PT LONG TERM GOAL #2   Title  Pt will report 75% improvement in dizziness with head movements, turning and with gait and will tolerate gaze adaptation exercises in standing (x1 viewing).    Time  12    Period  Weeks    Status  Revised    Target Date  05/21/18  PT LONG TERM GOAL #3   Title  Pt will improve gait velocity to >/=2.8 ft/sec without AD for improved safety with gait in community    Time  12     Period  Weeks    Status  Revised    Target Date  05/21/18      PT LONG TERM GOAL #4   Title  Pt will improve DGI to >/= 18/24    Time  12    Period  Weeks    Status  Revised    Target Date  05/21/18      PT LONG TERM GOAL #6   Title  Pt will ambulate x 250' over outdoor paved surfaces without cane at MOD I level; will report return to driving short distances along well known routes.    Time  12    Period  Weeks    Status  Revised    Target Date  05/21/18            Plan - 03/03/18 2116    Clinical Impression Statement  Treatment session focused on introduction of habituation and compensatory saccades in order to transition technique into gait and next driving session; pt continues to demonstrate increased report of symptoms and increased resistance to vertical head movements and continues to significantly limit ROM and speed of movement.  Continued to focus on gait without AD and incorporating compensatory saccades when changing direction and use of arm swing for trunk rotation and increased gait velocity.  Pt demonstrated improved width of BOS and head/trunk in neutral during gait today but at end of session pt returned to lateral leaning on cane with very wide BOS.  Will initiate gait without cane into and out of treatment area at next session.    Rehab Potential  Good    PT Frequency  1x / week    PT Duration  12 weeks    PT Treatment/Interventions  ADLs/Self Care Home Management;Canalith Repostioning;Cryotherapy;Moist Heat;Traction;DME Instruction;Gait training;Stair training;Functional mobility training;Therapeutic exercise;Therapeutic activities;Balance training;Neuromuscular re-education;Patient/family education;Manual techniques;Passive range of motion;Dry needling;Taping;Vestibular    PT Next Visit Plan  Gait into and out of treatment area without cane!!!  Work towards walking in/out of clinic without cane.  Did he drive again?  progress habituation and gaze adaptation.   Head turns and changing direction.  descending stairs with reciprocal pattern    Consulted and Agree with Plan of Care  Patient       Patient will benefit from skilled therapeutic intervention in order to improve the following deficits and impairments:  Abnormal gait, Decreased activity tolerance, Decreased balance, Decreased endurance, Decreased mobility, Decreased range of motion, Decreased strength, Difficulty walking, Dizziness, Impaired sensation, Postural dysfunction, Pain  Visit Diagnosis: Muscle weakness (generalized)  Dizziness and giddiness  Unsteadiness on feet  Other disturbances of skin sensation  Cervicalgia  Difficulty in walking, not elsewhere classified     Problem List There are no active problems to display for this patient.   Dierdre Highman, PT, DPT 03/03/18    9:24 PM    [facility] [address] [place], [place], [address] Phone: [phone]   Fax:  [phone]  Name: JUSTINN WELTER MRN: [id number] Date of Birth: 1981/04/03

## 2018-03-03 NOTE — Therapy (Signed)
[facility] [address] [place], [place], [address] Phone: [phone]   Fax:  [phone]  Occupational Therapy Treatment  Patient Details  Name: Herbert Davis MRN: [id number] Date of Birth: 01-10-1981 Referring Provider: Dr. Anne HahnWillis   Encounter Date: 03/03/2018  OT End of Session - 03/03/18 1145    Visit Number  2    Number of Visits  13    Date for OT Re-Evaluation  05/25/18    Authorization Type  BCBS    Authorization Time Period  12 weeks, no more than 4 modailities per insurance    Authorization - Visit Number  2    Authorization - Number of Visits  15    OT Start Time  1018    OT Stop Time  1100    OT Time Calculation (min)  42 min    Activity Tolerance  Patient tolerated treatment well    Behavior During Therapy  Flat affect       History reviewed. No pertinent past medical history.  History reviewed. No pertinent surgical history.  There were no vitals filed for this visit.  Subjective Assessment - 03/03/18 1022    Subjective   left arm pain today    Patient Stated Goals  get movement back in left arm    Currently in Pain?  Yes    Pain Score  4     Pain Location  Arm    Pain Orientation  Left    Pain Descriptors / Indicators  Tingling    Pain Type  Chronic pain    Pain Onset  More than a month ago    Pain Frequency  Intermittent    Aggravating Factors   heat    Pain Relieving Factors  malpositioning          Treatment: Hotpack applied while pt performed putty exercises and cane exercises, no adverse reactions. Pt was instructed in HEP, he verbalized understanding and returned demonstration.                 OT Education - 03/03/18 1144    Education Details  HEP cane supine, yellow putty, and ulnar n. gliding    Person(s) Educated  Patient    Methods  Explanation;Demonstration;Verbal cues;Handout    Comprehension  Verbalized understanding;Returned demonstration;Verbal cues  required       OT Short Term Goals - 03/03/18 1151      OT SHORT TERM GOAL #1   Title  I with HEP. due 04/10/18    Time  6    Period  Weeks    Status  New      OT SHORT TERM GOAL #2   Title  Pt will demostrate ability to retrieve a lightweight object at 120 shoulder flexion with LUE.    Baseline  110    Time  6    Period  Weeks    Status  New      OT SHORT TERM GOAL #3   Title  Pt will verbalize understanding of LUE positioning to minimize pain and risk for injury.    Time  6    Period  Weeks    Status  New      OT SHORT TERM GOAL #4   Title  Pt will verbalize understanding of precautions related to sensory impairment    Time  6    Period  Weeks    Status  New      OT SHORT TERM  GOAL #5   Title  Pt will demonstrate improved functional use of LUE as evidenced by decreasing 3 button/ unbutton to 35 secs or less    Baseline  39.19 secs    Time  6    Period  Weeks    Status  New        OT Long Term Goals - 03/03/18 1151      OT LONG TERM GOAL #1   Title  I with updated HEP.    Time  12    Period  Weeks    Status  New      OT LONG TERM GOAL #2   Title  Pt will resume use of LUE as dominant hand at least 70% of the time for ADLS/IADLs.    Time  12    Period  Weeks    Status  New      OT LONG TERM GOAL #3   Title  Pt will demonstrate improved fine motor coordination as evidenced by decreasing LUE 9 hole peg test score to 25 secs or less.     Time  12    Period  Weeks    Status  New      OT LONG TERM GOAL #4   Title  Pt will demonstrate ability to perform full composite finger flexion for increased LUE functional use.    Time  12    Period  Weeks    Status  New            Plan - 03/03/18 1152    Clinical Impression Statement  Pt is progressing towards goals. He demonstrates understanding of HEP.    Occupational Profile and client history currently impacting functional performance  Pt has no significant PMH. 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 Pt ambulates with left arm swing in an uncontrolled fashion even though he has the ability to flex shoulder to 110*.    Rehab Potential  Fair    Current Impairments/barriers affecting progress:  pain and weakness of unknown etiology(insurance will not pay for MRI of neck), amplified symptoms    OT Frequency  1x / week    OT Duration  12 weeks    OT Treatment/Interventions  Self-care/ADL training;Electrical Stimulation;Therapeutic exercise;Gait Training;Visual/perceptual remediation/compensation;Coping strategies training;Patient/family education;Splinting;Neuromuscular education;Paraffin;Moist Heat;Fluidtherapy;Energy conservation;Building services engineer;Therapeutic activities;Cognitive remediation/compensation;Passive range of motion;Manual Therapy;DME and/or AE instruction;Ultrasound;Contrast Bath;Cryotherapy;Aquatic Therapy    Plan  review HEP and progress PRN    Consulted and Agree with Plan of Care  Patient       Patient will benefit from skilled therapeutic intervention in order to improve the following deficits and impairments:  Decreased knowledge of use of DME, Pain, Impaired flexibility, Abnormal gait, Decreased coordination, Decreased mobility, Impaired sensation, Decreased coping skills, Decreased strength, Decreased range of motion, Decreased endurance, Decreased activity tolerance, Decreased balance, Decreased knowledge of precautions, Decreased safety awareness, Difficulty walking, Impaired perceived functional ability, Impaired UE functional use  Visit Diagnosis: Muscle weakness (generalized)  Other disturbances of skin sensation  Other lack of coordination  Pain in left arm    Problem List There are no active problems to display for this patient.   Leonna Schlee 03/03/2018, 11:56 AM Keene Breath, OTR/L [phone] Phone: [phone] 12:47 PM 03/03/18 [facility]  Rehabilitation [facility] [address] [place], [place], [address] Phone: [phone]   Fax:  [phone]  Name: Herbert Davis  MRN: [id number] Date of Birth: 1981-05-16

## 2018-03-03 NOTE — Patient Instructions (Addendum)
1. Grip Strengthening (Resistive Putty)   Squeeze putty using thumb and all fingers. Repeat _20___ times. Do __2__ sessions per day try to have your pinky and ring finger really bend ring and pinky finger   2. Roll putty into tube on table and pinch between each finger and thumb x 10 reps each. (can do ring and small finger together)     Copyright  VHI. All rights reserved.    Cane Overhead - Supine  Hold cane at thighs with both hands, extend arms straight over head. Hold 5 seconds. Repeat 10-20  times. Do 2 times per day.

## 2018-03-03 NOTE — Patient Instructions (Addendum)
Compensatory Strategies: Corrective Saccades    1. Sit in a supportive chair with back support, arm support and feet supported on floor.    Tape stationary targets (playing cards) on a blank wall placed __12__ inches apart up/down, left/right and diagonal, move eyes to target, keep head still. 2. Then move head in direction of target while eyes remain on target. 3/4. Repeat in opposite direction.  Right to left, up to down, diagonals Perform sitting. Repeat sequence __10__ times per session each direction. Do _1-2___ sessions per day.  Copyright  VHI. All rights reserved.

## 2018-03-10 ENCOUNTER — Encounter: Payer: Self-pay | Admitting: Physical Therapy

## 2018-03-10 ENCOUNTER — Ambulatory Visit: Payer: BLUE CROSS/BLUE SHIELD | Admitting: Physical Therapy

## 2018-03-10 DIAGNOSIS — R278 Other lack of coordination: Secondary | ICD-10-CM | POA: Diagnosis not present

## 2018-03-10 DIAGNOSIS — R262 Difficulty in walking, not elsewhere classified: Secondary | ICD-10-CM | POA: Diagnosis not present

## 2018-03-10 DIAGNOSIS — R2681 Unsteadiness on feet: Secondary | ICD-10-CM

## 2018-03-10 DIAGNOSIS — M542 Cervicalgia: Secondary | ICD-10-CM

## 2018-03-10 DIAGNOSIS — M6281 Muscle weakness (generalized): Secondary | ICD-10-CM

## 2018-03-10 DIAGNOSIS — R42 Dizziness and giddiness: Secondary | ICD-10-CM

## 2018-03-10 DIAGNOSIS — R208 Other disturbances of skin sensation: Secondary | ICD-10-CM

## 2018-03-10 DIAGNOSIS — M79602 Pain in left arm: Secondary | ICD-10-CM | POA: Diagnosis not present

## 2018-03-10 NOTE — Therapy (Signed)
[facility] [address] [place], [place], [address] Phone: [phone]   Fax:  [phone]  Physical Therapy Treatment  Patient Details  Name: Herbert Davis MRN: [id number] Date of Birth: [birth year] Referring Provider: Dr. Anne Hahn   Encounter Date: 03/10/2018  PT End of Session - 03/10/18 1158    Visit Number  12    Number of Visits  21 per recertification    Date for PT Re-Evaluation  05/21/18    Authorization Type  BCBS: $20 copay; VL combined with PT, OT, Chiro, in home.  30 VL    Authorization - Visit Number  12 updated due to entry error    Authorization - Number of Visits  30    PT Start Time  1106    PT Stop Time  1150    PT Time Calculation (min)  44 min    Activity Tolerance  Patient tolerated treatment well    Behavior During Therapy  [facility] for tasks assessed/performed       History reviewed. No pertinent past medical history.  History reviewed. No pertinent surgical history.  There were no vitals filed for this visit.  Subjective Assessment - 03/10/18 1109    Subjective  Was able to drive in the parking lot again; drove about 10 minutes, looking up to rear view and down to speedometer is still hard.  Compensatory saccades are going okay; side to side is not difficult, up and down and diagonals are most difficult.    Pertinent History  no PMH    Limitations  Walking    Diagnostic tests  Per neurology notes: The patient has had a normal MRI of the brain, normal audiometric testing, normal ENG evaluation    Patient Stated Goals  Get rid of the dizziness; can deal with neck pain and tingling    Currently in Pain?  Yes    Pain Score  3     Pain Location  Neck    Pain Orientation  Left    Pain Descriptors / Indicators  Tingling    Pain Type  Chronic pain    Pain Onset  More than a month ago                       [facility] Adult PT Treatment/Exercise - 03/10/18 1149      Ambulation/Gait   Ambulation/Gait  Yes    Ambulation/Gait Assistance  5: Supervision    Ambulation/Gait Assistance Details  gait without use of cane into and out of treatment area with verbal cues for shoulder depression, arm swing and more normal BOS    Ambulation Distance (Feet)  200 Feet    Assistive device  None    Gait Pattern  Step-through pattern;Decreased stance time - left;Decreased weight shift to left;Wide base of support    Ambulation Surface  Level;Indoor      Vestibular Treatment/Exercise - 03/10/18 1114      Vestibular Treatment/Exercise   Vestibular Treatment Provided  Habituation    Habituation Exercises  Seated Horizontal Head Turns;Seated Vertical Head Turns;Seated Diagonal Head Turns;Standing Horizontal Head Turns      Seated Horizontal Head Turns   Number of Reps   12    Symptom Description   compensatory saccades; verbal cues for use of core activation and grounding through UE and LE to prevent tipping      Seated Vertical Head Turns   Number of Reps   12    Symptom  Description   compensatory saccades; verbal cues for use of core activation and grounding through UE and LE to prevent tipping      Seated Diagonal Head Turns   Number of Reps  12    Symptoms Description   R and L diagonals compensatory saccades; verbal cues for use of core activation and grounding through UE and LE to prevent tipping      Standing Horizontal Head Turns   Number of Reps   3 reps of 1 minute marching    Symptom Description   marching in place with arm swing in corner, 1/4 head turns to L and R, chair in front for support         Balance Exercises - 03/10/18 1145      Balance Exercises: Standing   Sit to Stand Time  10 reps with feet together, no UE with therapist providing tactile cues to maintain head and trunk in midline        PT Education - 03/10/18 1158    Education provided  Yes    Education Details  progressed habituation and distance of targets for compensatory saccades     Person(s) Educated  Patient    Methods  Explanation    Comprehension  Verbalized understanding       PT Short Term Goals - 02/17/18 1224      PT SHORT TERM GOAL #1   Title  Pt will demonstrate independence with updated balance, vestibular, cervical and driving HEP    Time  6    Period  Weeks    Status  New    Target Date  04/06/18      PT SHORT TERM GOAL #2   Title  Pt will improve gait velocity with cane to >/= 2.6 ft/sec    Baseline  2.14 ft/sec with cane    Time  6    Period  Weeks    Status  New    Target Date  04/06/18      PT SHORT TERM GOAL #3   Title  Pt will improve DGI by 4 points to decrease risk for falls ambulating in the community    Baseline  10/24    Time  6    Period  Weeks    Status  New    Target Date  04/06/18      PT SHORT TERM GOAL #4   Title  Will report 50% improvement in dizziness with ambulation and head turns    Baseline  30-35%    Time  6    Period  Weeks    Status  New    Target Date  04/06/18      PT SHORT TERM GOAL #5   Title  Pt will ambulate 230' indoors without cane with supervision and negotiate 8 stairs with one rail, alternating sequence with supervision and improved control when descending    Time  6    Period  Weeks    Status  New    Target Date  04/06/18        PT Long Term Goals - 02/17/18 1234      PT LONG TERM GOAL #1   Title  Pt will demonstrate independence with neck ROM, vestibular, standing balance HEP    Time  12    Period  Weeks    Status  Revised    Target Date  05/21/18      PT LONG TERM GOAL #2   Title  Pt  will report 75% improvement in dizziness with head movements, turning and with gait and will tolerate gaze adaptation exercises in standing (x1 viewing).    Time  12    Period  Weeks    Status  Revised    Target Date  05/21/18      PT LONG TERM GOAL #3   Title  Pt will improve gait velocity to >/=2.8 ft/sec without AD for improved safety with gait in community    Time  12    Period  Weeks     Status  Revised    Target Date  05/21/18      PT LONG TERM GOAL #4   Title  Pt will improve DGI to >/= 18/24    Time  12    Period  Weeks    Status  Revised    Target Date  05/21/18      PT LONG TERM GOAL #6   Title  Pt will ambulate x 250' over outdoor paved surfaces without cane at MOD I level; will report return to driving short distances along well known routes.    Time  12    Period  Weeks    Status  Revised    Target Date  05/21/18            Plan - 03/10/18 1158    Clinical Impression Statement  Treatment session today continued to focus on habituation training, self monitoring of head and trunk position and use of grounding to self correct and maintain during head turns.  With increased attention to trunk position pt able to resist leaning to L and R with head turn to L and R.  Continued dynamic balance training with marching in place with head turns and sit <> stand with more narrow BOS.  Pt experienced 2 LOB with head turns to L and down but able to self correct and prevent LOB after first episode.  Initiated gait from waiting area to treatment area without cane today with no LOB but continued cues for more normal gait sequence.  Will continue to progress towards LTG.    Rehab Potential  Good    PT Frequency  1x / week    PT Duration  12 weeks    PT Treatment/Interventions  ADLs/Self Care Home Management;Canalith Repostioning;Cryotherapy;Moist Heat;Traction;DME Instruction;Gait training;Stair training;Functional mobility training;Therapeutic exercise;Therapeutic activities;Balance training;Neuromuscular re-education;Patient/family education;Manual techniques;Passive range of motion;Dry needling;Taping;Vestibular    PT Next Visit Plan  I held PT spots for him through end of Aug - have him confirm and schedule!! Gait into and out of treatment area without cane!!!  Work towards walking in/out of clinic without cane.  continue to progress habituation and gaze adaptation to  standing;  Tall kneeling for balance.  Head turns and changing direction.  descending stairs with reciprocal pattern    Consulted and Agree with Plan of Care  Patient       Patient will benefit from skilled therapeutic intervention in order to improve the following deficits and impairments:  Abnormal gait, Decreased activity tolerance, Decreased balance, Decreased endurance, Decreased mobility, Decreased range of motion, Decreased strength, Difficulty walking, Dizziness, Impaired sensation, Postural dysfunction, Pain  Visit Diagnosis: Dizziness and giddiness  Cervicalgia  Unsteadiness on feet  Difficulty in walking, not elsewhere classified  Other disturbances of skin sensation  Muscle weakness (generalized)     Problem List There are no active problems to display for this patient.   Dierdre HighmanAudra F Nyla Creason, PT, DPT 03/10/18    12:15  PM    [facility] [address] [place], [place], [address] Phone: [phone]   Fax:  [phone]  Name: Herbert Davis MRN: [id number] Date of Birth: 1981/08/19

## 2018-03-17 ENCOUNTER — Encounter: Payer: Self-pay | Admitting: Physical Therapy

## 2018-03-18 ENCOUNTER — Encounter: Payer: Self-pay | Admitting: Physical Therapy

## 2018-03-18 ENCOUNTER — Ambulatory Visit: Payer: BLUE CROSS/BLUE SHIELD | Admitting: Physical Therapy

## 2018-03-18 ENCOUNTER — Ambulatory Visit: Payer: BLUE CROSS/BLUE SHIELD | Admitting: Occupational Therapy

## 2018-03-18 DIAGNOSIS — R42 Dizziness and giddiness: Secondary | ICD-10-CM

## 2018-03-18 DIAGNOSIS — M542 Cervicalgia: Secondary | ICD-10-CM | POA: Diagnosis not present

## 2018-03-18 DIAGNOSIS — R2681 Unsteadiness on feet: Secondary | ICD-10-CM | POA: Diagnosis not present

## 2018-03-18 DIAGNOSIS — R262 Difficulty in walking, not elsewhere classified: Secondary | ICD-10-CM

## 2018-03-18 DIAGNOSIS — R278 Other lack of coordination: Secondary | ICD-10-CM | POA: Diagnosis not present

## 2018-03-18 DIAGNOSIS — M79602 Pain in left arm: Secondary | ICD-10-CM

## 2018-03-18 DIAGNOSIS — M6281 Muscle weakness (generalized): Secondary | ICD-10-CM | POA: Diagnosis not present

## 2018-03-18 DIAGNOSIS — R208 Other disturbances of skin sensation: Secondary | ICD-10-CM

## 2018-03-18 NOTE — Patient Instructions (Signed)
Stand at wall with towel under each hand, slide towel up wall with both hands as far as you are comfortable, 10-20 reps 1-2 x day within pain free range Continue putty exercises and nerve gliding exercises.

## 2018-03-18 NOTE — Therapy (Signed)
[facility] [address] [place], [place], [address] Phone: [phone]   Fax:  [phone]  Occupational Therapy Treatment  Patient Details  Name: Herbert Davis MRN: [id number] Date of Birth: Apr 22, 1981 Referring Provider: Dr. Anne Hahn   Encounter Date: 03/18/2018  OT End of Session - 03/18/18 1110    Visit Number  3    Number of Visits  13    Date for OT Re-Evaluation  05/25/18    Authorization Type  BCBS    Authorization Time Period  12 weeks, no more than 4 modailities per insurance    Authorization - Visit Number  3    Authorization - Number of Visits  15    OT Start Time  1103    OT Stop Time  1145    OT Time Calculation (min)  42 min       No past medical history on file.  No past surgical history on file.  There were no vitals filed for this visit.  Subjective Assessment - 03/18/18 1106    Subjective   left arm pain today, but it has improved    Pertinent History  neck pain, dizziness    Patient Stated Goals  get movement back in left arm    Currently in Pain?  Yes    Pain Score  2     Pain Location  Arm    Pain Orientation  Left    Pain Descriptors / Indicators  Tingling    Pain Type  Chronic pain    Pain Onset  More than a month ago    Pain Frequency  Intermittent    Aggravating Factors   heat    Pain Relieving Factors  malpositioning    Multiple Pain Sites  No           Treatment: Hotpack applied to left shoulder x 15 mins  While pt  Exercised,  pt performed ulnar n gliding exercises following demonstration using handout 5-10 reps. No adverse reactions to heat. Revieiwed red putty HEP 10-20 reps, min v.c Pulling small pegs from putty with LUE for increased grip and pinch as well as fine motor coordination. Wall slides x 10 reps min v.c, then wall pushups x 5 reps min v.c Cane exercise in seated for shoulder flexion x 10 reps within painfree ROM(grossly 100) Arm bike x 5 mins for  reciprocal movement, min v.c for speed, pt performed at grossly 20-25 rpm. Placing and removing graded clothes pins from antennae with ring and small finger( green, red and yellow clothespins only), min difficulty/ v.c.                  OT Short Term Goals - 03/03/18 1151      OT SHORT TERM GOAL #1   Title  I with HEP. due 04/10/18    Time  6    Period  Weeks    Status  New      OT SHORT TERM GOAL #2   Title  Pt will demostrate ability to retrieve a lightweight object at 120 shoulder flexion with LUE.    Baseline  110    Time  6    Period  Weeks    Status  New      OT SHORT TERM GOAL #3   Title  Pt will verbalize understanding of LUE positioning to minimize pain and risk for injury.    Time  6    Period  Weeks  Status  New      OT SHORT TERM GOAL #4   Title  Pt will verbalize understanding of precautions related to sensory impairment    Time  6    Period  Weeks    Status  New      OT SHORT TERM GOAL #5   Title  Pt will demonstrate improved functional use of LUE as evidenced by decreasing 3 button/ unbutton to 35 secs or less    Baseline  39.19 secs    Time  6    Period  Weeks    Status  New        OT Long Term Goals - 03/03/18 1151      OT LONG TERM GOAL #1   Title  I with updated HEP.    Time  12    Period  Weeks    Status  New      OT LONG TERM GOAL #2   Title  Pt will resume use of LUE as dominant hand at least 70% of the time for ADLS/IADLs.    Time  12    Period  Weeks    Status  New      OT LONG TERM GOAL #3   Title  Pt will demonstrate improved fine motor coordination as evidenced by decreasing LUE 9 hole peg test score to 25 secs or less.     Time  12    Period  Weeks    Status  New      OT LONG TERM GOAL #4   Title  Pt will demonstrate ability to perform full composite finger flexion for increased LUE functional use.    Time  12    Period  Weeks    Status  New            Plan - 03/18/18 1110    Clinical Impression  Statement  Pt is progressing towards goals. He reports that stretches have helped his pain    Occupational Profile and client history currently impacting functional performance  Pt has no significant PMH. 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 Pt ambulates with left arm swing in an uncontrolled fashion even though he has the ability to flex shoulder to 110*.    Occupational performance deficits (Please refer to evaluation for details):  ADL's;IADL's;Work;Play;Rest and Sleep;Social Participation    Rehab Potential  Fair    Current Impairments/barriers affecting progress:  pain and weakness of unknown etiology(insurance will not pay for MRI of neck), amplified symptoms    OT Frequency  1x / week    OT Duration  12 weeks    OT Treatment/Interventions  Self-care/ADL training;Electrical Stimulation;Therapeutic exercise;Gait Training;Visual/perceptual remediation/compensation;Coping strategies training;Patient/family education;Splinting;Neuromuscular education;Paraffin;Moist Heat;Fluidtherapy;Energy conservation;Building services engineerunctional Mobility Training;Therapeutic activities;Cognitive remediation/compensation;Passive range of motion;Manual Therapy;DME and/or AE instruction;Ultrasound;Contrast Bath;Cryotherapy;Aquatic Therapy       Patient will benefit from skilled therapeutic intervention in order to improve the following deficits and impairments:     Visit Diagnosis: Muscle weakness (generalized)  Other lack of coordination  Pain in left arm  Other disturbances of skin sensation    Problem List There are no active problems to display for this patient.   RINE,KATHRYN 03/18/2018, 11:14 AM  [facility] [address] [place], [place], [address] Phone: [phone]   Fax:  [phone]  Name: Herbert Davis MRN: [id number] Date of Birth: [birth year]

## 2018-03-18 NOTE — Therapy (Signed)
[facility] [address] [place], [place], [address] Phone: [phone]   Fax:  [phone]  Physical Therapy Treatment  Patient Details  Name: Herbert Davis MRN: [id number] Date of Birth: 08-16-1981 Referring Provider: Dr. Anne HahnWillis   Encounter Date: 03/18/2018  PT End of Session - 03/18/18 1253    Visit Number  13    Number of Visits  21 per recertification    Date for PT Re-Evaluation  05/21/18    Authorization Type  BCBS: $20 copay; VL combined with PT, OT, Chiro, in home.  30 VL    Authorization - Visit Number  13 updated due to entry error    Authorization - Number of Visits  30    PT Start Time  1149    PT Stop Time  1235    PT Time Calculation (min)  46 min    Activity Tolerance  Patient tolerated treatment well    Behavior During Therapy  WFL for tasks assessed/performed       History reviewed. No pertinent past medical history.  History reviewed. No pertinent surgical history.  There were no vitals filed for this visit.  Subjective Assessment - 03/18/18 1155    Subjective  Drove again but only able to once due to family in town.      Pertinent History  no PMH    Limitations  Walking    Diagnostic tests  Per neurology notes: The patient has had a normal MRI of the brain, normal audiometric testing, normal ENG evaluation    Patient Stated Goals  Get rid of the dizziness; can deal with neck pain and tingling    Pain Onset  More than a month ago                       [facility] Adult PT Treatment/Exercise - 03/18/18 1159      Ambulation/Gait   Ambulation/Gait  Yes    Ambulation/Gait Assistance  4: Min guard    Ambulation/Gait Assistance Details  gait outside without AD over pavement, performing up/down curb multiple times and walking between cars for more narrow BOS.  Cues for shoulder depression and natural arm swing    Ambulation Distance (Feet)  500 Feet    Assistive device  None    Gait  Pattern  Step-through pattern;Decreased arm swing - right;Decreased arm swing - left;Decreased step length - right;Decreased step length - left;Decreased stride length    Ambulation Surface  Unlevel;Outdoor;Paved    Curb  4: Min assist    Curb Details (indicate cue type and reason)  no AD; reports greatest difficulty stepping down curb    Gait Comments  Also performed gait training over indoor surfaces but with obstacles: figure 8 gait, stepping over 6" box, walking through narrow path and stepping up/down 4" step x 4 reps, all without UE support on cane with min A from therapist and verbal cues for safety and technique and cues for shoulder depression and arm swing      Vestibular Treatment/Exercise - 03/18/18 1217      Vestibular Treatment/Exercise   Vestibular Treatment Provided  Gaze    Gaze Exercises  X1 Viewing Horizontal;X1 Viewing Vertical      X1 Viewing Horizontal   Foot Position  supported sitting     Reps  2    Comments  progressed from 20 repetitions > 30 repetitions with improved speed and ROM      X1 Viewing Vertical  Foot Position  supported sitting    Comments  5 reps x 2 sets          Balance Exercises - 03/18/18 1212      Balance Exercises: Standing   Gait with Head Turns  Forward;4 reps to L and R x 5 steps each, min A        PT Education - 03/18/18 1252    Education provided  Yes    Education Details  increase horizontal x 1 view to 30 reps, start vertical x 5 reps.  Continue driving.  Next week begin leaving cane in car and ambulating into/out of clinic without AD    Person(s) Educated  Patient    Methods  Explanation;Demonstration    Comprehension  Verbalized understanding;Returned demonstration       PT Short Term Goals - 02/17/18 1224      PT SHORT TERM GOAL #1   Title  Pt will demonstrate independence with updated balance, vestibular, cervical and driving HEP    Time  6    Period  Weeks    Status  New    Target Date  04/06/18      PT  SHORT TERM GOAL #2   Title  Pt will improve gait velocity with cane to >/= 2.6 ft/sec    Baseline  2.14 ft/sec with cane    Time  6    Period  Weeks    Status  New    Target Date  04/06/18      PT SHORT TERM GOAL #3   Title  Pt will improve DGI by 4 points to decrease risk for falls ambulating in the community    Baseline  10/24    Time  6    Period  Weeks    Status  New    Target Date  04/06/18      PT SHORT TERM GOAL #4   Title  Will report 50% improvement in dizziness with ambulation and head turns    Baseline  30-35%    Time  6    Period  Weeks    Status  New    Target Date  04/06/18      PT SHORT TERM GOAL #5   Title  Pt will ambulate 230' indoors without cane with supervision and negotiate 8 stairs with one rail, alternating sequence with supervision and improved control when descending    Time  6    Period  Weeks    Status  New    Target Date  04/06/18        PT Long Term Goals - 02/17/18 1234      PT LONG TERM GOAL #1   Title  Pt will demonstrate independence with neck ROM, vestibular, standing balance HEP    Time  12    Period  Weeks    Status  Revised    Target Date  05/21/18      PT LONG TERM GOAL #2   Title  Pt will report 75% improvement in dizziness with head movements, turning and with gait and will tolerate gaze adaptation exercises in standing (x1 viewing).    Time  12    Period  Weeks    Status  Revised    Target Date  05/21/18      PT LONG TERM GOAL #3   Title  Pt will improve gait velocity to >/=2.8 ft/sec without AD for improved safety with gait in community    Time  12    Period  Weeks    Status  Revised    Target Date  05/21/18      PT LONG TERM GOAL #4   Title  Pt will improve DGI to >/= 18/24    Time  12    Period  Weeks    Status  Revised    Target Date  05/21/18      PT LONG TERM GOAL #6   Title  Pt will ambulate x 250' over outdoor paved surfaces without cane at MOD I level; will report return to driving short distances  along well known routes.    Time  12    Period  Weeks    Status  Revised    Target Date  05/21/18            Plan - 03/18/18 1254    Clinical Impression Statement  Treatment session today focused on progression of gait training to outdoor gait and with higher level obstacle challenges that pt reports give him increased difficulty in the community (walk between people, walking slowly, sudden stopping and starting) without use of AD and more normal BOS.  Also progressed x 1 viewing in sitting and initiated vertical x 1 viewing for HEP.  Pt tolerated well with no significant reports of dizziness; pt continues to experience sudden LOB with L head turns during gait but able to recover without therapist assistance.    Rehab Potential  Good    PT Frequency  1x / week    PT Duration  12 weeks    PT Treatment/Interventions  ADLs/Self Care Home Management;Canalith Repostioning;Cryotherapy;Moist Heat;Traction;DME Instruction;Gait training;Stair training;Functional mobility training;Therapeutic exercise;Therapeutic activities;Balance training;Neuromuscular re-education;Patient/family education;Manual techniques;Passive range of motion;Dry needling;Taping;Vestibular    PT Next Visit Plan  walk in/out of clinic without AD; continue to progress habituation and gaze adaptation to standing adding in vertical;  Head turns and changing direction.  gait with community challenges: changing gait speed fast <> slow, sudden stops, external perturbations, walking in narrow path, descending stairs with reciprocal pattern    Consulted and Agree with Plan of Care  Patient       Patient will benefit from skilled therapeutic intervention in order to improve the following deficits and impairments:  Abnormal gait, Decreased activity tolerance, Decreased balance, Decreased endurance, Decreased mobility, Decreased range of motion, Decreased strength, Difficulty walking, Dizziness, Impaired sensation, Postural dysfunction,  Pain  Visit Diagnosis: Dizziness and giddiness  Cervicalgia  Difficulty in walking, not elsewhere classified  Unsteadiness on feet  Other disturbances of skin sensation     Problem List There are no active problems to display for this patient.   Dierdre Highman, PT, DPT 03/18/18    1:07 PM    [facility] [address] [place], [place], [address] Phone: [phone]   Fax:  [phone]  Name: Herbert Davis MRN: [id number] Date of Birth: Aug 17, 1981

## 2018-03-23 ENCOUNTER — Encounter: Payer: Self-pay | Admitting: Physical Therapy

## 2018-03-23 ENCOUNTER — Ambulatory Visit: Payer: BLUE CROSS/BLUE SHIELD | Admitting: Occupational Therapy

## 2018-03-23 ENCOUNTER — Ambulatory Visit: Payer: BLUE CROSS/BLUE SHIELD | Attending: Neurology | Admitting: Physical Therapy

## 2018-03-23 DIAGNOSIS — M79602 Pain in left arm: Secondary | ICD-10-CM | POA: Diagnosis not present

## 2018-03-23 DIAGNOSIS — M542 Cervicalgia: Secondary | ICD-10-CM | POA: Insufficient documentation

## 2018-03-23 DIAGNOSIS — R278 Other lack of coordination: Secondary | ICD-10-CM | POA: Insufficient documentation

## 2018-03-23 DIAGNOSIS — M6281 Muscle weakness (generalized): Secondary | ICD-10-CM | POA: Diagnosis not present

## 2018-03-23 DIAGNOSIS — R2681 Unsteadiness on feet: Secondary | ICD-10-CM

## 2018-03-23 DIAGNOSIS — R208 Other disturbances of skin sensation: Secondary | ICD-10-CM | POA: Diagnosis not present

## 2018-03-23 DIAGNOSIS — R262 Difficulty in walking, not elsewhere classified: Secondary | ICD-10-CM

## 2018-03-23 DIAGNOSIS — R42 Dizziness and giddiness: Secondary | ICD-10-CM

## 2018-03-23 NOTE — Therapy (Signed)
[facility] [address] [place], [place], [address] Phone: [phone]   Fax:  [phone]  Physical Therapy Treatment  Patient Details  Name: Herbert Davis MRN: [id number] Date of Birth: 1981-07-04 Referring Provider: Dr. Anne Hahn   Encounter Date: 03/23/2018  PT End of Session - 03/23/18 1249    Visit Number  14    Number of Visits  21 per recertification    Date for PT Re-Evaluation  05/21/18    Authorization Type  BCBS: $20 copay; VL combined with PT, OT, Chiro, in home.  30 VL    Authorization - Visit Number  14 updated due to entry error    Authorization - Number of Visits  30    PT Start Time  1148    PT Stop Time  1232    PT Time Calculation (min)  44 min    Equipment Utilized During Treatment  Gait belt    Behavior During Therapy  Anxious       History reviewed. No pertinent past medical history.  History reviewed. No pertinent surgical history.  There were no vitals filed for this visit.  Subjective Assessment - 03/23/18 1151    Subjective  Drove once over the weekend.  Walked in without cane today.    Pertinent History  no PMH    Limitations  Walking    Diagnostic tests  Per neurology notes: The patient has had a normal MRI of the brain, normal audiometric testing, normal ENG evaluation    Patient Stated Goals  Get rid of the dizziness; can deal with neck pain and tingling    Currently in Pain?  Yes    Pain Onset  More than a month ago                       [facility] Adult PT Treatment/Exercise - 03/23/18 1212      Ambulation/Gait   Ambulation/Gait  Yes    Ambulation/Gait Assistance  4: Min guard    Ambulation/Gait Assistance Details  indoor but with variety of challenges including changing gait speed, sudden stops, head turns in various directions, retro gait, side stepping, turns to L and R, zig zag to L and R    Ambulation Distance (Feet)  1000 Feet    Assistive device  None    Gait Pattern  Step-through pattern;Decreased arm swing - right;Decreased arm swing - left;Decreased step length - right;Decreased step length - left;Decreased stride length    Ambulation Surface  Level;Indoor      Vestibular Treatment/Exercise - 03/23/18 1153      Vestibular Treatment/Exercise   Vestibular Treatment Provided  Gaze    Gaze Exercises  X1 Viewing Horizontal;X1 Viewing Vertical      X1 Viewing Horizontal   Foot Position  standing with one UE support    Reps  10    Comments  2 sets at very slow speed; one hand on back of chair, therapist preventing shoulders from rotating      X1 Viewing Vertical   Foot Position  unsupported sitting    Reps  10    Comments  2 sets; significantly slowed speed - attempted to cue pt to increase speed of movement but unable to self-regulate - may benefit from metronome            PT Education - 03/23/18 1248    Education provided  Yes    Education Details  progress x 1 viewing to  standing - 10 reps >> 30 reps.  x 1 viewing vertical in sitting but increase speed.      Person(s) Educated  Patient    Methods  Explanation;Demonstration    Comprehension  Verbalized understanding;Returned demonstration       PT Short Term Goals - 02/17/18 1224      PT SHORT TERM GOAL #1   Title  Pt will demonstrate independence with updated balance, vestibular, cervical and driving HEP    Time  6    Period  Weeks    Status  New    Target Date  04/06/18      PT SHORT TERM GOAL #2   Title  Pt will improve gait velocity with cane to >/= 2.6 ft/sec    Baseline  2.14 ft/sec with cane    Time  6    Period  Weeks    Status  New    Target Date  04/06/18      PT SHORT TERM GOAL #3   Title  Pt will improve DGI by 4 points to decrease risk for falls ambulating in the community    Baseline  10/24    Time  6    Period  Weeks    Status  New    Target Date  04/06/18      PT SHORT TERM GOAL #4   Title  Will report 50% improvement in dizziness with  ambulation and head turns    Baseline  30-35%    Time  6    Period  Weeks    Status  New    Target Date  04/06/18      PT SHORT TERM GOAL #5   Title  Pt will ambulate 230' indoors without cane with supervision and negotiate 8 stairs with one rail, alternating sequence with supervision and improved control when descending    Time  6    Period  Weeks    Status  New    Target Date  04/06/18        PT Long Term Goals - 02/17/18 1234      PT LONG TERM GOAL #1   Title  Pt will demonstrate independence with neck ROM, vestibular, standing balance HEP    Time  12    Period  Weeks    Status  Revised    Target Date  05/21/18      PT LONG TERM GOAL #2   Title  Pt will report 75% improvement in dizziness with head movements, turning and with gait and will tolerate gaze adaptation exercises in standing (x1 viewing).    Time  12    Period  Weeks    Status  Revised    Target Date  05/21/18      PT LONG TERM GOAL #3   Title  Pt will improve gait velocity to >/=2.8 ft/sec without AD for improved safety with gait in community    Time  12    Period  Weeks    Status  Revised    Target Date  05/21/18      PT LONG TERM GOAL #4   Title  Pt will improve DGI to >/= 18/24    Time  12    Period  Weeks    Status  Revised    Target Date  05/21/18      PT LONG TERM GOAL #6   Title  Pt will ambulate x 250' over outdoor paved surfaces without cane at  MOD I level; will report return to driving short distances along well known routes.    Time  12    Period  Weeks    Status  Revised    Target Date  05/21/18            Plan - 03/23/18 1250    Clinical Impression Statement  Pt progressed to ambulating in and out of clinic without cane today.  Focused session on progression of x 1 viewing to standing but unable to progress vertical head nods due to pt continuing to perform at extremely slow speed and reporting significant symptoms.  Continued to focus on higher level gait training with various  challenges - pt continues to report greatest amount of symptoms with looking up/down and diagonal head turns - during these head movements pt has exaggerated LOB, begins to walk on ankles and toes but able to self-correct balance.  Still requires cues to depress shoulders and for natural arm swing but demonstrates improved BOS without cues.  Will continue to address and progress towards LTG.    Rehab Potential  Good    PT Frequency  1x / week    PT Duration  12 weeks    PT Treatment/Interventions  ADLs/Self Care Home Management;Canalith Repostioning;Cryotherapy;Moist Heat;Traction;DME Instruction;Gait training;Stair training;Functional mobility training;Therapeutic exercise;Therapeutic activities;Balance training;Neuromuscular re-education;Patient/family education;Manual techniques;Passive range of motion;Dry needling;Taping;Vestibular    PT Next Visit Plan  Continue to work on neck if appropriate.  Balance with eyes closed and head turns/nods; gait indoor and outdoor with community challenges: changing gait speed fast <> slow, sudden stops, external perturbations, walking in narrow path, descending stairs with reciprocal pattern    Consulted and Agree with Plan of Care  Patient       Patient will benefit from skilled therapeutic intervention in order to improve the following deficits and impairments:  Abnormal gait, Decreased activity tolerance, Decreased balance, Decreased endurance, Decreased mobility, Decreased range of motion, Decreased strength, Difficulty walking, Dizziness, Impaired sensation, Postural dysfunction, Pain  Visit Diagnosis: Muscle weakness (generalized)  Other disturbances of skin sensation  Dizziness and giddiness  Cervicalgia  Difficulty in walking, not elsewhere classified  Unsteadiness on feet     Problem List There are no active problems to display for this patient.   Dierdre Highman, PT, DPT 03/23/18    1:01 PM    [facility] [address] [place], [place], [address] Phone: [phone]   Fax:  [phone]  Name: Herbert Davis MRN: [id number] Date of Birth: 28-May-1981

## 2018-03-23 NOTE — Therapy (Signed)
[facility] [address] [place], [place], [address] Phone: [phone]   Fax:  [phone]  Occupational Therapy Treatment  Patient Details  Name: Alanda SlimBryan M Loveland MRN: [id number] Date of Birth: [birth year] Referring Provider: Dr. Anne HahnWillis   Encounter Date: 03/23/2018  OT End of Session - 03/23/18 1444    Visit Number  4    Number of Visits  13    Date for OT Re-Evaluation  05/25/18    Authorization Type  BCBS    Authorization Time Period  12 weeks, no more than 4 modailities per insurance    Authorization - Visit Number  4    Authorization - Number of Visits  15    OT Start Time  1319    OT Stop Time  1400    OT Time Calculation (min)  41 min    Activity Tolerance  Patient tolerated treatment well    Behavior During Therapy  [facility] for tasks assessed/performed       No past medical history on file.  No past surgical history on file.  There were no vitals filed for this visit.  Subjective Assessment - 03/23/18 1319    Subjective   left hand pain today, but it has improved    Patient Stated Goals  get movement back in left arm    Currently in Pain?  Yes    Pain Score  2     Pain Location  Hand    Pain Orientation  Left    Pain Descriptors / Indicators  Tingling    Pain Type  Chronic pain    Pain Onset  More than a month ago    Pain Frequency  Intermittent    Aggravating Factors   stretches    Pain Relieving Factors  malpositioning            Treatment: reviewed ulnar nerve Gliding exercises 10 reps Upgraded putty to red for grip and tip pinch, pt continues with yellow for 5th digit adduction, min v.c Gripper set at level 1 to pick up 1 inch blocks with LUE, min difficulty, several rest breaks due to fatigue, min v.c to incorporate 5th digit Simulated cutting food with LUE, min v.c for 5th digit use, Arm bike x 6 mins level 3 for conditioning. Removing large pegs from pegboard with ring and small finger  LUE, min difficulty/ v.c                 OT Short Term Goals - 03/23/18 1324      OT SHORT TERM GOAL #1   Title  I with HEP. due 04/10/18    Status  Achieved      OT SHORT TERM GOAL #2   Title  Pt will demostrate ability to retrieve a lightweight object at 120 shoulder flexion with LUE.    Status  On-going      OT SHORT TERM GOAL #3   Title  Pt will verbalize understanding of LUE positioning to minimize pain and risk for injury.    Status  Achieved      OT SHORT TERM GOAL #4   Title  Pt will verbalize understanding of precautions related to sensory impairment      OT SHORT TERM GOAL #5   Title  Pt will demonstrate improved functional use of LUE as evidenced by decreasing 3 button/ unbutton to 35 secs or less    Status  Achieved 28.19 secs  OT Long Term Goals - 03/23/18 1332      OT LONG TERM GOAL #1   Title  I with updated HEP.    Time  12    Period  Weeks    Status  On-going      OT LONG TERM GOAL #2   Title  Pt will resume use of LUE as dominant hand at least 70% of the time for ADLS/IADLs.    Time  12    Period  Weeks    Status  On-going      OT LONG TERM GOAL #3   Title  Pt will demonstrate improved fine motor coordination as evidenced by decreasing LUE 9 hole peg test score to 25 secs or less.     Time  12    Period  Weeks    Status  On-going      OT LONG TERM GOAL #4   Title  Pt will demonstrate ability to perform full composite finger flexion for increased LUE functional use.    Time  12    Period  Weeks    Status  On-going            Plan - 03/23/18 1350    Clinical Impression Statement  Pt is progressing towards goals. He demonstrates improving LUE functional use and decreased pain.    Occupational Profile and client history currently impacting functional performance  Pt has no significant PMH. 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 Pt ambulates with  left arm swing in an uncontrolled fashion even though he has the ability to flex shoulder to 110*.    Occupational performance deficits (Please refer to evaluation for details):  ADL's;IADL's;Work;Play;Rest and Sleep;Social Participation    Rehab Potential  Fair    Current Impairments/barriers affecting progress:  pain and weakness of unknown etiology(insurance will not pay for MRI of neck), amplified symptoms    OT Frequency  1x / week    OT Duration  12 weeks    OT Treatment/Interventions  Self-care/ADL training;Electrical Stimulation;Therapeutic exercise;Gait Training;Visual/perceptual remediation/compensation;Coping strategies training;Patient/family education;Splinting;Neuromuscular education;Paraffin;Moist Heat;Fluidtherapy;Energy conservation;Building services engineer;Therapeutic activities;Cognitive remediation/compensation;Passive range of motion;Manual Therapy;DME and/or AE instruction;Ultrasound;Contrast Bath;Cryotherapy;Aquatic Therapy    Plan  continue to address LUE functional use and pain reduction    Consulted and Agree with Plan of Care  Patient       Patient will benefit from skilled therapeutic intervention in order to improve the following deficits and impairments:  Decreased knowledge of use of DME, Pain, Impaired flexibility, Abnormal gait, Decreased coordination, Decreased mobility, Impaired sensation, Decreased coping skills, Decreased strength, Decreased range of motion, Decreased endurance, Decreased activity tolerance, Decreased balance, Decreased knowledge of precautions, Decreased safety awareness, Difficulty walking, Impaired perceived functional ability, Impaired UE functional use  Visit Diagnosis: Muscle weakness (generalized)  Other disturbances of skin sensation  Other lack of coordination  Pain in left arm    Problem List There are no active problems to display for this patient.   RINE,KATHRYN 03/23/2018, 2:45 PM  [facility] [address] [place], [place], [address] Phone: [phone]   Fax:  [phone]  Name: DOYL BITTING MRN: [id number] Date of Birth: 1981-05-22

## 2018-03-31 ENCOUNTER — Ambulatory Visit: Payer: BLUE CROSS/BLUE SHIELD | Admitting: Occupational Therapy

## 2018-03-31 ENCOUNTER — Encounter: Payer: Self-pay | Admitting: Physical Therapy

## 2018-03-31 ENCOUNTER — Ambulatory Visit: Payer: BLUE CROSS/BLUE SHIELD | Admitting: Physical Therapy

## 2018-03-31 DIAGNOSIS — R208 Other disturbances of skin sensation: Secondary | ICD-10-CM

## 2018-03-31 DIAGNOSIS — R2681 Unsteadiness on feet: Secondary | ICD-10-CM

## 2018-03-31 DIAGNOSIS — R42 Dizziness and giddiness: Secondary | ICD-10-CM

## 2018-03-31 DIAGNOSIS — M6281 Muscle weakness (generalized): Secondary | ICD-10-CM | POA: Diagnosis not present

## 2018-03-31 DIAGNOSIS — M79602 Pain in left arm: Secondary | ICD-10-CM

## 2018-03-31 DIAGNOSIS — M542 Cervicalgia: Secondary | ICD-10-CM

## 2018-03-31 DIAGNOSIS — R262 Difficulty in walking, not elsewhere classified: Secondary | ICD-10-CM

## 2018-03-31 DIAGNOSIS — R278 Other lack of coordination: Secondary | ICD-10-CM | POA: Diagnosis not present

## 2018-03-31 NOTE — Patient Instructions (Signed)
Sit holding a 2 lbs weight in each hand, raise arm out in front to just above shoulder height, 2 sets of 10 reps  Sit-Hold 2 lbs weight in each hand raise arms out to the side to shoulder height, 2 sets 10 reps  Stop if you have pain

## 2018-03-31 NOTE — Patient Instructions (Signed)
FOR LETTER EXERCISE:  - Side to side increase to 30 repetitions.  Start to work on bringing feet closer together, less hand support on back of chair  - Up and down with foam ball behind head, against wall to help with motion - 10 times

## 2018-03-31 NOTE — Therapy (Signed)
[facility] [address] [place], [place], [address] Phone: [phone]   Fax:  [phone]  Physical Therapy Treatment  Patient Details  Name: Herbert SlimBryan M Blanchard MRN: [id number] Date of Birth: [birth year] Referring Provider: Dr. Anne HahnWillis   Encounter Date: 03/31/2018  PT End of Session - 03/31/18 1210    Visit Number  15    Number of Visits  21 per recertification    Date for PT Re-Evaluation  05/21/18    Authorization Type  BCBS: $20 copay; VL combined with PT, OT, Chiro, in home.  30 VL    Authorization - Visit Number  15 updated due to entry error    Authorization - Number of Visits  30    PT Start Time  1100    PT Stop Time  1145    PT Time Calculation (min)  45 min    Activity Tolerance  Patient tolerated treatment well    Behavior During Therapy  [facility] for tasks assessed/performed       History reviewed. No pertinent past medical history.  History reviewed. No pertinent surgical history.  There were no vitals filed for this visit.  Subjective Assessment - 03/31/18 1102    Subjective  Drove twice x 15 minutes this past week.  Still hard to look left.  OT had pt reaching down to the ground and back up creating some dizziness.    Pertinent History  no PMH    Limitations  Walking    Diagnostic tests  Per neurology notes: The patient has had a normal MRI of the brain, normal audiometric testing, normal ENG evaluation    Patient Stated Goals  Get rid of the dizziness; can deal with neck pain and tingling    Pain Onset  More than a month ago                       [facility] Treatment/Exercise - 03/31/18 1135      Ambulation/Gait   Stairs  Yes    Stairs Assistance  5: Supervision    Stairs Assistance Details (indicate cue type and reason)  cues to stabilize gaze forwards instead of looking down for increased stability    Stair Management Technique  Two rails;Alternating pattern;Forwards    Number  of Stairs  12    Height of Stairs  6      Exercises   Exercises  Knee/Hip      Knee/Hip Exercises: Standing   Step Down  Right;Left;10 reps;Hand Hold: 2;Hand Hold: 1;Step Height: 6";4 sets    Step Down Limitations  with gaze stabilized forwards for increased use of sensory/proprioceptive information to descend stairs with improved stability; also performed standing across blue balance beam tapping forwards and back with eyes closed x 10 reps each.        Vestibular Treatment/Exercise - 03/31/18 1106      X1 Viewing Horizontal   Foot Position  standing feet apart, intermittent UE support on back of chair    Reps  30    Comments  2 sets      X1 Viewing Vertical   Foot Position  standing with UE support on chair and back of head against ball on wall for feedback for pure flexion<>extension    Reps  5    Comments  5 > 10 reps, 2 sets, improved ability to perform continuous flex<>ext         Balance Exercises - 03/31/18 1202  Balance Exercises: Standing   Tandem Stance  Eyes open;Foam/compliant surface;Upper extremity support 2 with external perturbations; R/L foot forwards        PT Education - 03/31/18 1207    Education provided  Yes    Education Details  progression of x1 viewing and feedback for vertical head movements.      Person(s) Educated  Patient    Methods  Explanation;Demonstration    Comprehension  Verbalized understanding;Returned demonstration       PT Short Term Goals - 02/17/18 1224      PT SHORT TERM GOAL #1   Title  Pt will demonstrate independence with updated balance, vestibular, cervical and driving HEP    Time  6    Period  Weeks    Status  New    Target Date  04/06/18      PT SHORT TERM GOAL #2   Title  Pt will improve gait velocity with cane to >/= 2.6 ft/sec    Baseline  2.14 ft/sec with cane    Time  6    Period  Weeks    Status  New    Target Date  04/06/18      PT SHORT TERM GOAL #3   Title  Pt will improve DGI by 4 points to  decrease risk for falls ambulating in the community    Baseline  10/24    Time  6    Period  Weeks    Status  New    Target Date  04/06/18      PT SHORT TERM GOAL #4   Title  Will report 50% improvement in dizziness with ambulation and head turns    Baseline  30-35%    Time  6    Period  Weeks    Status  New    Target Date  04/06/18      PT SHORT TERM GOAL #5   Title  Pt will ambulate 230' indoors without cane with supervision and negotiate 8 stairs with one rail, alternating sequence with supervision and improved control when descending    Time  6    Period  Weeks    Status  New    Target Date  04/06/18        PT Long Term Goals - 02/17/18 1234      PT LONG TERM GOAL #1   Title  Pt will demonstrate independence with neck ROM, vestibular, standing balance HEP    Time  12    Period  Weeks    Status  Revised    Target Date  05/21/18      PT LONG TERM GOAL #2   Title  Pt will report 75% improvement in dizziness with head movements, turning and with gait and will tolerate gaze adaptation exercises in standing (x1 viewing).    Time  12    Period  Weeks    Status  Revised    Target Date  05/21/18      PT LONG TERM GOAL #3   Title  Pt will improve gait velocity to >/=2.8 ft/sec without AD for improved safety with gait in community    Time  12    Period  Weeks    Status  Revised    Target Date  05/21/18      PT LONG TERM GOAL #4   Title  Pt will improve DGI to >/= 18/24    Time  12    Period  Weeks  Status  Revised    Target Date  05/21/18      PT LONG TERM GOAL #6   Title  Pt will ambulate x 250' over outdoor paved surfaces without cane at MOD I level; will report return to driving short distances along well known routes.    Time  12    Period  Weeks    Status  Revised    Target Date  05/21/18            Plan - 03/31/18 1216    Clinical Impression Statement  Pt continues to make progress and was able to demonstrate x1 viewing horizontal without UE  support and able to progress x1 vertical to standing with feedback for continuous, coordinated movement.  Continued to focus on stair negotiation and use of proprioceptive information to safely descend and decreased use of looking down for improved stability.  Also continued to focus on proprioception and balance reactions/postural control on more compliant surfaces and with more narrow BOS.  Pt tolerated well; will continue to address in order to progress towards LTG.    Rehab Potential  Good    PT Frequency  1x / week    PT Duration  12 weeks    PT Treatment/Interventions  ADLs/Self Care Home Management;Canalith Repostioning;Cryotherapy;Moist Heat;Traction;DME Instruction;Gait training;Stair training;Functional mobility training;Therapeutic exercise;Therapeutic activities;Balance training;Neuromuscular re-education;Patient/family education;Manual techniques;Passive range of motion;Dry needling;Taping;Vestibular    PT Next Visit Plan  CHECK STG.  Progress x 1 viewing in standing. (vertical with ball behind head on wall for feedback)  Continue to work on neck if appropriate.  Balance with eyes closed and head turns/nods; gait indoor and outdoor with community challenges: changing gait speed fast <> slow, sudden stops, external perturbations, walking in narrow path, descending stairs with reciprocal pattern    Consulted and Agree with Plan of Care  Patient       Patient will benefit from skilled therapeutic intervention in order to improve the following deficits and impairments:  Abnormal gait, Decreased activity tolerance, Decreased balance, Decreased endurance, Decreased mobility, Decreased range of motion, Decreased strength, Difficulty walking, Dizziness, Impaired sensation, Postural dysfunction, Pain  Visit Diagnosis: Muscle weakness (generalized)  Other disturbances of skin sensation  Dizziness and giddiness  Cervicalgia  Difficulty in walking, not elsewhere classified  Unsteadiness on  feet     Problem List There are no active problems to display for this patient.   Dierdre Highman, PT, DPT 03/31/18    12:28 PM    [facility] [address] [place], [place], [address] Phone: [phone]   Fax:  [phone]  Name: CLARANCE BOLLARD MRN: [id number] Date of Birth: 28-Sep-1980

## 2018-03-31 NOTE — Therapy (Signed)
[facility] [address] [place], [place], [address] Phone: [phone]   Fax:  [phone]  Occupational Therapy Treatment  Patient Details  Name: Herbert Davis MRN: [id number] Date of Birth: 1981-05-26 Referring Provider: Dr. Anne HahnWillis   Encounter Date: 03/31/2018  OT End of Session - 03/31/18 1023    Visit Number  5    Number of Visits  13    Date for OT Re-Evaluation  05/25/18    Authorization Type  BCBS    Authorization Time Period  12 weeks, no more than 4 modailities per insurance    Authorization - Visit Number  5    Authorization - Number of Visits  15    OT Start Time  1018    OT Stop Time  1100    OT Time Calculation (min)  42 min    Activity Tolerance  Patient tolerated treatment well    Behavior During Therapy  [facility] for tasks assessed/performed       No past medical history on file.  No past surgical history on file.  There were no vitals filed for this visit.  Subjective Assessment - 03/31/18 1020    Subjective   Pt reports ability to use LUE more    Pertinent History  neck pain, dizziness    Patient Stated Goals  get movement back in left arm    Currently in Pain?  Yes    Pain Score  2     Pain Location  Arm    Pain Orientation  Left    Pain Descriptors / Indicators  Aching    Pain Type  Chronic pain    Pain Onset  More than a month ago    Pain Frequency  Intermittent    Aggravating Factors   stretches    Pain Relieving Factors  malpositioning            Treatment:Reveiwed ulnar n. Gliding exercises, 10 reps each, min v.c initally. Functional reaching mid to high range placing and removing large pegs with middle , ring and small fingers,  Then placing in a lower container to left side, close supervision and min v.c due to pt LOB, (pt recovered with min v.c, he demonstrates posterior sway). Seated functional reaching to place graded clothespins on vertical antennae, with sustained pinch  using ring and small finger, min v.c several rest breaks Pt was instructed in HEP, see pt instructions Arm bike x 6 mins level 3 for conditioning                 OT Short Term Goals - 03/31/18 1023      OT SHORT TERM GOAL #1   Title  I with HEP. due 04/10/18    Status  Achieved      OT SHORT TERM GOAL #2   Title  Pt will demostrate ability to retrieve a lightweight object at 120 shoulder flexion with LUE.    Status  Achieved      OT SHORT TERM GOAL #3   Title  Pt will verbalize understanding of LUE positioning to minimize pain and risk for injury.    Status  Achieved      OT SHORT TERM GOAL #4   Title  Pt will verbalize understanding of precautions related to sensory impairment    Status  Achieved      OT SHORT TERM GOAL #5   Title  Pt will demonstrate improved functional use of LUE as evidenced by  decreasing 3 button/ unbutton to 35 secs or less    Status  Achieved 28.19 secs        OT Long Term Goals - 03/23/18 1332      OT LONG TERM GOAL #1   Title  I with updated HEP.    Time  12    Period  Weeks    Status  On-going      OT LONG TERM GOAL #2   Title  Pt will resume use of LUE as dominant hand at least 70% of the time for ADLS/IADLs.    Time  12    Period  Weeks    Status  On-going      OT LONG TERM GOAL #3   Title  Pt will demonstrate improved fine motor coordination as evidenced by decreasing LUE 9 hole peg test score to 25 secs or less.     Time  12    Period  Weeks    Status  On-going      OT LONG TERM GOAL #4   Title  Pt will demonstrate ability to perform full composite finger flexion for increased LUE functional use.    Time  12    Period  Weeks    Status  On-going            Plan - 03/31/18 1024    Clinical Impression Statement  Pt is progressing towards goals. He reports improved ability to use LUE functionally    Occupational Profile and client history currently impacting functional performance  Pt has no significant PMH. 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 Pt ambulates with left arm swing in an uncontrolled fashion even though he has the ability to flex shoulder to 110*.    Rehab Potential  Fair    OT Frequency  1x / week    OT Duration  12 weeks    OT Treatment/Interventions  Self-care/ADL training;Electrical Stimulation;Therapeutic exercise;Gait Training;Visual/perceptual remediation/compensation;Coping strategies training;Patient/family education;Splinting;Neuromuscular education;Paraffin;Moist Heat;Fluidtherapy;Energy conservation;Building services engineer;Therapeutic activities;Cognitive remediation/compensation;Passive range of motion;Manual Therapy;DME and/or AE instruction;Ultrasound;Contrast Bath;Cryotherapy;Aquatic Therapy    Plan  continue to address LUE functional use and pain reduction    Clinical Decision Making  Limited treatment options, no task modification necessary    Consulted and Agree with Plan of Care  Patient       Patient will benefit from skilled therapeutic intervention in order to improve the following deficits and impairments:  Decreased knowledge of use of DME, Pain, Impaired flexibility, Abnormal gait, Decreased coordination, Decreased mobility, Impaired sensation, Decreased coping skills, Decreased strength, Decreased range of motion, Decreased endurance, Decreased activity tolerance, Decreased balance, Decreased knowledge of precautions, Decreased safety awareness, Difficulty walking, Impaired perceived functional ability, Impaired UE functional use  Visit Diagnosis: Muscle weakness (generalized)  Other disturbances of skin sensation  Other lack of coordination  Pain in left arm    Problem List There are no active problems to display for this patient.   Herbert Davis 03/31/2018, 10:49 AM  [facility] [address] [place],  [place], [address] Phone: [phone]   Fax:  [phone]  Name: Herbert Davis MRN: [id number] Date of Birth: [birth year]

## 2018-04-07 ENCOUNTER — Encounter: Payer: Self-pay | Admitting: Occupational Therapy

## 2018-04-07 ENCOUNTER — Ambulatory Visit: Payer: BLUE CROSS/BLUE SHIELD

## 2018-04-07 ENCOUNTER — Ambulatory Visit: Payer: BLUE CROSS/BLUE SHIELD | Admitting: Occupational Therapy

## 2018-04-07 DIAGNOSIS — R208 Other disturbances of skin sensation: Secondary | ICD-10-CM

## 2018-04-07 DIAGNOSIS — R262 Difficulty in walking, not elsewhere classified: Secondary | ICD-10-CM | POA: Diagnosis not present

## 2018-04-07 DIAGNOSIS — R42 Dizziness and giddiness: Secondary | ICD-10-CM | POA: Diagnosis not present

## 2018-04-07 DIAGNOSIS — R2681 Unsteadiness on feet: Secondary | ICD-10-CM

## 2018-04-07 DIAGNOSIS — M6281 Muscle weakness (generalized): Secondary | ICD-10-CM

## 2018-04-07 DIAGNOSIS — M79602 Pain in left arm: Secondary | ICD-10-CM | POA: Diagnosis not present

## 2018-04-07 DIAGNOSIS — R278 Other lack of coordination: Secondary | ICD-10-CM

## 2018-04-07 DIAGNOSIS — M542 Cervicalgia: Secondary | ICD-10-CM | POA: Diagnosis not present

## 2018-04-07 NOTE — Patient Instructions (Signed)
  Strengthening: Resisted Flexion   Hold tubing with _each____ arm(s) at side. Pull forward and up. Move shoulder through pain-free range of motion. Repeat __10__ times per set.  Do _1-2_ sessions per day , every other day   Strengthening: Resisted Extension   Hold tubing in __each___ hand(s), arm forward. Pull arm back, elbow straight. Repeat _10___ times per set. Do _1-2___ sessions per day, every other day.   Resisted Horizontal Abduction: Bilateral   Sit or stand, tubing in both hands, arms out in front. Keeping arms straight, pinch shoulder blades together and stretch arms out. Repeat _10___ times per set. Do _1-2___ sessions per day, every other day.   Elbow Flexion: Resisted   With tubing held in _each_____ hand(s) and other end secured under foot, curl arm up as far as possible. Repeat _10___ times per set. Do _1-2___ sessions per day, every other day.    Elbow Extension: Resisted   Sit in chair with resistive band secured at armrest (or hold with other hand) and ____each___ elbow bent. Straighten elbow. Repeat _10___ times per set.  Do _1-2___ sessions per day, every other day.   Copyright  VHI. All rights reserved.   

## 2018-04-07 NOTE — Therapy (Signed)
[facility] [address] [place], [place], [address] Phone: [phone]   Fax:  [phone]  Occupational Therapy Treatment  Patient Details  Name: Herbert Davis MRN: [id number] Date of Birth: 01-28-1981 Referring Provider: Dr. Anne Hahn   Encounter Date: 04/07/2018  OT End of Session - 04/07/18 1342    Visit Number  6    Number of Visits  13    Date for OT Re-Evaluation  05/25/18    Authorization Type  BCBS    Authorization Time Period  12 weeks, no more than 4 modailities per insurance    Authorization - Visit Number  6    Authorization - Number of Visits  15    OT Start Time  1320    OT Stop Time  1400    OT Time Calculation (min)  40 min    Activity Tolerance  Patient tolerated treatment well    Behavior During Therapy  [facility] for tasks assessed/performed       History reviewed. No pertinent past medical history.  History reviewed. No pertinent surgical history.  There were no vitals filed for this visit.  Subjective Assessment - 04/07/18 1355    Subjective   Pt reports ability to use LUE more    Patient Stated Goals  get movement back in left arm    Currently in Pain?  No/denies                 Treatment: ulnar n. Gliding x 10 reps, see pt education section for HEP. Arm bike x 6 mins level 4 for conditioning Graded clothespins with ring and small finger for sustained pinch, only min difficulty with  black clothespins Grooved pegboard for increased fine motor coordination, pt demo improving coordination. Manipulating coins in hand to place in container, then rotating ball min difficulty/ v.c           OT Education - 04/07/18 1325    Education Details  Upgraded to green putty for composite grip HEP, continue with red putty for pinch and 5th digit adduction,  red theraband HEP 10-20 reps each, min v.c and demonstration, A/ROM shoulder flexion with 2 lbs weight in each hand    Person(s)  Educated  Patient    Methods  Explanation;Demonstration    Comprehension  Verbalized understanding;Returned demonstration;Verbal cues required       OT Short Term Goals - 03/31/18 1023      OT SHORT TERM GOAL #1   Title  I with HEP. due 04/10/18    Status  Achieved      OT SHORT TERM GOAL #2   Title  Pt will demostrate ability to retrieve a lightweight object at 120 shoulder flexion with LUE.    Status  Achieved      OT SHORT TERM GOAL #3   Title  Pt will verbalize understanding of LUE positioning to minimize pain and risk for injury.    Status  Achieved      OT SHORT TERM GOAL #4   Title  Pt will verbalize understanding of precautions related to sensory impairment    Status  Achieved      OT SHORT TERM GOAL #5   Title  Pt will demonstrate improved functional use of LUE as evidenced by decreasing 3 button/ unbutton to 35 secs or less    Status  Achieved 28.19 secs        OT Long Term Goals - 04/07/18 1317  OT LONG TERM GOAL #1   Title  I with updated HEP. due 05/25/18    Time  12    Period  Weeks    Status  On-going red theraband issued check on perfromance, green putty for composite grip      OT LONG TERM GOAL #2   Title  Pt will resume use of LUE as dominant hand at least 70% of the time for ADLS/IADLs.    Time  12    Period  Weeks    Status  On-going 50-60%      OT LONG TERM GOAL #3   Title  Pt will demonstrate improved fine motor coordination as evidenced by decreasing LUE 9 hole peg test score to 25 secs or less.     Time  12    Period  Weeks    Status  On-going 26.22 secs      OT LONG TERM GOAL #4   Title  Pt will demonstrate ability to perform full composite finger flexion for increased LUE functional use.    Time  12    Period  Weeks    Status  Achieved            Plan - 04/07/18 1344    Clinical Impression Statement  Pt is progressing towards goals. He denies pain and demonstrates continued improvements in functional use    Occupational  Profile and client history currently impacting functional performance  Pt has no significant PMH. 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 Pt ambulates with left arm swing in an uncontrolled fashion even though he has the ability to flex shoulder to 110*.    Occupational performance deficits (Please refer to evaluation for details):  ADL's;IADL's;Work;Play;Rest and Sleep;Social Participation    Rehab Potential  Fair    Current Impairments/barriers affecting progress:  pain and weakness of unknown etiology(insurance will not pay for MRI of neck), amplified symptoms    OT Frequency  1x / week    OT Duration  12 weeks    OT Treatment/Interventions  Self-care/ADL training;Electrical Stimulation;Therapeutic exercise;Gait Training;Visual/perceptual remediation/compensation;Coping strategies training;Patient/family education;Splinting;Neuromuscular education;Paraffin;Moist Heat;Fluidtherapy;Energy conservation;Building services engineerunctional Mobility Training;Therapeutic activities;Cognitive remediation/compensation;Passive range of motion;Manual Therapy;DME and/or AE instruction;Ultrasound;Contrast Bath;Cryotherapy;Aquatic Therapy    Plan  continue to address LUE functional use and pain reduction, check to see how red theraband ex are going    OT Home Exercise Plan  ulnar n. gloiding,putty(yellow, red, greeen), red theraband, shoulder flexion with 2lbs weight    Consulted and Agree with Plan of Care  Patient       Patient will benefit from skilled therapeutic intervention in order to improve the following deficits and impairments:  Decreased knowledge of use of DME, Pain, Impaired flexibility, Abnormal gait, Decreased coordination, Decreased mobility, Impaired sensation, Decreased coping skills, Decreased strength, Decreased range of motion, Decreased endurance, Decreased activity tolerance, Decreased balance, Decreased knowledge of precautions, Decreased  safety awareness, Difficulty walking, Impaired perceived functional ability, Impaired UE functional use  Visit Diagnosis: Muscle weakness (generalized)  Other disturbances of skin sensation  Other lack of coordination  Pain in left arm  Unsteadiness on feet    Problem List There are no active problems to display for this patient.   Herbert Davis 04/07/2018, 1:55 PM  [facility] [address] [place], [place], [address] Phone: [phone]   Fax:  [phone]  Name: Herbert Davis MRN: [id number] Date of Birth:  12/19/1980 

## 2018-04-14 ENCOUNTER — Ambulatory Visit: Payer: BLUE CROSS/BLUE SHIELD | Admitting: Physical Therapy

## 2018-04-14 ENCOUNTER — Encounter: Payer: Self-pay | Admitting: Physical Therapy

## 2018-04-14 DIAGNOSIS — M542 Cervicalgia: Secondary | ICD-10-CM

## 2018-04-14 DIAGNOSIS — R208 Other disturbances of skin sensation: Secondary | ICD-10-CM

## 2018-04-14 DIAGNOSIS — M6281 Muscle weakness (generalized): Secondary | ICD-10-CM

## 2018-04-14 DIAGNOSIS — R262 Difficulty in walking, not elsewhere classified: Secondary | ICD-10-CM | POA: Diagnosis not present

## 2018-04-14 DIAGNOSIS — R2681 Unsteadiness on feet: Secondary | ICD-10-CM

## 2018-04-14 DIAGNOSIS — M79602 Pain in left arm: Secondary | ICD-10-CM | POA: Diagnosis not present

## 2018-04-14 DIAGNOSIS — R42 Dizziness and giddiness: Secondary | ICD-10-CM

## 2018-04-14 DIAGNOSIS — R278 Other lack of coordination: Secondary | ICD-10-CM | POA: Diagnosis not present

## 2018-04-14 NOTE — Patient Instructions (Signed)
   Seated Bow-tie Head movements:  Start by looking up and to one side, look down and across, up, down and across, up, down and across in shape of a bow-tie. Repeat 10 times   SIT TO STAND: Feet Narrow    Place feet together. Lean chest forward. Raise hips and straighten knees to stand.  Come down slowly to a count of 6.   Then put feet on pillow, hip distance apart.  Repeat exercise. _10__ reps per set, _2__ sets per day   Gaze Stabilization: Tip Card  1.Target must remain in focus, not blurry, and appear stationary while head is in motion. 2.Perform exercises with small head movements (45 to either side of midline). 3.Increase speed of head motion so long as target is in focus. 4.If you wear eyeglasses, be sure you can see target through lens (therapist will give specific instructions for bifocal / progressive lenses). 5.These exercises may provoke dizziness or nausea. Work through these symptoms. If too dizzy, slow head movement slightly. Rest between each exercise. 6.Exercises demand concentration; avoid distractions.  Gaze Stabilization: Standing Feet Apart    Feet together: perform side to side increasing from 30 repetitions to 40.  Then stand with feet hip width apart, perform head up and down 10 times progressing to 20 repetitions.  Try to perform without holding on to chair Do __2-3__ sessions per day.  ________________________________________________________________________________________________________________________Will review rest of exercises next session:  Compensatory Strategies: Corrective Saccades    1. Sit in a supportive chair with back support, arm support and feet supported on floor.    Tape stationary targets (playing cards) on a blank wall placed __12__ inches apart up/down, left/right and diagonal, move eyes to target, keep head still. 2. Then move head in direction of target while eyes remain on target. 3/4. Repeat in opposite direction.  Right  to left, up to down, diagonals Perform sitting. Repeat sequence __10__ times per session each direction. Do _1-2___ sessions per day.    Feet Together, Head Motion - Eyes Open    Standing with your back to a corner and chair in front for support.With eyes open, feet together, move head slowly:side to side. Repeat10times per session. Do 2sessions per day.  Balance: Eyes Closed - Bilateral (Varied Surfaces)    Stand, feet shoulder width, close eyes, hands on the back of the chair for support. Maintain balance10-15seconds. Repeat 3times per set. Try to keep your weight in the middle of your feet. Try to work up to holding for 30 seconds.   Feet Apart (Compliant Surface) Arm Motion - Eyes Open    With eyes open, standing on compliant surface: ___pillow/cushion_____, feet shoulder width apart, arms by your side. Repeat __3__ times per session for 15 (work your way up to 30 secs) secs each. Do __2__ sessions per day.

## 2018-04-14 NOTE — Therapy (Signed)
[facility] [address] [place], [place], [address] Phone: [phone]   Fax:  [phone]  Physical Therapy Treatment  Patient Details  Name: Herbert Davis MRN: [id number] Date of Birth: November 05, 1980 Referring Provider: Dr. Jannifer Franklin   Encounter Date: 04/14/2018  PT End of Session - 04/14/18 1152    Visit Number  16    Number of Visits  21 per recertification    Date for PT Re-Evaluation  05/21/18    Authorization Type  [employer]: $20 copay; VL combined with PT, OT, Chiro, in home.  30 VL    Authorization - Visit Number  16 updated due to entry error    Authorization - Number of Visits  30    PT Start Time  1109    PT Stop Time  1150    PT Time Calculation (min)  41 min    Activity Tolerance  Patient tolerated treatment well    Behavior During Therapy  [facility] for tasks assessed/performed       History reviewed. No pertinent past medical history.  History reviewed. No pertinent surgical history.  There were no vitals filed for this visit.  Subjective Assessment - 04/14/18 1112    Subjective  Drove on the road yesterday in a quiet neighborhood but still had a hard time looking for blind spots on the L.     Pertinent History  no PMH    Limitations  Walking    Diagnostic tests  Per neurology notes: The patient has had a normal MRI of the brain, normal audiometric testing, normal ENG evaluation    Patient Stated Goals  Get rid of the dizziness; can deal with neck pain and tingling    Currently in Pain?  No/denies    Pain Onset  More than a month ago         [facility] PT Assessment - 04/14/18 1116      Standardized Balance Assessment   Standardized Balance Assessment  10 meter walk test;Dynamic Gait Index    10 Meter Walk  11.6 seconds without cane or 2.8 ft/sec      Dynamic Gait Index   Level Surface  Mild Impairment    Change in Gait Speed  Mild Impairment    Gait with Horizontal Head Turns  Moderate Impairment    Gait  with Vertical Head Turns  Moderate Impairment    Gait and Pivot Turn  Mild Impairment    Step Over Obstacle  Normal    Step Around Obstacles  Normal    Steps  Mild Impairment    Total Score  16    DGI comment:  16/24 without cane      Adjusted and added the following exercises to HEP; removed sit <> stand with cane and reaching to floor to pick up objects from sitting; also removed seated head tilts due to progress.     Seated Bow-tie Head movements:  Start by looking up and to one side, look down and across, up, down and across, up, down and across in shape of a bow-tie. Repeat 10 times   SIT TO STAND: Feet Narrow    Place feet together. Lean chest forward. Raise hips and straighten knees to stand.  Come down slowly to a count of 6.   Then put feet on pillow, hip distance apart.  Repeat exercise. _10__ reps per set, _2__ sets per day   Gaze Stabilization: Tip Card  1.Target must remain in focus, not blurry, and  appear stationary while head is in motion. 2.Perform exercises with small head movements (45 to either side of midline). 3.Increase speed of head motion so long as target is in focus. 4.If you wear eyeglasses, be sure you can see target through lens (therapist will give specific instructions for bifocal / progressive lenses). 5.These exercises may provoke dizziness or nausea. Work through these symptoms. If too dizzy, slow head movement slightly. Rest between each exercise. 6.Exercises demand concentration; avoid distractions.  Gaze Stabilization: Standing Feet Apart    Feet together: perform side to side increasing from 30 repetitions to 40.  Then stand with feet hip width apart, perform head up and down 10 times progressing to 20 repetitions.  Try to perform without holding on to chair Do __2-3__ sessions per day      PT Education - 04/14/18 1152    Education provided  Yes    Education Details  progress towards goals    Person(s) Educated  Patient     Methods  Explanation;Demonstration;Handout    Comprehension  Verbalized understanding;Returned demonstration       PT Short Term Goals - 04/14/18 1115      PT SHORT TERM GOAL #1   Title  Pt will demonstrate independence with updated balance, vestibular, cervical and driving HEP    Time  6    Period  Weeks    Status  Achieved      PT SHORT TERM GOAL #2   Title  Pt will improve gait velocity with cane to >/= 2.6 ft/sec    Baseline  2.8 ft/sec without the cane    Time  6    Period  Weeks    Status  Achieved      PT SHORT TERM GOAL #3   Title  Pt will improve DGI by 4 points to decrease risk for falls ambulating in the community    Baseline  16/24    Time  6    Period  Weeks    Status  Achieved      PT SHORT TERM GOAL #4   Title  Will report 50% improvement in dizziness with ambulation and head turns    Baseline  60% improvement    Time  6    Period  Weeks    Status  Achieved      PT SHORT TERM GOAL #5   Title  Pt will ambulate 230' indoors without cane with supervision and negotiate 8 stairs with one rail, alternating sequence with supervision and improved control when descending    Baseline  >230' without cane indoors, 8 stairs with one rail, alternating sequence with supervision    Time  6    Period  Weeks    Status  Achieved        PT Long Term Goals - 02/17/18 1234      PT LONG TERM GOAL #1   Title  Pt will demonstrate independence with neck ROM, vestibular, standing balance HEP    Time  12    Period  Weeks    Status  Revised    Target Date  05/21/18      PT LONG TERM GOAL #2   Title  Pt will report 75% improvement in dizziness with head movements, turning and with gait and will tolerate gaze adaptation exercises in standing (x1 viewing).    Time  12    Period  Weeks    Status  Revised    Target Date  05/21/18  PT LONG TERM GOAL #3   Title  Pt will improve gait velocity to >/=2.8 ft/sec without AD for improved safety with gait in community    Time   12    Period  Weeks    Status  Revised    Target Date  05/21/18      PT LONG TERM GOAL #4   Title  Pt will improve DGI to >/= 18/24    Time  12    Period  Weeks    Status  Revised    Target Date  05/21/18      PT LONG TERM GOAL #6   Title  Pt will ambulate x 250' over outdoor paved surfaces without cane at MOD I level; will report return to driving short distances along well known routes.    Time  12    Period  Weeks    Status  Revised    Target Date  05/21/18            Plan - 04/14/18 1153    Clinical Impression Statement  Performed assessment of progress towards STG.  Pt is making good progress and has met 5/5 STG.  Pt demonstrates improved posture and head/neck position and more normal BOS during gait, increased gait velocity without cane, improved stability and safety during gait without cane and improved tolerance of head movement and vestibular HEP; pt has also initiated driving on calm roads.  Due to progress initiated adjustment and progression of HEP.  Will continue at next session in order to continue to progress towards LTG.    Rehab Potential  Good    PT Frequency  1x / week    PT Duration  12 weeks    PT Treatment/Interventions  ADLs/Self Care Home Management;Canalith Repostioning;Cryotherapy;Moist Heat;Traction;DME Instruction;Gait training;Stair training;Functional mobility training;Therapeutic exercise;Therapeutic activities;Balance training;Neuromuscular re-education;Patient/family education;Manual techniques;Passive range of motion;Dry needling;Taping;Vestibular    PT Next Visit Plan  Continue to update HEP: corner balance, walking with head turns.   Balance with eyes closed and head turns/nods; gait indoor and outdoor with community challenges: changing gait speed fast <> slow, sudden stops, external perturbations, walking in narrow path, descending stairs with reciprocal pattern    Consulted and Agree with Plan of Care  Patient       Patient will benefit from  skilled therapeutic intervention in order to improve the following deficits and impairments:  Abnormal gait, Decreased activity tolerance, Decreased balance, Decreased endurance, Decreased mobility, Decreased range of motion, Decreased strength, Difficulty walking, Dizziness, Impaired sensation, Postural dysfunction, Pain  Visit Diagnosis: Dizziness and giddiness  Cervicalgia  Unsteadiness on feet  Difficulty in walking, not elsewhere classified  Other disturbances of skin sensation  Muscle weakness (generalized)     Problem List There are no active problems to display for this patient.   Rico Junker, PT, DPT 04/14/18    11:59 AM    [facility] [address] [place], [place], [address] Phone: [phone]   Fax:  [phone]  Name: Herbert Davis MRN: [id number] Date of Birth: 1980-09-30

## 2018-04-21 ENCOUNTER — Ambulatory Visit: Payer: BLUE CROSS/BLUE SHIELD | Attending: Neurology | Admitting: Occupational Therapy

## 2018-04-21 ENCOUNTER — Encounter: Payer: Self-pay | Admitting: Physical Therapy

## 2018-04-21 ENCOUNTER — Ambulatory Visit: Payer: BLUE CROSS/BLUE SHIELD | Admitting: Physical Therapy

## 2018-04-21 DIAGNOSIS — R278 Other lack of coordination: Secondary | ICD-10-CM | POA: Insufficient documentation

## 2018-04-21 DIAGNOSIS — M542 Cervicalgia: Secondary | ICD-10-CM | POA: Diagnosis not present

## 2018-04-21 DIAGNOSIS — R29898 Other symptoms and signs involving the musculoskeletal system: Secondary | ICD-10-CM | POA: Insufficient documentation

## 2018-04-21 DIAGNOSIS — R42 Dizziness and giddiness: Secondary | ICD-10-CM | POA: Insufficient documentation

## 2018-04-21 DIAGNOSIS — M6281 Muscle weakness (generalized): Secondary | ICD-10-CM | POA: Diagnosis not present

## 2018-04-21 DIAGNOSIS — M79602 Pain in left arm: Secondary | ICD-10-CM | POA: Diagnosis not present

## 2018-04-21 DIAGNOSIS — R262 Difficulty in walking, not elsewhere classified: Secondary | ICD-10-CM | POA: Insufficient documentation

## 2018-04-21 DIAGNOSIS — R2681 Unsteadiness on feet: Secondary | ICD-10-CM | POA: Diagnosis not present

## 2018-04-21 DIAGNOSIS — R208 Other disturbances of skin sensation: Secondary | ICD-10-CM

## 2018-04-21 NOTE — Patient Instructions (Signed)
   Seated Bow-tie Head movements:  Start by looking up and to one side, look down and across, up, down and across, up, down and across in shape of a bow-tie. Repeat 10 times   SIT TO STAND: Feet Narrow    Place feet together. Lean chest forward. Raise hips and straighten knees to stand.  Come down slowly to a count of 6.   Then put feet on pillow, hip distance apart.  Repeat exercise. _10__ reps per set, _2__ sets per day   Gaze Stabilization: Tip Card  1.Target must remain in focus, not blurry, and appear stationary while head is in motion. 2.Perform exercises with small head movements (45 to either side of midline). 3.Increase speed of head motion so long as target is in focus. 4.If you wear eyeglasses, be sure you can see target through lens (therapist will give specific instructions for bifocal / progressive lenses). 5.These exercises may provoke dizziness or nausea. Work through these symptoms. If too dizzy, slow head movement slightly. Rest between each exercise. 6.Exercises demand concentration; avoid distractions.  Gaze Stabilization: Standing Feet Apart    Blank background - feet together, side to side 60 seconds.  Up and down 30 seconds.  Stand on a pillow - feet apart - side to side 60 seconds, try up and down 30 seconds.  Busy background - feet apart - chair for support if needed - side to side 60 seconds, up and down 30 seconds  Do __2-3__ sessions per day.  __________________________________________________________________________________________________________________________  Seated Eye, Head and Arm movement:    Setup  Begin sitting upright with one arm holding a notecard with a letter written on it out in front of your face.  Movement  Keeping your eyes focused on the letter, slowly rotate your head to one side while moving the notecard to the opposite side, then repeat in the other direction.  Tip  Make sure to sit tall and avoid turning your  trunk as you move your head. Move your head as fast as you can while keeping the letter in focus.    Start with 3 repetitions side to side, up and down.  Work towards 5-6 repetitions by next week.  _______________________________________________________________________________________________________________________________________ Walking FORWARDS AND BACKWARDS CHANGING SPEED:  Walk forwards down your hallway starting at regular speed >> fast speed >> slow speed every 3-4 cycles of steps.  Repeat going backwards.  Perform 4 laps down hallway.

## 2018-04-21 NOTE — Therapy (Signed)
[facility] [address] [place], [place], [address] Phone: [phone]   Fax:  [phone]  Physical Therapy Treatment  Patient Details  Name: Herbert Davis MRN: [id number] Date of Birth: 1980-11-17 Referring Provider: Dr. Anne HahnWillis   Encounter Date: 04/21/2018  PT End of Session - 04/21/18 1217    Visit Number  17    Number of Visits  21 per recertification    Date for PT Re-Evaluation  05/21/18    Authorization Type  [employer]: $20 copay; VL combined with PT, OT, Chiro, in home.  30 VL    Authorization - Visit Number  17 updated due to entry error    Authorization - Number of Visits  30    PT Start Time  1020    PT Stop Time  1100    PT Time Calculation (min)  40 min    Activity Tolerance  Patient tolerated treatment well    Behavior During Therapy  WFL for tasks assessed/performed       History reviewed. No pertinent past medical history.  History reviewed. No pertinent surgical history.  There were no vitals filed for this visit.  Subjective Assessment - 04/21/18 1022    Subjective  Went driving 2x this week on the road, at a busy intersection, looking L and R pt felt dizzy and had to pull over.      Pertinent History  no PMH    Limitations  Walking    Diagnostic tests  Per neurology notes: The patient has had a normal MRI of the brain, normal audiometric testing, normal ENG evaluation    Patient Stated Goals  Get rid of the dizziness; can deal with neck pain and tingling    Currently in Pain?  Yes    Pain Score  1     Pain Location  Hand    Pain Orientation  Left    Pain Descriptors / Indicators  Tingling    Pain Onset  More than a month ago                        Vestibular Treatment/Exercise - 04/21/18 1027      Vestibular Treatment/Exercise   Vestibular Treatment Provided  Gaze    Gaze Exercises  X1 Viewing Horizontal;X1 Viewing Vertical;X2 Viewing Horizontal;X2 Viewing Vertical       X1 Viewing Horizontal   Foot Position  standing feet apart with busy background, feet together blank background, compliant surface feet apart blank background    Reps  3    Comments  progressed to 60 seconds      X1 Viewing Vertical   Foot Position  standing feet apart with busy background, feet together blank background    Reps  2    Comments  progressed to 30 seconds      X2 Viewing Horizontal   Foot Position  seated    Reps  5     Comments  x 2 sets in supported sitting (arm chair)      X2 Viewing Vertical   Foot Position  seated    Reps  3    Comments  x 2 sets in supported sitting (arm chair) - increased dizziness after vertical x 2 viewing         Balance Exercises - 04/21/18 1058      Balance Exercises: Standing   Other Standing Exercises  Performed walking forwards and backwards down hallway changing gait speed regular >  fast > slow  x 2 laps         PT Education - 04/21/18 1216    Education provided  Yes    Education Details  progression of x1 viewing, addition of x2 viewing and gait challenges.  patient's long term plans for returning to apartment and employment    Person(s) Educated  Patient    Methods  Explanation;Demonstration;Handout    Comprehension  Verbalized understanding;Returned demonstration       PT Short Term Goals - 04/14/18 1115      PT SHORT TERM GOAL #1   Title  Pt will demonstrate independence with updated balance, vestibular, cervical and driving HEP    Time  6    Period  Weeks    Status  Achieved      PT SHORT TERM GOAL #2   Title  Pt will improve gait velocity with cane to >/= 2.6 ft/sec    Baseline  2.8 ft/sec without the cane    Time  6    Period  Weeks    Status  Achieved      PT SHORT TERM GOAL #3   Title  Pt will improve DGI by 4 points to decrease risk for falls ambulating in the community    Baseline  16/24    Time  6    Period  Weeks    Status  Achieved      PT SHORT TERM GOAL #4   Title  Will report 50%  improvement in dizziness with ambulation and head turns    Baseline  60% improvement    Time  6    Period  Weeks    Status  Achieved      PT SHORT TERM GOAL #5   Title  Pt will ambulate 230' indoors without cane with supervision and negotiate 8 stairs with one rail, alternating sequence with supervision and improved control when descending    Baseline  >230' without cane indoors, 8 stairs with one rail, alternating sequence with supervision    Time  6    Period  Weeks    Status  Achieved        PT Long Term Goals - 02/17/18 1234      PT LONG TERM GOAL #1   Title  Pt will demonstrate independence with neck ROM, vestibular, standing balance HEP    Time  12    Period  Weeks    Status  Revised    Target Date  05/21/18      PT LONG TERM GOAL #2   Title  Pt will report 75% improvement in dizziness with head movements, turning and with gait and will tolerate gaze adaptation exercises in standing (x1 viewing).    Time  12    Period  Weeks    Status  Revised    Target Date  05/21/18      PT LONG TERM GOAL #3   Title  Pt will improve gait velocity to >/=2.8 ft/sec without AD for improved safety with gait in community    Time  12    Period  Weeks    Status  Revised    Target Date  05/21/18      PT LONG TERM GOAL #4   Title  Pt will improve DGI to >/= 18/24    Time  12    Period  Weeks    Status  Revised    Target Date  05/21/18  PT LONG TERM GOAL #6   Title  Pt will ambulate x 250' over outdoor paved surfaces without cane at MOD I level; will report return to driving short distances along well known routes.    Time  12    Period  Weeks    Status  Revised    Target Date  05/21/18            Plan - 04/21/18 1217    Clinical Impression Statement  Greatest barrier for patient returning to work and independent living in apartment is ability to drive which is limited by dizziness brought on by head movements up/down and to L as well as visually distracting  environments.  Treatment session today focused on progression of x1 viewing to incorporate busy background and introduced x 2 viewing in sitting which significantly increased patient's symptoms.  Will progress gradually.  Also incorporated more dynamic gait challenges into HEP.  Discussed leisure activities and will begin to work towards incorporating into treatment and HEP.      Rehab Potential  Good    PT Frequency  1x / week    PT Duration  12 weeks    PT Treatment/Interventions  ADLs/Self Care Home Management;Canalith Repostioning;Cryotherapy;Moist Heat;Traction;DME Instruction;Gait training;Stair training;Functional mobility training;Therapeutic exercise;Therapeutic activities;Balance training;Neuromuscular re-education;Patient/family education;Manual techniques;Passive range of motion;Dry needling;Taping;Vestibular    PT Next Visit Plan  progress x 1 viewing with busy background and x 2 viewing in sitting; incorporate basketball activities (pt used to play pick up basketball); gait indoor and outdoor with community challenges: changing gait speed fast <> slow, sudden stops, external perturbations, walking in narrow path, descending stairs with reciprocal pattern    Consulted and Agree with Plan of Care  Patient       Patient will benefit from skilled therapeutic intervention in order to improve the following deficits and impairments:  Abnormal gait, Decreased activity tolerance, Decreased balance, Decreased endurance, Decreased mobility, Decreased range of motion, Decreased strength, Difficulty walking, Dizziness, Impaired sensation, Postural dysfunction, Pain  Visit Diagnosis: Muscle weakness (generalized)  Dizziness and giddiness  Cervicalgia  Unsteadiness on feet  Difficulty in walking, not elsewhere classified  Other disturbances of skin sensation     Problem List There are no active problems to display for this patient.   Dierdre Highman, PT, DPT 04/21/18    12:23  PM    [facility] [address] [place], [place], [address] Phone: [phone]   Fax:  [phone]  Name: Herbert Davis MRN: [id number] Date of Birth: 02/08/1981

## 2018-04-21 NOTE — Therapy (Signed)
[facility] [address] [place], [place], [address] Phone: [phone]   Fax:  [phone]  Occupational Therapy Treatment  Patient Details  Name: Herbert Davis MRN: [id number] Date of Birth: 18-Sep-1981 Referring Provider: Dr. Anne Hahn   Encounter Date: 04/21/2018  OT End of Session - 04/21/18 1103    Visit Number  7    Number of Visits  13    Date for OT Re-Evaluation  05/25/18    Authorization Type  BCBS    Authorization Time Period  12 weeks, no more than 4 modailities per insurance    Authorization - Visit Number  6    Authorization - Number of Visits  15    OT Start Time  1102    OT Stop Time  1145    OT Time Calculation (min)  43 min    Activity Tolerance  Patient tolerated treatment well    Behavior During Therapy  [facility] for tasks assessed/performed       No past medical history on file.  No past surgical history on file.  There were no vitals filed for this visit.  Subjective Assessment - 04/21/18 1102    Subjective   little tingling in L little finger, Pt reports that she is not carrying a heavy bag in LUE, shaving    Patient Stated Goals  get movement back in left arm    Currently in Pain?  No/denies       Reviewed red theraband HEP.--pt returned demo each x10  Ulnar nerve glides x10  Picking up blocks level 2 with min difficulty for incr sustained grip strength.  Completing Purdue Pegboard with min difficulty for incr coordination.  Placing small pegs in pegboard with each finger/thumb for improved coordination.  Placing/removing clothespins with 1-8lb resistance for incr strength and functional reaching LUE.  Rotating 2 golf balls in left hand with min-mod difficulty for incr coordination and functional use of 4-5th digits  Pulling pegs from red putty for incr stength        OT Short Term Goals - 03/31/18 1023      OT SHORT TERM GOAL #1   Title  I with HEP. due 04/10/18    Status   Achieved      OT SHORT TERM GOAL #2   Title  Pt will demostrate ability to retrieve a lightweight object at 120 shoulder flexion with LUE.    Status  Achieved      OT SHORT TERM GOAL #3   Title  Pt will verbalize understanding of LUE positioning to minimize pain and risk for injury.    Status  Achieved      OT SHORT TERM GOAL #4   Title  Pt will verbalize understanding of precautions related to sensory impairment    Status  Achieved      OT SHORT TERM GOAL #5   Title  Pt will demonstrate improved functional use of LUE as evidenced by decreasing 3 button/ unbutton to 35 secs or less    Status  Achieved 28.19 secs        OT Long Term Goals - 04/07/18 1317      OT LONG TERM GOAL #1   Title  I with updated HEP. due 05/25/18    Time  12    Period  Weeks    Status  On-going red theraband issued check on perfromance, green putty for composite grip      OT LONG TERM GOAL #2  Title  Pt will resume use of LUE as dominant hand at least 70% of the time for ADLS/IADLs.    Time  12    Period  Weeks    Status  On-going 50-60%      OT LONG TERM GOAL #3   Title  Pt will demonstrate improved fine motor coordination as evidenced by decreasing LUE 9 hole peg test score to 25 secs or less.     Time  12    Period  Weeks    Status  On-going 26.22 secs      OT LONG TERM GOAL #4   Title  Pt will demonstrate ability to perform full composite finger flexion for increased LUE functional use.    Time  12    Period  Weeks    Status  Achieved            Plan - 04/21/18 1103    Clinical Impression Statement  Pt is progressing towards goals. He denies pain and demonstrates continued improvements in functional use    Occupational Profile and client history currently impacting functional performance  Pt has no significant PMH. 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 Pt ambulates with left arm swing in an uncontrolled  fashion even though he has the ability to flex shoulder to 110*.    Occupational performance deficits (Please refer to evaluation for details):  ADL's;IADL's;Work;Play;Rest and Sleep;Social Participation    Rehab Potential  Fair    Current Impairments/barriers affecting progress:  pain and weakness of unknown etiology(insurance will not pay for MRI of neck), amplified symptoms    OT Frequency  1x / week    OT Duration  12 weeks    OT Treatment/Interventions  Self-care/ADL training;Electrical Stimulation;Therapeutic exercise;Gait Training;Visual/perceptual remediation/compensation;Coping strategies training;Patient/family education;Splinting;Neuromuscular education;Paraffin;Moist Heat;Fluidtherapy;Energy conservation;Building services engineerunctional Mobility Training;Therapeutic activities;Cognitive remediation/compensation;Passive range of motion;Manual Therapy;DME and/or AE instruction;Ultrasound;Contrast Bath;Cryotherapy;Aquatic Therapy    Plan  continue to address LUE functional use and pain reduction    OT Home Exercise Plan  ulnar n. gloiding,putty(yellow, red, greeen), red theraband, shoulder flexion with 2lbs weight    Consulted and Agree with Plan of Care  Patient       Patient will benefit from skilled therapeutic intervention in order to improve the following deficits and impairments:  Decreased knowledge of use of DME, Pain, Impaired flexibility, Abnormal gait, Decreased coordination, Decreased mobility, Impaired sensation, Decreased coping skills, Decreased strength, Decreased range of motion, Decreased endurance, Decreased activity tolerance, Decreased balance, Decreased knowledge of precautions, Decreased safety awareness, Difficulty walking, Impaired perceived functional ability, Impaired UE functional use  Visit Diagnosis: Muscle weakness (generalized)  Other lack of coordination  Pain in left arm    Problem List There are no active problems to display for this  patient.   [facility] CenterFREEMAN,Walker Sitar 04/21/2018, 11:16 AM  [facility] [address] [place], [place], [address] Phone: [phone]   Fax:  [phone]  Name: Alanda SlimBryan M Carpenter MRN: [id number] Date of Birth: 09/05/1981   Willa FraterAngela Rosmary Dionisio, OTR/L [facility] [address] [place], [place]  [address] [phone] phone [phone] 04/21/18 11:16 AM

## 2018-04-28 ENCOUNTER — Encounter: Payer: Self-pay | Admitting: Rehabilitative and Restorative Service Providers"

## 2018-04-28 ENCOUNTER — Ambulatory Visit: Payer: BLUE CROSS/BLUE SHIELD | Admitting: Occupational Therapy

## 2018-04-28 ENCOUNTER — Ambulatory Visit: Payer: BLUE CROSS/BLUE SHIELD | Admitting: Rehabilitation

## 2018-04-28 DIAGNOSIS — M6281 Muscle weakness (generalized): Secondary | ICD-10-CM

## 2018-04-28 DIAGNOSIS — R208 Other disturbances of skin sensation: Secondary | ICD-10-CM

## 2018-04-28 DIAGNOSIS — R42 Dizziness and giddiness: Secondary | ICD-10-CM | POA: Diagnosis not present

## 2018-04-28 DIAGNOSIS — R262 Difficulty in walking, not elsewhere classified: Secondary | ICD-10-CM | POA: Diagnosis not present

## 2018-04-28 DIAGNOSIS — M542 Cervicalgia: Secondary | ICD-10-CM | POA: Diagnosis not present

## 2018-04-28 DIAGNOSIS — R2681 Unsteadiness on feet: Secondary | ICD-10-CM | POA: Diagnosis not present

## 2018-04-28 DIAGNOSIS — R278 Other lack of coordination: Secondary | ICD-10-CM | POA: Diagnosis not present

## 2018-04-28 DIAGNOSIS — R29898 Other symptoms and signs involving the musculoskeletal system: Secondary | ICD-10-CM | POA: Diagnosis not present

## 2018-04-28 DIAGNOSIS — M79602 Pain in left arm: Secondary | ICD-10-CM | POA: Diagnosis not present

## 2018-04-28 NOTE — Therapy (Addendum)
[facility] [address] [place], [place], [address] Phone: [phone]   Fax:  [phone]  Physical Therapy Treatment  Patient Details  Name: Herbert SlimBryan M Quintanar MRN: [id number] Date of Birth: [birth year] Referring Provider: Dr. Anne HahnWillis   Encounter Date: 04/28/2018  PT End of Session - 04/28/18 1212    Visit Number  18    Number of Visits  21    Date for PT Re-Evaluation  05/21/18    Authorization Type  BCBS: $20 copay; VL combined with PT, OT, Chiro, in home.  30 VL    Authorization - Visit Number  18    Authorization - Number of Visits  30    PT Start Time  1104    PT Stop Time  1145    PT Time Calculation (min)  41 min    Activity Tolerance  Patient tolerated treatment well    Behavior During Therapy  [facility] for tasks assessed/performed       History reviewed. No pertinent past medical history.  History reviewed. No pertinent surgical history.  There were no vitals filed for this visit.  Subjective Assessment - 04/28/18 1107    Subjective  Pt reports he is still driving on less traffic streets.      Pertinent History  no PMH    Limitations  Walking    Diagnostic tests  Per neurology notes: The patient has had a normal MRI of the brain, normal audiometric testing, normal ENG evaluation    Patient Stated Goals  Get rid of the dizziness; can deal with neck pain and tingling    Currently in Pain?  Yes    Pain Score  1     Pain Location  Finger (Comment which one)    Pain Orientation  Left    Pain Descriptors / Indicators  Tingling    Pain Type  Chronic pain    Pain Onset  More than a month ago    Pain Frequency  Intermittent    Aggravating Factors   stretches, exercises in OT    Pain Relieving Factors  rest                        [facility] Adult PT Treatment/Exercise - 04/28/18 1129      Ambulation/Gait   Ambulation/Gait  Yes    Ambulation/Gait Assistance  5: Supervision    Ambulation/Gait  Assistance Details  Began session with gait in clinic with cues for improved arm swing, however note improved gait speed and fluidity of gait.     Ambulation Distance (Feet)  400 Feet    Assistive device  None    Gait Pattern  Step-through pattern;Decreased arm swing - right;Decreased arm swing - left;Decreased step length - right;Decreased step length - left;Decreased stride length    Ambulation Surface  Level;Indoor    Gait Comments  Also during gait, had pt change speeds, make sudden stops, ambulate backwards then make 90 deg turns to continue gait.  Pt tolerated well without overt LOB.       Neuro Re-ed    Neuro Re-ed Details   x 1 viewing horizontal and vertical standing on pillow with feet apart x 20 secs each>feet together on pillow x 20 secs each with intermittent UE support, walking with ball toss vertically following with head/eyes x 30' x 25' x 15' before needing to stop and rest due to dizziness.  Progressed to sitting moving 1.1lb weighted ball from floor  to outright (arms extended in front of him) x 5 reps (2 sets) before needing to rest, progressed to standing moving ball from midline (holding ball at eye level) to floor and back x 5 reps.  Pt with moderate LOB posteriorly but was able to correct with cues for weight shift.  Moving ball from R foot to computer tray table to the L (at waist level) x 2 reps before needing to rest from increased dizziness.  Modified activity to having pt be in seated position moving from R foot to table and back x 5 reps x 2 sets down R to upward L and then x 5 reps, x 10 reps from down L to upward R.  Provided cues for more diagonal movement as he wanted to compensate with upward then lateral instead of combining them.  Pt continues to have more limitation in upward and L lateral movements, but overall is demonstrating improvement.             PT Long Term Goals - 04/28/18 1227      PT LONG TERM GOAL #1   Title  Pt will demonstrate independence with  neck ROM, vestibular, standing balance HEP    Time  12    Period  Weeks    Status  Revised      PT LONG TERM GOAL #2   Title  Pt will report 75% improvement in dizziness with head movements, turning and with gait and will tolerate gaze adaptation exercises in standing (x1 viewing).    Time  12    Period  Weeks    Status  Revised      PT LONG TERM GOAL #3   Title  Pt will improve gait velocity to >/=2.8 ft/sec without AD for improved safety with gait in community    Time  12    Period  Weeks    Status  Revised      PT LONG TERM GOAL #4   Title  Pt will improve DGI to >/= 18/24    Time  12    Period  Weeks    Status  Revised      PT LONG TERM GOAL #6   Title  Pt will ambulate x 250' over outdoor paved surfaces without cane at MOD I level; will report return to driving short distances along well known routes.    Time  12    Period  Weeks    Status  Revised              Plan - 04/28/18 1109    Clinical Impression Statement  Pt continues to be limited by upward and left lateral head/eye motions, therefore began to incorporate those more into session along with more dynamic gait and x 1 viewing tasks on compliant surfaces.  Pt progressing towards LTGs.     Rehab Potential  Good    PT Frequency  1x / week    PT Duration  12 weeks    PT Treatment/Interventions  ADLs/Self Care Home Management;Canalith Repostioning;Cryotherapy;Moist Heat;Traction;DME Instruction;Gait training;Stair training;Functional mobility training;Therapeutic exercise;Therapeutic activities;Balance training;Neuromuscular re-education;Patient/family education;Manual techniques;Passive range of motion;Dry needling;Taping;Vestibular    PT Next Visit Plan  progress x 1 viewing with busy background and x 2 viewing in sitting; incorporate basketball activities (pt used to play pick up basketball); gait indoor and outdoor with community challenges: changing gait speed fast <> slow, sudden stops, external  perturbations, walking in narrow path, descending stairs with reciprocal pattern    Consulted  and Agree with Plan of Care  Patient       Patient will benefit from skilled therapeutic intervention in order to improve the following deficits and impairments:  Abnormal gait, Decreased activity tolerance, Decreased balance, Decreased endurance, Decreased mobility, Decreased range of motion, Decreased strength, Difficulty walking, Dizziness, Impaired sensation, Postural dysfunction, Pain  Visit Diagnosis: Muscle weakness (generalized)  Dizziness and giddiness  Unsteadiness on feet     Problem List There are no active problems to display for this patient.   Harriet Butte, PT, MPT [facility] [address] [place], [place], [address] Phone: [phone]   Fax:  [phone] 04/28/18, 12:14 PM  Name: WELFORD CHRISTMAS MRN: [id number] Date of Birth: 12-17-1980

## 2018-04-28 NOTE — Patient Instructions (Signed)
  Coordination Activities  Perform the following activities for 5 minutes 1-2 times per day with left hand(s).   Rotate ball in fingertips (clockwise and counter-clockwise).  Pick up coins one at a time until you get 5 in your hand, then move coins from palm to fingertips to stack one at a time.  MP Flexion (Resistive Putty)    Bending only at large knuckles, press putty down against thumb. Keep fingertips straight. Repeat _10___ times. Do __1-2__ sessions per day.

## 2018-04-28 NOTE — Therapy (Signed)
[facility] [address] [place], [place], [address] Phone: [phone]   Fax:  [phone]  Occupational Therapy Treatment  Patient Details  Name: NYLE LIMB MRN: [id number] Date of Birth: 1981-03-26 Referring Provider: Dr. Anne Hahn   Encounter Date: 04/28/2018  OT End of Session - 04/28/18 1045    Visit Number  8    Number of Visits  13    Date for OT Re-Evaluation  05/25/18    Authorization Type  BCBS    Authorization Time Period  12 weeks, no more than 4 modailities per insurance    Authorization - Visit Number  7    Authorization - Number of Visits  15    OT Start Time  1020    OT Stop Time  1058    OT Time Calculation (min)  38 min    Activity Tolerance  Patient tolerated treatment well    Behavior During Therapy  [facility] for tasks assessed/performed       No past medical history on file.  No past surgical history on file.  There were no vitals filed for this visit.  Subjective Assessment - 04/28/18 1022    Subjective   It's getting better    Pertinent History  neck pain, dizziness    Patient Stated Goals  get movement back in left arm    Currently in Pain?  No/denies       TREATMENT:  UBE x 8 min. (4 min forward, 4 min backward). Ulnar n. Gliding ex's x 5 reps Issued coordination ex's to incorporate ulnar side and intrinsic movement - see pt instructions for details. Pt demo each. Also added putty ex for intrinsic (+) strengthening.  Pen rolling ex x 5 reps. Flipping large cards Lt and to encourage intrinsic (+) movement (MP's flexed, IP's extended), progressed to using thumb and last 2 digits only to flip cards. Pt then pushing small cards off top of deck for IP flexion and coordination of last 2 digits w/ other fingers.  Holding and pulling washcloth w/ MP flexion, then wringing out washcloth                               Plan - 04/28/18 1048    Clinical Impression  Statement  Pt continues to progress LUE with less overall numbness ulnar n. distribution and more motoric function with finger adduction    Occupational Profile and client history currently impacting functional performance  Pt has no significant PMH. 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 Pt ambulates with left arm swing in an uncontrolled fashion even though he has the ability to flex shoulder to 110*.    Occupational performance deficits (Please refer to evaluation for details):  ADL's;IADL's;Work;Play;Rest and Sleep;Social Participation    Rehab Potential  Fair    Current Impairments/barriers affecting progress:  pain and weakness of unknown etiology(insurance will not pay for MRI of neck), amplified symptoms    OT Frequency  1x / week    OT Duration  12 weeks    OT Treatment/Interventions  Self-care/ADL training;Electrical Stimulation;Therapeutic exercise;Gait Training;Visual/perceptual remediation/compensation;Coping strategies training;Patient/family education;Splinting;Neuromuscular education;Paraffin;Moist Heat;Fluidtherapy;Energy conservation;Building services engineer;Therapeutic activities;Cognitive remediation/compensation;Passive range of motion;Manual Therapy;DME and/or AE instruction;Ultrasound;Contrast Bath;Cryotherapy;Aquatic Therapy    Plan  continue to address LUE functional use and pain reduction    OT Home Exercise Plan  ulnar  n. gloiding,putty(yellow, red, greeen), red theraband, shoulder flexion with 2lbs weight, coordination and putty HEP    Consulted and Agree with Plan of Care  Patient       Patient will benefit from skilled therapeutic intervention in order to improve the following deficits and impairments:  Decreased knowledge of use of DME, Pain, Impaired flexibility, Abnormal gait, Decreased coordination, Decreased mobility, Impaired sensation, Decreased coping skills, Decreased strength,  Decreased range of motion, Decreased endurance, Decreased activity tolerance, Decreased balance, Decreased knowledge of precautions, Decreased safety awareness, Difficulty walking, Impaired perceived functional ability, Impaired UE functional use  Visit Diagnosis: Muscle weakness (generalized)  Other disturbances of skin sensation  Other lack of coordination    Problem List There are no active problems to display for this patient.   Kelli ChurnBallie, Greely Atiyeh Johnson, OTR/L 04/28/2018, 10:50 AM  [facility] [address] [place], [place], [address] Phone: [phone]   Fax:  [phone]  Name: Alanda SlimBryan M Kurkowski MRN: [id number] Date of Birth: 1981-07-25

## 2018-05-05 ENCOUNTER — Encounter: Payer: Self-pay | Admitting: Occupational Therapy

## 2018-05-05 ENCOUNTER — Encounter: Payer: Self-pay | Admitting: Physical Therapy

## 2018-05-05 ENCOUNTER — Ambulatory Visit: Payer: BLUE CROSS/BLUE SHIELD | Admitting: Occupational Therapy

## 2018-05-05 ENCOUNTER — Ambulatory Visit: Payer: BLUE CROSS/BLUE SHIELD | Admitting: Physical Therapy

## 2018-05-05 DIAGNOSIS — M6281 Muscle weakness (generalized): Secondary | ICD-10-CM

## 2018-05-05 DIAGNOSIS — R29898 Other symptoms and signs involving the musculoskeletal system: Secondary | ICD-10-CM | POA: Diagnosis not present

## 2018-05-05 DIAGNOSIS — R42 Dizziness and giddiness: Secondary | ICD-10-CM | POA: Diagnosis not present

## 2018-05-05 DIAGNOSIS — R278 Other lack of coordination: Secondary | ICD-10-CM | POA: Diagnosis not present

## 2018-05-05 DIAGNOSIS — R208 Other disturbances of skin sensation: Secondary | ICD-10-CM

## 2018-05-05 DIAGNOSIS — R262 Difficulty in walking, not elsewhere classified: Secondary | ICD-10-CM

## 2018-05-05 DIAGNOSIS — R2681 Unsteadiness on feet: Secondary | ICD-10-CM | POA: Diagnosis not present

## 2018-05-05 DIAGNOSIS — M79602 Pain in left arm: Secondary | ICD-10-CM | POA: Diagnosis not present

## 2018-05-05 DIAGNOSIS — M542 Cervicalgia: Secondary | ICD-10-CM | POA: Diagnosis not present

## 2018-05-05 NOTE — Therapy (Signed)
[facility] [address] [place], [place], [address] Phone: [phone]   Fax:  [phone]  Physical Therapy Treatment  Patient Details  Name: Herbert Davis MRN: [id number] Date of Birth: [birth year] Referring Provider: Dr. Anne HahnWillis   Encounter Date: 05/05/2018  PT End of Session - 05/05/18 1120    Visit Number  19    Number of Visits  21   per recertification   Date for PT Re-Evaluation  05/21/18    Authorization Type  BCBS: $20 copay; VL combined with PT, OT, Chiro, in home.  30 VL    Authorization - Visit Number  19   updated due to entry error   Authorization - Number of Visits  30    PT Start Time  1023    PT Stop Time  1103    PT Time Calculation (min)  40 min    Activity Tolerance  Patient tolerated treatment well    Behavior During Therapy  WFL for tasks assessed/performed       History reviewed. No pertinent past medical history.  History reviewed. No pertinent surgical history.  There were no vitals filed for this visit.  Subjective Assessment - 05/05/18 1027    Subjective  Has been driving for 30 minutes over the past 3 days and has given up using the cane.  Pt has a goal of driving here supervised for the last session.    Pertinent History  no PMH    Limitations  Walking    Diagnostic tests  Per neurology notes: The patient has had a normal MRI of the brain, normal audiometric testing, normal ENG evaluation    Patient Stated Goals  Get rid of the dizziness; can deal with neck pain and tingling    Currently in Pain?  No/denies    Pain Onset  More than a month ago                        Vestibular Treatment/Exercise - 05/05/18 1043      Vestibular Treatment/Exercise   Vestibular Treatment Provided  Gaze    Habituation Exercises  Standing Diagonal Head Turns    Gaze Exercises  X1 Viewing Horizontal;X1 Viewing Vertical;X2 Viewing Horizontal;X2 Viewing Vertical      Seated  Diagonal Head Turns   Number of Reps  8    Symptoms Description   4 sets x 8 reps standing with feet apart in balance master for busy background reaching diagonally down R > up L, down L > up R moving post it notes.  Performed again on solid and then compliant surface but alternating between diagonal directions      X1 Viewing Horizontal   Foot Position  feet close together and then feet staggered R/L forwards; in balance master for busy background    Reps  3    Comments  30 - 60 seconds      X1 Viewing Vertical   Foot Position  feet close together and then feet staggered R/L forwards; in balance master for busy background    Reps  3    Comments  30 - 60 seconds      X2 Viewing Horizontal   Foot Position  seated without back support    Reps  5     Comments  x 2 sets with verbal cues for technique; progressed to one set of 8 reps      X2 Viewing Vertical   Foot  Position  seated without back support    Reps  5    Comments  x 2 sets with verbal cues for technique; progressed to one set of 8 reps            PT Education - 05/05/18 1119    Education provided  Yes    Education Details  continued to encourage diagonal head movements into daily activities/ADL (putting away groceries/dishes/laundry); progressed x 2 viewing to 8 repetitions in unsupported sitting    Person(s) Educated  Patient    Methods  Explanation;Demonstration    Comprehension  Verbalized understanding;Returned demonstration       PT Short Term Goals - 04/14/18 1115      PT SHORT TERM GOAL #1   Title  Pt will demonstrate independence with updated balance, vestibular, cervical and driving HEP    Time  6    Period  Weeks    Status  Achieved      PT SHORT TERM GOAL #2   Title  Pt will improve gait velocity with cane to >/= 2.6 ft/sec    Baseline  2.8 ft/sec without the cane    Time  6    Period  Weeks    Status  Achieved      PT SHORT TERM GOAL #3   Title  Pt will improve DGI by 4 points to decrease  risk for falls ambulating in the community    Baseline  16/24    Time  6    Period  Weeks    Status  Achieved      PT SHORT TERM GOAL #4   Title  Will report 50% improvement in dizziness with ambulation and head turns    Baseline  60% improvement    Time  6    Period  Weeks    Status  Achieved      PT SHORT TERM GOAL #5   Title  Pt will ambulate 230' indoors without cane with supervision and negotiate 8 stairs with one rail, alternating sequence with supervision and improved control when descending    Baseline  >230' without cane indoors, 8 stairs with one rail, alternating sequence with supervision    Time  6    Period  Weeks    Status  Achieved        PT Long Term Goals - 04/28/18 1227      PT LONG TERM GOAL #1   Title  Pt will demonstrate independence with neck ROM, vestibular, standing balance HEP    Time  12    Period  Weeks    Status  Revised      PT LONG TERM GOAL #2   Title  Pt will report 75% improvement in dizziness with head movements, turning and with gait and will tolerate gaze adaptation exercises in standing (x1 viewing).    Time  12    Period  Weeks    Status  Revised      PT LONG TERM GOAL #3   Title  Pt will improve gait velocity to >/=2.8 ft/sec without AD for improved safety with gait in community    Time  12    Period  Weeks    Status  Revised      PT LONG TERM GOAL #4   Title  Pt will improve DGI to >/= 18/24    Time  12    Period  Weeks    Status  Revised  PT LONG TERM GOAL #6   Title  Pt will ambulate x 250' over outdoor paved surfaces without cane at MOD I level; will report return to driving short distances along well known routes.    Time  12    Period  Weeks    Status  Revised            Plan - 05/05/18 1120    Clinical Impression Statement  Pt continues to make excellent progress and demonstrates more natural gait with more normal BOS, increased velocity and arm swing.  Pt is also progressing with driving outside therapy  but continues to report increased difficulty tolerating rapid and multiple head turns.  Treatment session today continued to focus gaze adaptation on incorporation of busy background in front of and to the sides of pt (in balance master), habituation to diagonal head movements in busy background on solid and compliant surfaces and progression of x2 viewing.  Will begin re-assessment of LTG next session and will likely be ready to D/C with ongoing HEP after two more visits.    Rehab Potential  Good    PT Frequency  1x / week    PT Duration  12 weeks    PT Treatment/Interventions  ADLs/Self Care Home Management;Canalith Repostioning;Cryotherapy;Moist Heat;Traction;DME Instruction;Gait training;Stair training;Functional mobility training;Therapeutic exercise;Therapeutic activities;Balance training;Neuromuscular re-education;Patient/family education;Manual techniques;Passive range of motion;Dry needling;Taping;Vestibular    PT Next Visit Plan  CHECK LTG; finalize HEP.  D/C to conserve visitsprogress x 1 viewing with busy background and x 2 viewing in sitting; incorporate basketball activities (pt used to play pick up basketball); gait indoor and outdoor with community challenges: changing gait speed fast <> slow, sudden stops, external perturbations, walking in narrow path, descending stairs with reciprocal pattern    Consulted and Agree with Plan of Care  Patient       Patient will benefit from skilled therapeutic intervention in order to improve the following deficits and impairments:  Abnormal gait, Decreased activity tolerance, Decreased balance, Decreased endurance, Decreased mobility, Decreased range of motion, Decreased strength, Difficulty walking, Dizziness, Impaired sensation, Postural dysfunction, Pain  Visit Diagnosis: Muscle weakness (generalized)  Other disturbances of skin sensation  Dizziness and giddiness  Cervicalgia  Unsteadiness on feet  Difficulty in walking, not elsewhere  classified     Problem List There are no active problems to display for this patient.   Dierdre HighmanAudra F Vercie Pokorny, PT, DPT 05/05/18    11:32 AM    [facility] [address] [place], [place], [address] Phone: [phone]   Fax:  [phone]  Name: Herbert Davis MRN: [id number] Date of Birth: 1981/07/30

## 2018-05-05 NOTE — Therapy (Signed)
[facility] [address] [place], [place], [address] Phone: [phone]   Fax:  [phone]  Occupational Therapy Treatment  Patient Details  Name: Herbert Davis MRN: [id number] Date of Birth: [birth year] Referring Provider: Dr. Jannifer Franklin   Encounter Date: 05/05/2018  OT End of Session - 05/05/18 1111    Visit Number  9    Number of Visits  13    Date for OT Re-Evaluation  05/25/18    Authorization Type  BCBS    Authorization Time Period  12 weeks, no more than 4 modailities per insurance    Authorization - Visit Number  9    Authorization - Number of Visits  15    OT Start Time  1104    OT Stop Time  1145    OT Time Calculation (min)  41 min    Activity Tolerance  Patient tolerated treatment well    Behavior During Therapy  [facility] for tasks assessed/performed       History reviewed. No pertinent past medical history.  History reviewed. No pertinent surgical history.  There were no vitals filed for this visit.  Subjective Assessment - 05/05/18 1105    Subjective   dizzy from PT, but not bad.  Pt reports improvement with typing, difficulty with hoding something heavy.    Pertinent History  neck pain, dizziness    Patient Stated Goals  get movement back in left arm    Currently in Pain?  Yes    Pain Score  1     Pain Location  Hand    Pain Orientation  Left    Pain Descriptors / Indicators  Tingling    Aggravating Factors   use hand a lot    Pain Relieving Factors  nerve gliding         Ulnar nerve glides x5 at beginning of session.  Rotating 2 golf balls in L hand for incr coordination and use of ulnar side of hand.  Reviewed additional HEP issued last session.  Pt returned demo each.  Removing pegs from green putty with focus on 4-5th digits for incr strength.  Functional reaching to place/remove clothespins with 1-8lb resistance on vertical pole for incr strength/activity tolerance.  Placing small  pegs in pegboard with 4-5th digits with min difficulty.  Unlar nerve glides x5 at end of session.  Arm bike x6 min level 3 for reciprocal conditioning without rest (forwards/backwards).  Sliding cards across table using MP flex/ext.          OT Short Term Goals - 03/31/18 1023      OT SHORT TERM GOAL #1   Title  I with HEP. due 04/10/18    Status  Achieved      OT SHORT TERM GOAL #2   Title  Pt will demostrate ability to retrieve a lightweight object at 120 shoulder flexion with LUE.    Status  Achieved      OT SHORT TERM GOAL #3   Title  Pt will verbalize understanding of LUE positioning to minimize pain and risk for injury.    Status  Achieved      OT SHORT TERM GOAL #4   Title  Pt will verbalize understanding of precautions related to sensory impairment    Status  Achieved      OT SHORT TERM GOAL #5   Title  Pt will demonstrate improved functional use of LUE as evidenced by decreasing 3 button/ unbutton to 35 secs or  less    Status  Achieved   28.19 secs       OT Long Term Goals - 05/05/18 1117      OT LONG TERM GOAL #1   Title  I with updated HEP. due 05/25/18    Time  12    Period  Weeks    Status  Achieved   red theraband issued check on perfromance, green putty for composite grip.  05/05/18  met     OT LONG TERM GOAL #2   Title  Pt will resume use of LUE as dominant hand at least 70% of the time for ADLS/IADLs.    Time  12    Period  Weeks    Status  Achieved   50-60%.   05/05/18:   approx 75% of the time     OT LONG TERM GOAL #3   Title  Pt will demonstrate improved fine motor coordination as evidenced by decreasing LUE 9 hole peg test score to 25 secs or less.     Time  12    Period  Weeks    Status  On-going   26.22 secs     OT LONG TERM GOAL #4   Title  Pt will demonstrate ability to perform full composite finger flexion for increased LUE functional use.    Time  12    Period  Weeks    Status  Achieved            Plan - 05/05/18 1112     Clinical Impression Statement  Pt continues to progress with LUE strength and coordination.      Occupational Profile and client history currently impacting functional performance  Pt has no significant PMH. 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 Pt ambulates with left arm swing in an uncontrolled fashion even though he has the ability to flex shoulder to 110*.    Occupational performance deficits (Please refer to evaluation for details):  ADL's;IADL's;Work;Play;Rest and Sleep;Social Participation    Rehab Potential  Fair    Current Impairments/barriers affecting progress:  pain and weakness of unknown etiology(insurance will not pay for MRI of neck), amplified symptoms    OT Frequency  1x / week    OT Duration  12 weeks    OT Treatment/Interventions  Self-care/ADL training;Electrical Stimulation;Therapeutic exercise;Gait Training;Visual/perceptual remediation/compensation;Coping strategies training;Patient/family education;Splinting;Neuromuscular education;Paraffin;Moist Heat;Fluidtherapy;Energy conservation;Therapist, nutritional;Therapeutic activities;Cognitive remediation/compensation;Passive range of motion;Manual Therapy;DME and/or AE instruction;Ultrasound;Contrast Bath;Cryotherapy;Aquatic Therapy    Plan  check remaining goal and anticipate d/c     OT Home Exercise Plan  ulnar n. gloiding,putty(yellow, red, greeen), red theraband, shoulder flexion with 2lbs weight, coordination and putty HEP    Consulted and Agree with Plan of Care  Patient       Patient will benefit from skilled therapeutic intervention in order to improve the following deficits and impairments:  Decreased knowledge of use of DME, Pain, Impaired flexibility, Abnormal gait, Decreased coordination, Decreased mobility, Impaired sensation, Decreased coping skills, Decreased strength, Decreased range of motion, Decreased endurance, Decreased activity  tolerance, Decreased balance, Decreased knowledge of precautions, Decreased safety awareness, Difficulty walking, Impaired perceived functional ability, Impaired UE functional use  Visit Diagnosis: Muscle weakness (generalized)  Other disturbances of skin sensation  Other lack of coordination  Left arm weakness    Problem List There are no active problems to display for this patient.   [facility] 05/05/2018, 11:23 AM  [facility] [address] [place], [place], [address] Phone: [phone]   Fax:  [phone]  Name: SHELLY SHOULTZ MRN: [id number] Date of Birth: [birth year]   Vianne Bulls, OTR/L [facility] [address] [place], [place]  [address] [phone] phone [phone] 05/05/18 11:25 AM

## 2018-05-06 ENCOUNTER — Encounter: Payer: Self-pay | Admitting: Neurology

## 2018-05-06 ENCOUNTER — Ambulatory Visit: Payer: BLUE CROSS/BLUE SHIELD | Admitting: Neurology

## 2018-05-06 VITALS — BP 145/100 | HR 98 | Ht 71.0 in | Wt 228.0 lb

## 2018-05-06 DIAGNOSIS — R42 Dizziness and giddiness: Secondary | ICD-10-CM

## 2018-05-06 NOTE — Progress Notes (Signed)
    Reason for visit: Dizziness, neck pain  Herbert Davis is an 37 y.o. male  History of present illness:  Mr. Herbert Davis is a 37 year old left-handed white male with a history of a work-related event in October 2018.  The patient had a sudden loud noise in his ears from a headphone, shortly after this he began complaining of dizziness and gait instability, he has developed neck pain.  ENT evaluation did not show any abnormalities, MRI of the brain was normal.  The patient was seen through our office in February 2019, given the history of neck pain a MRI of the cervical spine was ordered, this was never done.  The patient has been in physical therapy, this has offered good benefit, he no longer has neck pain.  He only has dizziness now when he looks to the left and up.  He has been careful about driving because of this.  He is still not working, he is looking for a job.  The patient returns for an evaluation.  History reviewed. No pertinent past medical history.  Past Surgical History:  Procedure Laterality Date  . WISDOM TOOTH EXTRACTION      Family History  Problem Relation Age of Onset  . Healthy Mother   . Healthy Father   . Healthy Sister   . Healthy Brother   . Healthy Brother     Social history:  reports that he has never smoked. He has never used smokeless tobacco. He reports that he does not drink alcohol or use drugs.   No Known Allergies  Medications:  Prior to Admission medications   Not on File    ROS:  Out of a complete 14 system review of symptoms, the patient complains only of the following symptoms, and all other reviewed systems are negative.  Dizziness  Blood pressure (!) 145/100, pulse 98, height 5\' 11"  (1.803 m), weight 228 lb (103.4 kg).  Physical Exam  General: The patient is alert and cooperative at the time of the examination.  Neuromuscular: Range move the cervical spine is full.  Skin: No significant peripheral edema is noted.   Neurologic  Exam  Mental status: The patient is alert and oriented x 3 at the time of the examination. The patient has apparent normal recent and remote memory, with an apparently normal attention span and concentration ability.   Cranial nerves: Facial symmetry is present. Speech is normal, no aphasia or dysarthria is noted. Extraocular movements are full. Visual fields are full.  Motor: The patient has good strength in all 4 extremities.  Sensory examination: Soft touch sensation is symmetric on the face, arms, and legs.  Coordination: The patient has good finger-nose-finger and heel-to-shin bilaterally.  Gait and station: The patient has a slightly wide-based gait.  Tandem gait is normal. Romberg is negative. No drift is seen.  Reflexes: Deep tendon reflexes are symmetric.   Assessment/Plan:  1.  Chronic dizziness  The patient has improved with his neck discomfort, he still has some slight dizziness with turning his head to the left, overall he has improved significantly, he is to continue the exercises given to him through physical therapy.  The patient will follow-up through this office if needed.  Marlan Palau. Keith Veneta Sliter MD 05/06/2018 10:56 AM  [facility] [address] [place], [place] [address]  Phone [phone] Fax [phone]

## 2018-05-12 ENCOUNTER — Encounter: Payer: Self-pay | Admitting: Occupational Therapy

## 2018-05-12 ENCOUNTER — Encounter: Payer: Self-pay | Admitting: Physical Therapy

## 2018-05-12 ENCOUNTER — Ambulatory Visit: Payer: BLUE CROSS/BLUE SHIELD | Admitting: Physical Therapy

## 2018-05-12 DIAGNOSIS — M6281 Muscle weakness (generalized): Secondary | ICD-10-CM

## 2018-05-12 DIAGNOSIS — M542 Cervicalgia: Secondary | ICD-10-CM

## 2018-05-12 DIAGNOSIS — R262 Difficulty in walking, not elsewhere classified: Secondary | ICD-10-CM | POA: Diagnosis not present

## 2018-05-12 DIAGNOSIS — M79602 Pain in left arm: Secondary | ICD-10-CM | POA: Diagnosis not present

## 2018-05-12 DIAGNOSIS — R2681 Unsteadiness on feet: Secondary | ICD-10-CM

## 2018-05-12 DIAGNOSIS — R29898 Other symptoms and signs involving the musculoskeletal system: Secondary | ICD-10-CM | POA: Diagnosis not present

## 2018-05-12 DIAGNOSIS — R208 Other disturbances of skin sensation: Secondary | ICD-10-CM | POA: Diagnosis not present

## 2018-05-12 DIAGNOSIS — R42 Dizziness and giddiness: Secondary | ICD-10-CM

## 2018-05-12 DIAGNOSIS — R278 Other lack of coordination: Secondary | ICD-10-CM | POA: Diagnosis not present

## 2018-05-12 NOTE — Therapy (Signed)
[facility] [address] [place], [place], [address] Phone: [phone]   Fax:  [phone]  Patient Details  Name: Herbert Davis MRN: [id number] Date of Birth: June 05, 1981 Referring Provider:  No ref. provider found  Encounter Date: 05/12/2018  OCCUPATIONAL THERAPY DISCHARGE SUMMARY  Visits from Start of Care: 9  Current functional level related to goals / functional outcomes: OT Short Term Goals - 03/31/18 1023      OT SHORT TERM GOAL #1   Title  I with HEP. due 04/10/18    Status  Achieved      OT SHORT TERM GOAL #2   Title  Pt will demostrate ability to retrieve a lightweight object at 120 shoulder flexion with LUE.    Status  Achieved      OT SHORT TERM GOAL #3   Title  Pt will verbalize understanding of LUE positioning to minimize pain and risk for injury.    Status  Achieved      OT SHORT TERM GOAL #4   Title  Pt will verbalize understanding of precautions related to sensory impairment    Status  Achieved      OT SHORT TERM GOAL #5   Title  Pt will demonstrate improved functional use of LUE as evidenced by decreasing 3 button/ unbutton to 35 secs or less    Status  Achieved   28.19 secs      OT Long Term Goals - 05/05/18 1117      OT LONG TERM GOAL #1   Title  I with updated HEP. due 05/25/18    Time  12    Period  Weeks    Status  Achieved   red theraband issued check on perfromance, green putty for composite grip.  05/05/18  met     OT LONG TERM GOAL #2   Title  Pt will resume use of LUE as dominant hand at least 70% of the time for ADLS/IADLs.    Time  12    Period  Weeks    Status  Achieved   50-60%.   05/05/18:   approx 75% of the time     OT LONG TERM GOAL #3   Title  Pt will demonstrate improved fine motor coordination as evidenced by decreasing LUE 9 hole peg test score to 25 secs or less.     Time  12    Period  Weeks    Status Not met. 26.22 secs     OT LONG TERM GOAL #4   Title   Pt will demonstrate ability to perform full composite finger flexion for increased LUE functional use.    Time  12    Period  Weeks    Status  Achieved         Remaining deficits: Mild decr coordination, intermittent tingling/numbness in 4-5th digits   Education / Equipment: Pt instructed in HEP, LUE positioning, and precautions due to sensory deficits.  Pt verbalized understanding of all education provided.  Plan: Patient agrees to discharge.  Patient goals were partially met. Patient is being discharged due to being pleased with the current functional level.  Pt chose to use last 2 visits for PT.  ?????         [facility] 05/12/2018, 9:03 AM  [facility] [address] [place], [place], [address] Phone: [phone]   Fax:  [place], OTR/L [facility] [address]  [place], [place]  [address] [phone] phone [phone] 05/12/18 9:03 AM

## 2018-05-12 NOTE — Therapy (Signed)
[facility] [address] [place], [place], [address] Phone: [phone]   Fax:  [phone]  Physical Therapy Treatment  Patient Details  Name: Alanda SlimBryan M Hazen MRN: [id number] Date of Birth: 10-03-1980 Referring Provider: Dr. Anne HahnWillis   Encounter Date: 05/12/2018  PT End of Session - 05/12/18 1103    Visit Number  20    Number of Visits  21   per recertification   Date for PT Re-Evaluation  05/21/18    Authorization Type  BC[employer]: $20 copay; VL combined with PT, OT, Chiro, in home.  30 VL    Authorization - Visit Number  20   updated due to entry error   Authorization - Number of Visits  30    PT Start Time  1017    PT Stop Time  1102    PT Time Calculation (min)  45 min    Activity Tolerance  Patient tolerated treatment well    Behavior During Therapy  [facility] for tasks assessed/performed       History reviewed. No pertinent past medical history.  Past Surgical History:  Procedure Laterality Date  . WISDOM TOOTH EXTRACTION      There were no vitals filed for this visit.  Subjective Assessment - 05/12/18 1022    Subjective  Visit with Dr. Anne HahnWillis went well; no f/u visits needed.  [place] yesterday - went well but only stayed in one lane.  Starting to apply to jobs.    Pertinent History  no PMH    Limitations  Walking    Diagnostic tests  Per neurology notes: The patient has had a normal MRI of the brain, normal audiometric testing, normal ENG evaluation    Patient Stated Goals  Get rid of the dizziness; can deal with neck pain and tingling    Currently in Pain?  Yes    Pain Score  1     Pain Location  Finger (Comment which one)    Pain Orientation  Left    Pain Descriptors / Indicators  Tingling    Pain Type  Neuropathic pain    Pain Onset  More than a month ago         [facility] - 05/12/18 1033      Ambulation/Gait   Ambulation/Gait  Yes    Ambulation/Gait Assistance  6:  Modified independent (Device/Increase time)    Ambulation/Gait Assistance Details  focus outside on quick head turns and changes in gait speed on sidewalk and then simulated crossing a street x 2    Ambulation Distance (Feet)  500 Feet    Assistive device  None    Gait Pattern  Step-through pattern    Ambulation Surface  Unlevel;Outdoor;Paved;Grass    Curb  6: Modified independent (Device/increase time)    Curb Details (indicate cue type and reason)  MOD I quickly with quick head turns to check for traffic      Standardized Balance Assessment   Standardized Balance Assessment  10 meter walk test;Dynamic Gait Index    10 Meter Walk  8.88 seconds or 3.69 ft/sec      Dynamic Gait Index   Level Surface  Normal    Change in Gait Speed  Mild Impairment    Gait with Horizontal Head Turns  Mild Impairment    Gait with Vertical Head Turns  Moderate Impairment    Gait and Pivot Turn  Normal    Step Over Obstacle  Normal  Step Around Obstacles  Normal    Steps  Normal    Total Score  20    DGI comment:  20/24 without cane         Vestibular Assessment - 05/12/18 1026      Positional Sensitivities   Sit to Supine  No dizziness    Supine to Left Side  Lightheadedness    Supine to Right Side  No dizziness    Supine to Sitting  No dizziness    Nose to Right Knee  No dizziness    Right Knee to Sitting  Lightedness    Nose to Left Knee  No dizziness    Left Knee to Sitting  Lightheadedness    Head Turning x 5  Mild dizziness   causes difficulty with looking L/R with driving   Head Nodding x 5  Moderate dizziness    Pivot Right in Standing  No dizziness    Pivot Left in Standing  Lightheadedness    Rolling Right  No dizziness    Rolling Left  Lightheadedness               [facility] Adult PT Treatment/Exercise - 05/12/18 1033      High Level Balance   High Level Balance Activities  Other (comment)    High Level Balance Comments  Basketball drills; 2 laps dribbling from L hand  to R hand while ambulating.  Walking while dribbling and tossing ball high in air x 2 laps             PT Education - 05/12/18 1103    Education provided  Yes    Education Details  goals achieved; drive to therapy next week.  Perform 10 basketball shots     Person(s) Educated  Patient    Methods  Explanation    Comprehension  Verbalized understanding       PT Short Term Goals - 04/14/18 1115      PT SHORT TERM GOAL #1   Title  Pt will demonstrate independence with updated balance, vestibular, cervical and driving HEP    Time  6    Period  Weeks    Status  Achieved      PT SHORT TERM GOAL #2   Title  Pt will improve gait velocity with cane to >/= 2.6 ft/sec    Baseline  2.8 ft/sec without the cane    Time  6    Period  Weeks    Status  Achieved      PT SHORT TERM GOAL #3   Title  Pt will improve DGI by 4 points to decrease risk for falls ambulating in the community    Baseline  16/24    Time  6    Period  Weeks    Status  Achieved      PT SHORT TERM GOAL #4   Title  Will report 50% improvement in dizziness with ambulation and head turns    Baseline  60% improvement    Time  6    Period  Weeks    Status  Achieved      PT SHORT TERM GOAL #5   Title  Pt will ambulate 230' indoors without cane with supervision and negotiate 8 stairs with one rail, alternating sequence with supervision and improved control when descending    Baseline  >230' without cane indoors, 8 stairs with one rail, alternating sequence with supervision    Time  6    Period  Weeks    Status  Achieved        PT Long Term Goals - 05/12/18 1025      PT LONG TERM GOAL #1   Title  Pt will demonstrate independence with neck ROM, vestibular, standing balance HEP    Time  12    Period  Weeks    Status  Revised      PT LONG TERM GOAL #2   Title  Pt will report 75% improvement in dizziness with head movements, turning and with gait and will tolerate gaze adaptation exercises in standing (x1  viewing).    Baseline  rates improvement at 75%; still has difficulty with looking up and looking L.  Tolerates x1 viewing in standing against busy background x 1 minute    Time  12    Period  Weeks    Status  Achieved      PT LONG TERM GOAL #3   Title  Pt will improve gait velocity to >/=2.8 ft/sec without AD for improved safety with gait in community    Baseline  3.6 ft/sec without cane    Time  12    Period  Weeks    Status  Achieved      PT LONG TERM GOAL #4   Title  Pt will improve DGI to >/= 18/24    Baseline  20/24    Time  12    Period  Weeks    Status  Achieved      PT LONG TERM GOAL #6   Title  Pt will ambulate x 250' over outdoor paved surfaces without cane at MOD I level; will report return to driving short distances along well known routes.    Baseline  driving longer distances common routes - still having difficulty looking to L or up; walking outside MOD I    Time  12    Period  Weeks    Status  Achieved            Plan - 05/12/18 1109    Clinical Impression Statement  Treatment session focused on assessment of LTG with pt meeting all 4 goals assessed today.  Initiated balance training and return to basketball - pt instructed to practice shooting this week and weekend.  Plan to check final LTG and HEP next session and D/C.  Pt agreeable to plan.     Rehab Potential  Good    PT Frequency  1x / week    PT Duration  12 weeks    PT Treatment/Interventions  ADLs/Self Care Home Management;Canalith Repostioning;Cryotherapy;Moist Heat;Traction;DME Instruction;Gait training;Stair training;Functional mobility training;Therapeutic exercise;Therapeutic activities;Balance training;Neuromuscular re-education;Patient/family education;Manual techniques;Passive range of motion;Dry needling;Taping;Vestibular    PT Next Visit Plan  finalize HEP; D/C    Consulted and Agree with Plan of Care  Patient       Patient will benefit from skilled therapeutic intervention in order to  improve the following deficits and impairments:  Abnormal gait, Decreased activity tolerance, Decreased balance, Decreased endurance, Decreased mobility, Decreased range of motion, Decreased strength, Difficulty walking, Dizziness, Impaired sensation, Postural dysfunction, Pain  Visit Diagnosis: Dizziness and giddiness  Cervicalgia  Unsteadiness on feet  Difficulty in walking, not elsewhere classified  Other disturbances of skin sensation  Muscle weakness (generalized)     Problem List There are no active problems to display for this patient.   Dierdre Highman, PT, DPT 05/12/18    11:15 AM    [facility] [address] [place], [place], [address] Phone: [phone]   Fax:  [phone]  Name: TRAYSON STITELY MRN: [id number] Date of Birth: 08-09-1981

## 2018-05-19 ENCOUNTER — Encounter: Payer: Self-pay | Admitting: Occupational Therapy

## 2018-05-19 ENCOUNTER — Ambulatory Visit: Payer: BLUE CROSS/BLUE SHIELD | Admitting: Physical Therapy

## 2018-05-19 DIAGNOSIS — M79602 Pain in left arm: Secondary | ICD-10-CM | POA: Diagnosis not present

## 2018-05-19 DIAGNOSIS — R29898 Other symptoms and signs involving the musculoskeletal system: Secondary | ICD-10-CM | POA: Diagnosis not present

## 2018-05-19 DIAGNOSIS — M6281 Muscle weakness (generalized): Secondary | ICD-10-CM | POA: Diagnosis not present

## 2018-05-19 DIAGNOSIS — R278 Other lack of coordination: Secondary | ICD-10-CM | POA: Diagnosis not present

## 2018-05-19 DIAGNOSIS — M542 Cervicalgia: Secondary | ICD-10-CM | POA: Diagnosis not present

## 2018-05-19 DIAGNOSIS — R262 Difficulty in walking, not elsewhere classified: Secondary | ICD-10-CM | POA: Diagnosis not present

## 2018-05-19 DIAGNOSIS — R42 Dizziness and giddiness: Secondary | ICD-10-CM

## 2018-05-19 DIAGNOSIS — R208 Other disturbances of skin sensation: Secondary | ICD-10-CM

## 2018-05-19 DIAGNOSIS — R2681 Unsteadiness on feet: Secondary | ICD-10-CM

## 2018-05-19 NOTE — Therapy (Signed)
[facility] [address] [place], [place], [address] Phone: [phone]   Fax:  [phone]  Physical Therapy Treatment and D/C Summary  Patient Details  Name: Herbert Davis MRN: [id number] Date of Birth: 25-Sep-1980 Referring Provider: Dr. Jannifer Franklin   Encounter Date: 05/19/2018  PT End of Session - 05/19/18 1205    Visit Number  21    Number of Visits  21   per recertification   Date for PT Re-Evaluation  05/21/18    Authorization Type  [employer]: $20 copay; VL combined with PT, OT, Chiro, in home.  30 VL    Authorization - Visit Number  21   updated due to entry error   Authorization - Number of Visits  30    PT Start Time  1107    PT Stop Time  1153    PT Time Calculation (min)  46 min    Activity Tolerance  Patient tolerated treatment well    Behavior During Therapy  [facility] for tasks assessed/performed       No past medical history on file.  Past Surgical History:  Procedure Laterality Date  . WISDOM TOOTH EXTRACTION      There were no vitals filed for this visit.  Subjective Assessment - 05/19/18 1204    Subjective  Drove here by himself today!  Did not have time to do basketball shooting.    Pertinent History  no PMH    Limitations  Walking    Diagnostic tests  Per neurology notes: The patient has had a normal MRI of the brain, normal audiometric testing, normal ENG evaluation    Patient Stated Goals  Get rid of the dizziness; can deal with neck pain and tingling    Currently in Pain?  No/denies    Pain Onset  More than a month ago       Reviewed and upgraded the following exercises for HEP.  Pt return demonstrated each exercise.  During changes in gait speed down hallway pt performed sudden stops and starts and added in basketball bounce pass for increased visual tracking and head movement.  Required seated rest break before leaving and driving home.    STANDING Bow-tie Head movements: NO LONGER SITTING -  STAND IN THE CORNER FEET APART. 5 REPETITIONS.  FEET TOGETHER 5 REPETITIONS.   FEET STAGGERED LEFT FOOT FORWARDS 5 REPETITIONS, RIGHT FOOT FORWARDS 5 REPETITIONS  Start by looking up and to one side, look down and across, up, down and across, up, down and across in shape of a bow-tie. Repeat 10 times    SIT TO STAND: Feet Narrow    Place feet together, STANDING ON A PILLOW. Lean chest forward. Raise hips and straighten knees to stand.  Come down slowly to a count of 6.  NO HANDS. _10__ reps per set, _2__ sets per day PERFORM ONE SET OF 5 REPETITIONS WITH EYES CLOSED.  Gaze Stabilization: Tip Card  1.Target must remain in focus, not blurry, and appear stationary while head is in motion. 2.Perform exercises with small head movements (45 to either side of midline). 3.Increase speed of head motion so long as target is in focus. 4.If you wear eyeglasses, be sure you can see target through lens (therapist will give specific instructions for bifocal / progressive lenses). 5.These exercises may provoke dizziness or nausea. Work through these symptoms. If too dizzy, slow head movement slightly. Rest between each exercise. 6.Exercises demand concentration; avoid distractions.  Gaze Stabilization: Standing Feet Apart  Blank background - Stand on a pillow - feet apart - side to side WORKING UP TO  60 seconds, up and down WORKING UP TO 60 seconds.  Busy background - feet TOGETHER - (NO PILLOW) - chair for support if needed - side to side WORKING UP TO 60 seconds, up and down WORKING UP TO 60 seconds  Do __2-3__ sessions per day.  __________________________________________________________________________________________________________________________  Seated Eye, Head and Arm movement:    Setup  Begin sitting upright with one arm holding a notecard with a letter written on it out in front of your face.  Movement  Keeping your eyes focused on the letter, slowly rotate your head  to one side while moving the notecard to the opposite side, then repeat in the other direction.  Tip  Make sure to sit tall and avoid turning your trunk as you move your head. Move your head as fast as you can while keeping the letter in focus.   10 REPETITIONS WORK TOWARDS 20 FOR SIDE TO SIDE.  6 REPETITIONS UP AND DOWN WORK TOWARDS 12 REPETITIONS  _______________________________________________________________________________________________________________________________________ Walking FORWARDS AND BACKWARDS CHANGING SPEED:  Walk forwards down your hallway starting at regular speed >> fast speed >> slow speed every 3-4 cycles of steps.  Repeat going backwards.  Perform 4 laps down hallway.  IF OUTSIDE ALSO PRACTICE DRIBBLING A BASKETBALL WITH CHANGING WALKING SPEED.     TRY TO PRACTICE SHOOTING THE BASKETBALL 10-20 REPETITIONS EVERY COUPLE OF DAYS.     PT Education - 05/19/18 1205    Education provided  Yes    Education Details  updates made to final HEP    Person(s) Educated  Patient    Methods  Explanation;Demonstration;Handout    Comprehension  Verbalized understanding;Returned demonstration       PT Short Term Goals - 04/14/18 1115      PT SHORT TERM GOAL #1   Title  Pt will demonstrate independence with updated balance, vestibular, cervical and driving HEP    Time  6    Period  Weeks    Status  Achieved      PT SHORT TERM GOAL #2   Title  Pt will improve gait velocity with cane to >/= 2.6 ft/sec    Baseline  2.8 ft/sec without the cane    Time  6    Period  Weeks    Status  Achieved      PT SHORT TERM GOAL #3   Title  Pt will improve DGI by 4 points to decrease risk for falls ambulating in the community    Baseline  16/24    Time  6    Period  Weeks    Status  Achieved      PT SHORT TERM GOAL #4   Title  Will report 50% improvement in dizziness with ambulation and head turns    Baseline  60% improvement    Time  6    Period  Weeks    Status  Achieved       PT SHORT TERM GOAL #5   Title  Pt will ambulate 230' indoors without cane with supervision and negotiate 8 stairs with one rail, alternating sequence with supervision and improved control when descending    Baseline  >230' without cane indoors, 8 stairs with one rail, alternating sequence with supervision    Time  6    Period  Weeks    Status  Achieved        PT Long  Term Goals - 05/19/18 1206      PT LONG TERM GOAL #1   Title  Pt will demonstrate independence with neck ROM, vestibular, standing balance HEP    Time  12    Period  Weeks    Status  Achieved      PT LONG TERM GOAL #2   Title  Pt will report 75% improvement in dizziness with head movements, turning and with gait and will tolerate gaze adaptation exercises in standing (x1 viewing).    Time  12    Period  Weeks    Status  Achieved      PT LONG TERM GOAL #3   Title  Pt will improve gait velocity to >/=2.8 ft/sec without AD for improved safety with gait in community    Baseline  3.6 ft/sec without cane    Time  12    Period  Weeks    Status  Achieved      PT LONG TERM GOAL #4   Title  Pt will improve DGI to >/= 18/24    Baseline  20/24    Time  12    Period  Weeks    Status  Achieved      PT LONG TERM GOAL #6   Title  Pt will ambulate x 250' over outdoor paved surfaces without cane at MOD I level; will report return to driving short distances along well known routes.    Baseline  driving longer distances common routes - still having difficulty looking to L or up; walking outside MOD I    Time  12    Period  Weeks    Status  Achieved            Plan - 05/19/18 1209    Clinical Impression Statement  Completed assessment of LTG with review of HEP and update of all exercises due to progress.  Pt given instructions on how to self-progress exercises at home.  Pt continues to demonstrate increased motion sensitivity to head movements up and to the L but pt demonstrates improved overall AROM and improved  balance/vestibular use with narrow BOS and on compliant surfaces.  Pt has also returned to driving.  Pt is agreeable to D/C today with comprehensive HEP.  Pt has met 5/5 LTG.    Rehab Potential  Good    PT Frequency  1x / week    PT Duration  12 weeks    PT Treatment/Interventions  ADLs/Self Care Home Management;Canalith Repostioning;Cryotherapy;Moist Heat;Traction;DME Instruction;Gait training;Stair training;Functional mobility training;Therapeutic exercise;Therapeutic activities;Balance training;Neuromuscular re-education;Patient/family education;Manual techniques;Passive range of motion;Dry needling;Taping;Vestibular    Consulted and Agree with Plan of Care  Patient       Patient will benefit from skilled therapeutic intervention in order to improve the following deficits and impairments:  Abnormal gait, Decreased activity tolerance, Decreased balance, Decreased endurance, Decreased mobility, Decreased range of motion, Decreased strength, Difficulty walking, Dizziness, Impaired sensation, Postural dysfunction, Pain  Visit Diagnosis: Dizziness and giddiness  Cervicalgia  Unsteadiness on feet  Difficulty in walking, not elsewhere classified  Other disturbances of skin sensation  Muscle weakness (generalized)     Problem List There are no active problems to display for this patient.   PHYSICAL THERAPY DISCHARGE SUMMARY  Visits from Start of Care: 21  Current functional level related to goals / functional outcomes: See LTG achievement and impression statement above   Remaining deficits: Dizziness and impaired balance, decreased cervical AROM   Education / Equipment: HEP  Plan: Patient  agrees to discharge.  Patient goals were met. Patient is being discharged due to meeting the stated rehab goals.  ?????    Rico Junker, PT, DPT 05/19/18    12:14 PM   [facility] [address] [place], [place],  [address] Phone: [phone]   Fax:  [phone]  Name: Herbert Davis MRN: [id number] Date of Birth: 10-Dec-1980

## 2018-05-19 NOTE — Patient Instructions (Signed)
   STANDING Bow-tie Head movements: NO LONGER SITTING - STAND IN THE CORNER FEET APART. 5 REPETITIONS.  FEET TOGETHER 5 REPETITIONS.   FEET STAGGERED LEFT FOOT FORWARDS 5 REPETITIONS, RIGHT FOOT FORWARDS 5 REPETITIONS  Start by looking up and to one side, look down and across, up, down and across, up, down and across in shape of a bow-tie. Repeat 10 times    SIT TO STAND: Feet Narrow    Place feet together, STANDING ON A PILLOW. Lean chest forward. Raise hips and straighten knees to stand.  Come down slowly to a count of 6.  NO HANDS. _10__ reps per set, _2__ sets per day PERFORM ONE SET OF 5 REPETITIONS WITH EYES CLOSED.  Gaze Stabilization: Tip Card  1.Target must remain in focus, not blurry, and appear stationary while head is in motion. 2.Perform exercises with small head movements (45 to either side of midline). 3.Increase speed of head motion so long as target is in focus. 4.If you wear eyeglasses, be sure you can see target through lens (therapist will give specific instructions for bifocal / progressive lenses). 5.These exercises may provoke dizziness or nausea. Work through these symptoms. If too dizzy, slow head movement slightly. Rest between each exercise. 6.Exercises demand concentration; avoid distractions.  Gaze Stabilization: Standing Feet Apart    Blank background - Stand on a pillow - feet apart - side to side WORKING UP TO  60 seconds, up and down WORKING UP TO 60 seconds.  Busy background - feet TOGETHER - (NO PILLOW) - chair for support if needed - side to side WORKING UP TO 60 seconds, up and down WORKING UP TO 60 seconds  Do __2-3__ sessions per day.  __________________________________________________________________________________________________________________________  Seated Eye, Head and Arm movement:    Setup  Begin sitting upright with one arm holding a notecard with a letter written on it out in front of your face.  Movement  Keeping  your eyes focused on the letter, slowly rotate your head to one side while moving the notecard to the opposite side, then repeat in the other direction.  Tip  Make sure to sit tall and avoid turning your trunk as you move your head. Move your head as fast as you can while keeping the letter in focus.   10 REPETITIONS WORK TOWARDS 20 FOR SIDE TO SIDE.  6 REPETITIONS UP AND DOWN WORK TOWARDS 12 REPETITIONS  _______________________________________________________________________________________________________________________________________ Walking FORWARDS AND BACKWARDS CHANGING SPEED:  Walk forwards down your hallway starting at regular speed >> fast speed >> slow speed every 3-4 cycles of steps.  Repeat going backwards.  Perform 4 laps down hallway.  IF OUTSIDE ALSO PRACTICE DRIBBLING A BASKETBALL WITH CHANGING WALKING SPEED.     TRY TO PRACTICE SHOOTING THE BASKETBALL 10-20 REPETITIONS EVERY COUPLE OF DAYS.

## 2018-06-29 DIAGNOSIS — Z131 Encounter for screening for diabetes mellitus: Secondary | ICD-10-CM | POA: Diagnosis not present

## 2018-06-29 DIAGNOSIS — H9319 Tinnitus, unspecified ear: Secondary | ICD-10-CM | POA: Diagnosis not present

## 2018-06-29 DIAGNOSIS — Z13220 Encounter for screening for lipoid disorders: Secondary | ICD-10-CM | POA: Diagnosis not present

## 2018-06-29 DIAGNOSIS — R2681 Unsteadiness on feet: Secondary | ICD-10-CM | POA: Diagnosis not present

## 2018-06-29 DIAGNOSIS — Z23 Encounter for immunization: Secondary | ICD-10-CM | POA: Diagnosis not present

## 2018-06-29 DIAGNOSIS — Z0000 Encounter for general adult medical examination without abnormal findings: Secondary | ICD-10-CM | POA: Diagnosis not present

## 2018-06-29 DIAGNOSIS — M542 Cervicalgia: Secondary | ICD-10-CM | POA: Diagnosis not present

## 2018-06-29 DIAGNOSIS — E559 Vitamin D deficiency, unspecified: Secondary | ICD-10-CM | POA: Diagnosis not present

## 2018-08-10 DIAGNOSIS — E785 Hyperlipidemia, unspecified: Secondary | ICD-10-CM | POA: Diagnosis not present

## 2018-08-10 DIAGNOSIS — E669 Obesity, unspecified: Secondary | ICD-10-CM | POA: Diagnosis not present

## 2018-08-10 DIAGNOSIS — R7303 Prediabetes: Secondary | ICD-10-CM | POA: Diagnosis not present

## 2018-08-10 DIAGNOSIS — R030 Elevated blood-pressure reading, without diagnosis of hypertension: Secondary | ICD-10-CM | POA: Diagnosis not present

## 2018-09-12 DIAGNOSIS — E669 Obesity, unspecified: Secondary | ICD-10-CM | POA: Diagnosis not present

## 2018-09-12 DIAGNOSIS — H9319 Tinnitus, unspecified ear: Secondary | ICD-10-CM | POA: Diagnosis not present

## 2018-09-12 DIAGNOSIS — E559 Vitamin D deficiency, unspecified: Secondary | ICD-10-CM | POA: Diagnosis not present

## 2018-11-29 DIAGNOSIS — E669 Obesity, unspecified: Secondary | ICD-10-CM | POA: Diagnosis not present

## 2018-11-29 DIAGNOSIS — R7303 Prediabetes: Secondary | ICD-10-CM | POA: Diagnosis not present

## 2018-11-29 DIAGNOSIS — E559 Vitamin D deficiency, unspecified: Secondary | ICD-10-CM | POA: Diagnosis not present

## 2018-11-29 DIAGNOSIS — E785 Hyperlipidemia, unspecified: Secondary | ICD-10-CM | POA: Diagnosis not present

## 2019-02-27 ENCOUNTER — Ambulatory Visit (INDEPENDENT_AMBULATORY_CARE_PROVIDER_SITE_OTHER): Payer: Self-pay | Admitting: Nurse Practitioner

## 2019-02-27 DIAGNOSIS — Z6828 Body mass index (BMI) 28.0-28.9, adult: Secondary | ICD-10-CM | POA: Diagnosis not present

## 2019-02-27 DIAGNOSIS — E785 Hyperlipidemia, unspecified: Secondary | ICD-10-CM | POA: Diagnosis not present

## 2019-02-27 DIAGNOSIS — E663 Overweight: Secondary | ICD-10-CM | POA: Diagnosis not present

## 2019-02-27 DIAGNOSIS — R7303 Prediabetes: Secondary | ICD-10-CM | POA: Diagnosis not present

## 2019-08-28 ENCOUNTER — Other Ambulatory Visit: Payer: Self-pay

## 2019-08-28 ENCOUNTER — Encounter (INDEPENDENT_AMBULATORY_CARE_PROVIDER_SITE_OTHER): Payer: Self-pay | Admitting: Internal Medicine

## 2019-08-28 ENCOUNTER — Ambulatory Visit (INDEPENDENT_AMBULATORY_CARE_PROVIDER_SITE_OTHER): Payer: BC Managed Care – PPO | Admitting: Internal Medicine

## 2019-08-28 VITALS — BP 128/73 | HR 86 | Temp 97.9°F | Resp 18 | Ht 71.0 in | Wt 213.0 lb

## 2019-08-28 DIAGNOSIS — R7303 Prediabetes: Secondary | ICD-10-CM

## 2019-08-28 DIAGNOSIS — Z23 Encounter for immunization: Secondary | ICD-10-CM | POA: Diagnosis not present

## 2019-08-28 DIAGNOSIS — Z0001 Encounter for general adult medical examination with abnormal findings: Secondary | ICD-10-CM

## 2019-08-28 DIAGNOSIS — E559 Vitamin D deficiency, unspecified: Secondary | ICD-10-CM | POA: Diagnosis not present

## 2019-08-28 DIAGNOSIS — E782 Mixed hyperlipidemia: Secondary | ICD-10-CM | POA: Diagnosis not present

## 2019-08-28 HISTORY — DX: Prediabetes: R73.03

## 2019-08-28 HISTORY — DX: Vitamin D deficiency, unspecified: E55.9

## 2019-08-28 NOTE — Progress Notes (Signed)
   Chief Complaint: This 38 year old man comes in for an annual physical exam. HPI: He is doing reasonably well.  He did have a history of prediabetes with a hemoglobin A1c of 5.7% in 2019 but this year his hemoglobin A1c has come down to 5.6% but we will check it again. He does have vitamin D deficiency and does take vitamin D3 supplementation. He feels reasonably well otherwise.  Past Medical History:  Diagnosis Date  . Prediabetes 08/28/2019  . Vitamin D deficiency disease 08/28/2019   Past Surgical History:  Procedure Laterality Date  . WISDOM TOOTH EXTRACTION       Social History   Social History Narrative   Lives with parents .Master's degree in divinity. Unemployed. Single.    Social History   Tobacco Use  . Smoking status: Never Smoker  . Smokeless tobacco: Never Used  Substance Use Topics  . Alcohol use: No      Allergies: No Known Allergies   Current Meds  Medication Sig  . Cholecalciferol (VITAMIN D-3) 125 MCG (5000 UT) TABS Take 1 tablet by mouth daily.      QQP:YPPJK from the symptoms mentioned above,there are no other symptoms referable to all systems reviewed.  Physical Exam: Blood pressure 128/73, pulse 86, temperature 97.9 F (36.6 C), temperature source Temporal, resp. rate 18, height '5\' 11"'$  (1.803 m), weight 213 lb (96.6 kg), SpO2 96 %. Vitals with BMI 08/28/2019 05/06/2018 10/26/2017  Height '5\' 11"'$  '5\' 11"'$  '5\' 11"'$   Weight 213 lbs 228 lbs 219 lbs 8 oz  BMI 29.72 93.26 71.24  Systolic 580 998 338  Diastolic 73 250 84  Pulse 86 98 127      He looks systemically well, he is overweight, almost obese. General: Alert, cooperative, and appears to be the stated age.No pallor.  No jaundice.  No clubbing. Head: Normocephalic Eyes: Sclera white, pupils equal and reactive to light, red reflex x 2,  Ears: Normal bilaterally Oral cavity: Lips, mucosa, and tongue normal: Teeth and gums normal Neck: No adenopathy, supple, symmetrical, trachea midline, and  thyroid does not appear enlarged Respiratory: Clear to auscultation bilaterally.No wheezing, crackles or bronchial breathing. Cardiovascular: Heart sounds are present and appear to be normal without murmurs or added sounds.  No carotid bruits.  Peripheral pulses are present and equal bilaterally.: Gastrointestinal:positive bowel sounds, no hepatosplenomegaly.  No masses felt.No tenderness. Skin: Clear, No rashes noted.No worrisome skin lesions seen. Neurological: Grossly intact without focal findings, cranial nerves II through XII intact, muscle strength equal bilaterally Musculoskeletal: No acute joint abnormalities noted.Full range of movement noted with joints. Psychiatric: Affect appropriate, non-anxious.    Assessment  1. Encounter for general adult medical examination with abnormal findings   2. Vitamin D deficiency disease   3. Prediabetes   4. Mixed hyperlipidemia     Tests Ordered:   Orders Placed This Encounter  Procedures  . CBC  . CMP with eGFR([facility])  . Hemoglobin A1c  . Lipid Panel  . T3, Free  . T4  . TSH  . Vitamin D, 25-hydroxy     Plan  1. Blood work is ordered  as above. 2. He was given influenza and Tdap vaccination today. 3. Further recommendations will depend on blood results and I will have him follow-up with Sarah in about 6 months time.     No orders of the defined types were placed in this encounter.    Nimish C [facility]   08/28/2019, 4:33 PM

## 2019-08-28 NOTE — Patient Instructions (Signed)
[facility] Dietary Recommendations for Weight Loss What to Avoid . Avoid added sugars o Often added sugar can be found in processed foods such as many condiments, dry cereals, cakes, cookies, chips, crisps, crackers, candies, sweetened drinks, etc.  o Read labels and AVOID/DECREASE use of foods with the following in their ingredient list: Sugar, fructose, high fructose corn syrup, sucrose, glucose, maltose, dextrose, molasses, cane sugar, brown sugar, any type of syrup, agave nectar, etc.   . Avoid snacking in between meals . Avoid foods made with flour o If you are going to eat food made with flour, choose those made with whole-grains; and, minimize your consumption as much as is tolerable . Avoid processed foods o These foods are generally stocked in the middle of the grocery store. Focus on shopping on the perimeter of the grocery.  What to Include . Vegetables o GREEN LEAFY VEGETABLES: Kale, spinach, mustard greens, collard greens, cabbage, broccoli, etc. o OTHER: Asparagus, cauliflower, eggplant, carrots, peas, Brussel sprouts, tomatoes, bell peppers, zucchini, beets, cucumbers, etc. . Grains, seeds, and legumes o Beans: kidney beans, black eyed peas, garbanzo beans, black beans, pinto beans, etc. o Whole, unrefined grains: brown rice, barley, bulgur, oatmeal, etc. . Healthy fats  o Avoid highly processed fats such as vegetable oil o Examples of healthy fats: avocado, olives, virgin olive oil, dark chocolate (?72% Cocoa), nuts (peanuts, almonds, walnuts, cashews, pecans, etc.) . Low - Moderate Intake of Animal Sources of Protein o Meat sources: chicken, turkey, salmon, tuna. Limit to 4 ounces of meat at one time. o Consider limiting dairy sources, but when choosing dairy focus on: PLAIN Greek yogurt, cottage cheese, high-protein milk . Fruit o Choose berries  When to Eat . Intermittent Fasting: o Choosing not to eat for a specific time period, but DO FOCUS ON HYDRATION  when fasting o Multiple Techniques: - Time Restricted Eating: eat 3 meals in a day, each meal lasting no more than 60 minutes, no snacks between meals - 16-18 hour fast: fast for 16 to 18 hours up to 7 days a week. Often suggested to start with 2-3 nonconsecutive days per week.  . Remember the time you sleep is counted as fasting.  . Examples of eating schedule: Fast from 7:00pm-11:00am. Eat between 11:00am-7:00pm.  - 24-hour fast: fast for 24 hours up to every other day. Often suggested to start with 1 day per week . Remember the time you sleep is counted as fasting . Examples of eating schedule:  o Eating day: eat 2-3 meals on your eating day. If doing 2 meals, each meal should last no more than 90 minutes. If doing 3 meals, each meal should last no more than 60 minutes. Finish last meal by 7:00pm. o Fasting day: Fast until 7:00pm.  o IF YOU FEEL UNWELL FOR ANY REASON/IN ANY WAY WHEN FASTING, STOP FASTING BY EATING A NUTRITIOUS SNACK OR LIGHT MEAL o ALWAYS FOCUS ON HYDRATION DURING FASTS - Acceptable Hydration sources: water, broths, tea/coffee (black tea/coffee is best but using a small amount of whole-fat dairy products in coffee/tea is acceptable).  - Poor Hydration Sources: anything with sugar or artificial sweeteners added to it  These recommendations have been developed for patients that are actively receiving medical care from either Dr. Nathan Stallworth or Sarah Gray, DNP, NP-C at [facility]. These recommendations are developed for patients with specific medical conditions and are not meant to be distributed or used by others that are not actively receiving care from either provider listed   above at [facility]. It is not appropriate to participate in the above eating plans without proper medical supervision.   Reference: Fung, J. The obesity code. [place]: [place]; 2016.   

## 2019-08-29 LAB — COMPLETE METABOLIC PANEL WITH GFR
AG Ratio: 1.7 (calc) (ref 1.0–2.5)
ALT: 17 U/L (ref 9–46)
AST: 17 U/L (ref 10–40)
Albumin: 4.5 g/dL (ref 3.6–5.1)
Alkaline phosphatase (APISO): 78 U/L (ref 36–130)
BUN: 12 mg/dL (ref 7–25)
CO2: 26 mmol/L (ref 20–32)
Calcium: 9.7 mg/dL (ref 8.6–10.3)
Chloride: 103 mmol/L (ref 98–110)
Creat: 1.17 mg/dL (ref 0.60–1.35)
GFR, Est African American: 91 mL/min/{1.73_m2} (ref >=60)
GFR, Est Non African American: 79 mL/min/{1.73_m2} (ref >=60)
Globulin: 2.7 g/dL (calc) (ref 1.9–3.7)
Glucose, Bld: 90 mg/dL (ref 65–99)
Potassium: 3.8 mmol/L (ref 3.5–5.3)
Sodium: 139 mmol/L (ref 135–146)
Total Bilirubin: 0.4 mg/dL (ref 0.2–1.2)
Total Protein: 7.2 g/dL (ref 6.1–8.1)

## 2019-08-29 LAB — LIPID PANEL
Cholesterol: 185 mg/dL (ref <200)
HDL: 33 mg/dL — ABNORMAL LOW (ref >=40)
LDL Cholesterol (Calc): 121 mg/dL (calc) — ABNORMAL HIGH
Non-HDL Cholesterol (Calc): 152 mg/dL (calc) — ABNORMAL HIGH (ref <130)
Total CHOL/HDL Ratio: 5.6 (calc) — ABNORMAL HIGH (ref <5.0)
Triglycerides: 184 mg/dL — ABNORMAL HIGH (ref <150)

## 2019-08-29 LAB — T4: T4, Total: 8.7 ug/dL (ref 4.9–10.5)

## 2019-08-29 LAB — CBC
HCT: 47.1 % (ref 38.5–50.0)
Hemoglobin: 15.8 g/dL (ref 13.2–17.1)
MCH: 28 pg (ref 27.0–33.0)
MCHC: 33.5 g/dL (ref 32.0–36.0)
MCV: 83.4 fL (ref 80.0–100.0)
MPV: 10.8 fL (ref 7.5–12.5)
Platelets: 327 10*3/uL (ref 140–400)
RBC: 5.65 10*6/uL (ref 4.20–5.80)
RDW: 12.8 % (ref 11.0–15.0)
WBC: 9.5 10*3/uL (ref 3.8–10.8)

## 2019-08-29 LAB — HEMOGLOBIN A1C
Hgb A1c MFr Bld: 5.5 % of total Hgb (ref <5.7)
Mean Plasma Glucose: 111 (calc)
eAG (mmol/L): 6.2 (calc)

## 2019-08-29 LAB — TSH: TSH: 1.1 mIU/L (ref 0.40–4.50)

## 2019-08-29 LAB — VITAMIN D 25 HYDROXY (VIT D DEFICIENCY, FRACTURES): Vit D, 25-Hydroxy: 56 ng/mL (ref 30–100)

## 2019-08-29 LAB — T3, FREE: T3, Free: 3.1 pg/mL (ref 2.3–4.2)

## 2019-10-10 ENCOUNTER — Other Ambulatory Visit: Payer: Self-pay

## 2019-10-10 ENCOUNTER — Ambulatory Visit (INDEPENDENT_AMBULATORY_CARE_PROVIDER_SITE_OTHER): Payer: 59 | Admitting: Nurse Practitioner

## 2019-10-10 VITALS — BP 131/85 | HR 100 | Temp 98.2°F | Resp 14 | Ht 71.0 in | Wt 212.4 lb

## 2019-10-10 DIAGNOSIS — K612 Anorectal abscess: Secondary | ICD-10-CM | POA: Diagnosis not present

## 2019-10-10 DIAGNOSIS — L0232 Furuncle of buttock: Secondary | ICD-10-CM

## 2019-10-10 MED ORDER — SULFAMETHOXAZOLE-TRIMETHOPRIM 800-160 MG PO TABS: 1.0000 | ORAL_TABLET | Freq: Two times a day (BID) | ORAL | 0 refills | Status: DC

## 2019-10-10 NOTE — Patient Instructions (Signed)
Thank you for choosing [facility] as your medical provider! If you have any questions or concerns regarding your health care, please do not hesitate to call our office.  I have prescribed the antibiotic Bactrim for you.  Please take this as prescribed.  As we discussed in the office if you develop a painful rash especially accompanied by pain in mucosal areas such as your mouth or eyes stop the medication right away and seek medical evaluation.  Use over-the-counter medications for pain relief such as Tylenol and/or ibuprofen.  I have also sent referral to general surgeon for further evaluation and possible management.  If you do not hear from this office in 2 weeks please give our office a call back so we can make sure the referral was completed.  Please follow-up as scheduled in May. We look forward to seeing you again soon!  At [facility] we value your feedback. You may receive a survey about your visit today. Please share your experience as we strive to create trusting relationships with our patients to provide genuine, compassionate, quality care.  We appreciate your understanding and patience as we review any laboratory studies, imaging, and other diagnostic tests that are ordered as we care for you. We do our best to address any and all results in a timely manner. If you do not hear about test results within 1 week, please do not hesitate to contact us. If we referred you to a specialist during your visit or ordered imaging testing, contact the office if you have not been contacted to be scheduled within 1 weeks.  We also encourage the use of MyChart, which contains your medical information for your review as well. If you are not enrolled in this feature, an access code is on this after visit summary for your convenience. Thank you for allowing [place] to be involved in your care.

## 2019-10-10 NOTE — Assessment & Plan Note (Signed)
Skin lesion appears consistent with abscess.  I will prescribe him antibiotic.  I will prescribe him a course of Bactrim.  I did warn him of the possible risk for Stevens-Johnson syndrome and that if he were to develop a rash especially accompanied with pain of his mucosal area that he should stop the medication right away.  He tells me he understands.  I will also send referral to general surgeon for further evaluation and management of possible pilonidal cyst.  He was encouraged to call the office with any questions or concerns prior to his next appointment.

## 2019-10-10 NOTE — Progress Notes (Signed)
Subjective:  Patient ID: Herbert [facility], male    DOB: 12-Dec-1980  Age: 39 y.o. MRN: [id number]  CC:  Chief Complaint  Patient presents with  . other    boil      HPI  This patient arrives today for an acute visit regarding a boil that erupted on his skin.  It is located right above his gluteal fold.  He tells me that he started noticing the pain about 7 days ago.  He rates the pain from 3-6/10.  Putting any kind of pressure on the area causes the pain to get worse.  He has tried warm compresses which have helped the pain mildly.  He has not tried any over-the-counter medication.  He tells me that he had a similar episode approximately 2 years ago but did not require medical intervention at that time.  He tells me that he has a brother who had to have surgery for a similar problem before.   Past Medical History:  Diagnosis Date  . Prediabetes 08/28/2019  . Vitamin D deficiency disease 08/28/2019      Family History  Problem Relation Age of Onset  . Healthy Mother   . Healthy Father   . Healthy Sister   . Healthy Brother   . Healthy Brother     Social History   Social History Narrative   Lives with parents .Master's degree in divinity. Unemployed. Single.   Social History   Tobacco Use  . Smoking status: Never Smoker  . Smokeless tobacco: Never Used  Substance Use Topics  . Alcohol use: No     No outpatient medications have been marked as taking for the 10/10/19 encounter (Office Visit) with Elenore Paddy, NP.    ROS:  Review of Systems  Constitutional: Negative for fever.     Objective:   Today's Vitals: Pulse 100   Temp 98.2 F (36.8 C)   Resp 14   Ht 5\' 11"  (1.803 m)   Wt 212 lb 6.4 oz (96.3 kg)   SpO2 99%   BMI 29.62 kg/m  Vitals with BMI 10/10/2019 08/28/2019 05/06/2018  Height 5\' 11"  5\' 11"  5\' 11"   Weight 212 lbs 6 oz 213 lbs 228 lbs  BMI 29.64 29.72 31.81  Systolic - 128 145  Diastolic - 73 100  Pulse 100 86 98     Physical  Exam Constitutional:      Appearance: Normal appearance.  HENT:     Head: Normocephalic and atraumatic.  Skin:      Neurological:     General: No focal deficit present.     Mental Status: He is alert.  Psychiatric:        Mood and Affect: Mood normal.        Behavior: Behavior normal.        Thought Content: Thought content normal.        Judgment: Judgment normal.          Assessment   1. Abscess of anal and rectal regions       Tests ordered Orders Placed This Encounter  Procedures  . Ambulatory referral to General Surgery     Plan: Please see assessment and plan per problem list below.   Meds ordered this encounter  Medications  . sulfamethoxazole-trimethoprim (BACTRIM DS) 800-160 MG tablet    Sig: Take 1 tablet by mouth 2 (two) times daily.    Dispense:  14 tablet    Refill:  0  Order Specific Question:   Supervising Provider    Answer:   Doree Albee [[id number]]    Patient to follow-up as scheduled in May or sooner if needed.  Ailene Ards, NP

## 2019-10-19 ENCOUNTER — Other Ambulatory Visit: Payer: Self-pay

## 2019-10-19 ENCOUNTER — Ambulatory Visit (INDEPENDENT_AMBULATORY_CARE_PROVIDER_SITE_OTHER): Payer: 59 | Admitting: General Surgery

## 2019-10-19 ENCOUNTER — Encounter: Payer: Self-pay | Admitting: General Surgery

## 2019-10-19 VITALS — BP 114/74 | HR 87 | Temp 97.7°F | Resp 16 | Ht 71.0 in | Wt 211.0 lb

## 2019-10-19 DIAGNOSIS — L0501 Pilonidal cyst with abscess: Secondary | ICD-10-CM | POA: Diagnosis not present

## 2019-10-19 DIAGNOSIS — K612 Anorectal abscess: Secondary | ICD-10-CM

## 2019-10-19 MED ORDER — SULFAMETHOXAZOLE-TRIMETHOPRIM 800-160 MG PO TABS: 1.0000 | ORAL_TABLET | Freq: Two times a day (BID) | ORAL | 0 refills | Status: DC

## 2019-10-19 NOTE — Patient Instructions (Signed)
Pilonidal Cyst  A pilonidal cyst is a fluid-filled sac that forms beneath the skin near the tailbone, at the top of the crease of the buttocks (pilonidal area). If the cyst is not large and not infected, it may not cause any problems. If the cyst becomes irritated or infected, it may get larger and fill with pus. An infected cyst is called an abscess. A pilonidal abscess may cause pain and swelling, and it may need to be drained or removed. What are the causes? The cause of this condition is not always known. In some cases, a hair that grows into your skin (ingrown hair) may be the cause. What increases the risk? You are more likely to get a pilonidal cyst if you:  Are male.  Have lots of hair near the crease of the buttocks.  Are overweight.  Have a dimple near the crease of the buttocks.  Wear tight clothing.  Do not bathe or shower often.  Sit for long periods of time. What are the signs or symptoms? Signs and symptoms of a pilonidal cyst may include pain, swelling, redness, and warmth in the pilonidal area. Depending on how big the cyst is, you may be able to feel a lump near your tailbone. If your cyst becomes infected, symptoms may include:  Pus or fluid drainage.  Fever.  Pain, swelling, and redness getting worse.  The lump getting bigger. How is this diagnosed? This condition may be diagnosed based on:  Your symptoms and medical history.  A physical exam.  A blood test to check for infection.  Testing a pus sample, if applicable. How is this treated? If your cyst does not cause symptoms, you may not need any treatment. If your cyst bothers you or is infected, you may need a procedure to drain or remove the cyst. Depending on the size, location, and severity of your cyst, your health care provider may:  Make an incision in the cyst and drain it (incision and drainage).  Open and drain the cyst, and then stitch the wound so that it stays open while it heals  (marsupialization). You will be given instructions about how to care for your open wound while it heals.  Remove all or part of the cyst, and then close the wound (cyst removal). You may need to take antibiotic medicines before your procedure. Follow these instructions at home: Medicines  Take over-the-counter and prescription medicines only as told by your health care provider.  If you were prescribed an antibiotic medicine, take it as told by your health care provider. Do not stop taking the antibiotic even if you start to feel better. General instructions  Keep the area around your pilonidal cyst clean and dry.  If there is fluid or pus draining from your cyst: ? Cover the area with a clean bandage (dressing) as needed. ? Wash the area gently with soap and water. Pat the area dry with a clean towel. Do not rub the area because that may cause bleeding.  Remove hair from the area around the cyst only if your health care provider tells you to do this.  Do not wear tight pants or sit in one position for long periods at a time.  Keep all follow-up visits as told by your health care provider. This is important. Contact a health care provider if you have:  New redness, swelling, or pain.  A fever.  Severe pain. Summary  A pilonidal cyst is a fluid-filled sac that forms beneath the   skin near the tailbone, at the top of the crease of the buttocks (pilonidal area).  If the cyst becomes irritated or infected, it may get larger and fill with pus. An infected cyst is called an abscess.  The cause of this condition is not always known. In some cases, a hair that grows into your skin (ingrown hair) may be the cause.  If your cyst does not cause symptoms, you may not need any treatment. If your cyst bothers you or is infected, you may need a procedure to drain or remove the cyst. This information is not intended to replace advice given to you by your health care provider. Make sure you  discuss any questions you have with your health care provider. Document Revised: 08/26/2017 Document Reviewed: 08/26/2017 Elsevier Patient Education  2020 Elsevier Inc.  

## 2019-10-19 NOTE — Progress Notes (Signed)
Herbert Davis; [id number]; 20-Aug-1981   HPI Patient is a 39 year old white male who was referred to my care by Dr. Hurshel Party for evaluation treatment of a pilonidal cyst.  Patient was seen by him recently and noted to have an infected pilonidal cyst.  He was started on Bactrim for 1 week.  Patient states he is draining from 1 bulla around the pilonidal cyst region.  He does have pain over the coccyx.  The pain is made worse when sitting.  He rates the pain as 5 out of 10.  He has no fevers.  He states he has had inflammation around the coccyx in the past, but it resolved on its own.  He is never had the area incised. Past Medical History:  Diagnosis Date  . Prediabetes 08/28/2019  . Vitamin D deficiency disease 08/28/2019    Past Surgical History:  Procedure Laterality Date  . WISDOM TOOTH EXTRACTION      Family History  Problem Relation Age of Onset  . Healthy Mother   . Healthy Father   . Healthy Sister   . Healthy Brother   . Healthy Brother     Current Outpatient Medications on File Prior to Visit  Medication Sig Dispense Refill  . Cholecalciferol (VITAMIN D-3) 125 MCG (5000 UT) TABS Take 1 tablet by mouth daily.     No current facility-administered medications on file prior to visit.    No Known Allergies  Social History   Substance and Sexual Activity  Alcohol Use No    Social History   Tobacco Use  Smoking Status Never Smoker  Smokeless Tobacco Never Used    Review of Systems  Constitutional: Negative.   HENT: Negative.   Eyes: Negative.   Respiratory: Negative.   Cardiovascular: Negative.   Gastrointestinal: Negative.   Genitourinary: Negative.   Musculoskeletal: Negative.   Skin:       Boils  Neurological: Positive for dizziness.  Endo/Heme/Allergies: Negative.   Psychiatric/Behavioral: Negative.     Objective   Vitals:   10/19/19 1021  BP: 114/74  Pulse: 87  Resp: 16  Temp: 97.7 F (36.5 C)  SpO2: 96%    Physical Exam Vitals  reviewed.  Constitutional:      Appearance: Normal appearance. He is normal weight. He is not ill-appearing.  HENT:     Head: Normocephalic and atraumatic.  Cardiovascular:     Rate and Rhythm: Normal rate and regular rhythm.     Heart sounds: Normal heart sounds. No murmur. No friction rub. No gallop.   Pulmonary:     Effort: Pulmonary effort is normal. No respiratory distress.     Breath sounds: Normal breath sounds. No stridor. No wheezing, rhonchi or rales.  Skin:    Comments: 2 punctum's are present in the midline over the coccyx with subcutaneous sinuses extending to the left buttock cheek.  Granuloma with some drainage noted at the superior 1 with a bulla present inferiorly.  Both are smaller than 1 cm in size.  No significant erythema is noted.  Neurological:     Mental Status: He is alert and oriented to person, place, and time.     Assessment  Pilonidal cyst with abscess Plan   Patient will ultimately need excision of a pilonidal cyst.  Would like to treat with 1 more week of Bactrim as there is still inflammation present.  The extension of the sinuses makes this a more complicated pilonidal cyst to take care of.  I will see him  again in 2 weeks for follow-up.

## 2019-11-02 ENCOUNTER — Other Ambulatory Visit: Payer: Self-pay

## 2019-11-02 ENCOUNTER — Ambulatory Visit: Payer: 59 | Admitting: General Surgery

## 2019-11-02 ENCOUNTER — Encounter: Payer: Self-pay | Admitting: General Surgery

## 2019-11-02 VITALS — BP 115/76 | HR 86 | Temp 98.3°F | Resp 14 | Ht 71.0 in | Wt 214.6 lb

## 2019-11-02 DIAGNOSIS — L0591 Pilonidal cyst without abscess: Secondary | ICD-10-CM | POA: Diagnosis not present

## 2019-11-02 NOTE — Progress Notes (Signed)
Subjective:     Herbert Davis  Here for wound check of pilonidal cyst.  Patient states the antibiotics have worked.  He has had no drainage or pain in the pilonidal area. Objective:    BP 115/76   Pulse 86   Temp 98.3 F (36.8 C) (Oral)   Resp 14   Ht 5\' 11"  (1.803 m)   Wt 214 lb 9.6 oz (97.3 kg)   SpO2 96%   BMI 29.93 kg/m   General:  alert, cooperative and no distress  The 2 areas to the left of the coccyx have healed.  There is induration deep in both areas.  No fluctuance is noted.  No drainage is noted.  No erythema is noted.     Assessment:    Infected pilonidal cyst, resolved.  This is recurrent in nature.    Plan:   Patient will call to schedule excision of pilonidal cyst.  The risks and benefits of the procedure including bleeding, infection, and recurrence of the cyst were fully explained to the patient, who gave informed consent.  We will try a newer technique to core the 2 fistulous tracts out.

## 2019-11-22 NOTE — H&P (Signed)
Herbert Davis; [id number]; October 21, 1980   HPI Patient is a 39 year old white male who was referred to my care by Dr. Hurshel Party for evaluation treatment of a pilonidal cyst.  Patient was seen by him recently and noted to have an infected pilonidal cyst.  He was started on Bactrim for 1 week.  Patient states he is draining from 1 bulla around the pilonidal cyst region.  He does have pain over the coccyx.  The pain is made worse when sitting.  He rates the pain as 5 out of 10.  He has no fevers.  He states he has had inflammation around the coccyx in the past, but it resolved on its own.  He is never had the area incised. Past Medical History:  Diagnosis Date  . Prediabetes 08/28/2019  . Vitamin D deficiency disease 08/28/2019    Past Surgical History:  Procedure Laterality Date  . WISDOM TOOTH EXTRACTION      Family History  Problem Relation Age of Onset  . Healthy Mother   . Healthy Father   . Healthy Sister   . Healthy Brother   . Healthy Brother     Current Outpatient Medications on File Prior to Visit  Medication Sig Dispense Refill  . Cholecalciferol (VITAMIN D-3) 125 MCG (5000 UT) TABS Take 1 tablet by mouth daily.     No current facility-administered medications on file prior to visit.    No Known Allergies  Social History   Substance and Sexual Activity  Alcohol Use No    Social History   Tobacco Use  Smoking Status Never Smoker  Smokeless Tobacco Never Used    Review of Systems  Constitutional: Negative.   HENT: Negative.   Eyes: Negative.   Respiratory: Negative.   Cardiovascular: Negative.   Gastrointestinal: Negative.   Genitourinary: Negative.   Musculoskeletal: Negative.   Skin:       Boils  Neurological: Positive for dizziness.  Endo/Heme/Allergies: Negative.   Psychiatric/Behavioral: Negative.     Objective   Vitals:   10/19/19 1021  BP: 114/74  Pulse: 87  Resp: 16  Temp: 97.7 F (36.5 C)  SpO2: 96%    Physical Exam Vitals  reviewed.  Constitutional:      Appearance: Normal appearance. He is normal weight. He is not ill-appearing.  HENT:     Head: Normocephalic and atraumatic.  Cardiovascular:     Rate and Rhythm: Normal rate and regular rhythm.     Heart sounds: Normal heart sounds. No murmur. No friction rub. No gallop.   Pulmonary:     Effort: Pulmonary effort is normal. No respiratory distress.     Breath sounds: Normal breath sounds. No stridor. No wheezing, rhonchi or rales.  Skin:    Comments: 2 punctum's are present in the midline over the coccyx with subcutaneous sinuses extending to the left buttock cheek.  Granuloma with some drainage noted at the superior 1 with a bulla present inferiorly.  Both are smaller than 1 cm in size.  No significant erythema is noted.  Neurological:     Mental Status: He is alert and oriented to person, place, and time.     Assessment  Pilonidal cyst with abscess Plan   Patient will ultimately need excision of a pilonidal cyst.  Would like to treat with 1 more week of Bactrim as there is still inflammation present.  The extension of the sinuses makes this a more complicated pilonidal cyst to take care of.  The patient is scheduled  for excision of a pilonidal cyst on 12/11/2019.  The risks and benefits of the procedure including bleeding, infection, and recurrence of the cyst were fully explained to the patient, who gave informed consent.

## 2019-12-07 ENCOUNTER — Other Ambulatory Visit: Payer: Self-pay

## 2019-12-07 ENCOUNTER — Encounter (HOSPITAL_COMMUNITY)
Admission: RE | Admit: 2019-12-07 | Discharge: 2019-12-07 | Disposition: A | Discharge status: Home / Self Care | Payer: 59 | Source: Ambulatory Visit | Attending: General Surgery | Admitting: General Surgery

## 2019-12-07 ENCOUNTER — Encounter (HOSPITAL_COMMUNITY): Payer: Self-pay

## 2019-12-08 ENCOUNTER — Other Ambulatory Visit (HOSPITAL_COMMUNITY)
Admission: RE | Admit: 2019-12-08 | Discharge: 2019-12-08 | Disposition: A | Discharge status: Home / Self Care | Payer: 59 | Source: Ambulatory Visit | Attending: General Surgery | Admitting: General Surgery

## 2019-12-08 DIAGNOSIS — Z20822 Contact with and (suspected) exposure to covid-19: Secondary | ICD-10-CM | POA: Insufficient documentation

## 2019-12-08 DIAGNOSIS — Z01812 Encounter for preprocedural laboratory examination: Secondary | ICD-10-CM | POA: Diagnosis present

## 2019-12-09 LAB — SARS CORONAVIRUS 2 (TAT 6-24 HRS): SARS Coronavirus 2: NEGATIVE

## 2019-12-11 ENCOUNTER — Ambulatory Visit (HOSPITAL_COMMUNITY)
Admission: RE | Admit: 2019-12-11 | Discharge: 2019-12-11 | Disposition: A | Discharge status: Home / Self Care | Payer: 59 | Attending: General Surgery | Admitting: General Surgery

## 2019-12-11 ENCOUNTER — Ambulatory Visit (HOSPITAL_COMMUNITY): Payer: 59 | Admitting: Anesthesiology

## 2019-12-11 ENCOUNTER — Encounter (HOSPITAL_COMMUNITY)
Admission: RE | Disposition: A | Discharge status: Home / Self Care | Payer: Self-pay | Source: Home / Self Care | Attending: General Surgery

## 2019-12-11 ENCOUNTER — Encounter (HOSPITAL_COMMUNITY): Payer: Self-pay | Admitting: General Surgery

## 2019-12-11 DIAGNOSIS — L0501 Pilonidal cyst with abscess: Secondary | ICD-10-CM | POA: Diagnosis present

## 2019-12-11 DIAGNOSIS — L0591 Pilonidal cyst without abscess: Secondary | ICD-10-CM | POA: Diagnosis not present

## 2019-12-11 HISTORY — PX: PILONIDAL CYST EXCISION: SHX744

## 2019-12-11 SURGERY — EXCISION, PILONIDAL CYST, EXTENSIVE
Anesthesia: General

## 2019-12-11 MED ORDER — MIDAZOLAM HCL 2 MG/2ML IJ SOLN
INTRAMUSCULAR | Status: AC
  Filled 2019-12-11: qty 2

## 2019-12-11 MED ORDER — POVIDONE-IODINE 10 % EX OINT
TOPICAL_OINTMENT | CUTANEOUS | Status: AC
  Filled 2019-12-11: qty 1

## 2019-12-11 MED ORDER — PHENYLEPHRINE 40 MCG/ML (10ML) SYRINGE FOR IV PUSH (FOR BLOOD PRESSURE SUPPORT)
PREFILLED_SYRINGE | INTRAVENOUS | Status: AC
  Filled 2019-12-11: qty 10

## 2019-12-11 MED ORDER — LIDOCAINE HCL (CARDIAC) PF 50 MG/5ML IV SOSY
PREFILLED_SYRINGE | INTRAVENOUS | Status: DC | PRN
  Administered 2019-12-11: 60 mg via INTRAVENOUS

## 2019-12-11 MED ORDER — ONDANSETRON HCL 4 MG/2ML IJ SOLN
INTRAMUSCULAR | Status: AC
  Filled 2019-12-11: qty 2

## 2019-12-11 MED ORDER — OXYCODONE-ACETAMINOPHEN 7.5-325 MG PO TABS: 1.0000 | ORAL_TABLET | Freq: Four times a day (QID) | ORAL | 0 refills | Status: DC | PRN

## 2019-12-11 MED ORDER — BUPIVACAINE HCL (PF) 0.5 % IJ SOLN
INTRAMUSCULAR | Status: DC | PRN
  Administered 2019-12-11: 9 mL

## 2019-12-11 MED ORDER — 0.9 % SODIUM CHLORIDE (POUR BTL) OPTIME
TOPICAL | Status: DC | PRN
  Administered 2019-12-11: 1000 mL

## 2019-12-11 MED ORDER — FENTANYL CITRATE (PF) 100 MCG/2ML IJ SOLN: 25.0000 ug | INTRAMUSCULAR | Status: DC | PRN

## 2019-12-11 MED ORDER — ONDANSETRON HCL 4 MG/2ML IJ SOLN
INTRAMUSCULAR | Status: DC | PRN
  Administered 2019-12-11: 4 mg via INTRAVENOUS

## 2019-12-11 MED ORDER — CHLORHEXIDINE GLUCONATE CLOTH 2 % EX PADS: 6.0000 | MEDICATED_PAD | Freq: Once | CUTANEOUS | Status: DC

## 2019-12-11 MED ORDER — PROPOFOL 10 MG/ML IV BOLUS
INTRAVENOUS | Status: DC | PRN
  Administered 2019-12-11: 200 mg via INTRAVENOUS

## 2019-12-11 MED ORDER — PHENYLEPHRINE HCL (PRESSORS) 10 MG/ML IV SOLN
INTRAVENOUS | Status: DC | PRN
  Administered 2019-12-11: 60 ug via INTRAVENOUS

## 2019-12-11 MED ORDER — BUPIVACAINE HCL (PF) 0.5 % IJ SOLN
INTRAMUSCULAR | Status: AC
  Filled 2019-12-11: qty 30

## 2019-12-11 MED ORDER — FENTANYL CITRATE (PF) 100 MCG/2ML IJ SOLN
INTRAMUSCULAR | Status: DC | PRN
  Administered 2019-12-11: 50 ug via INTRAVENOUS
  Administered 2019-12-11 (×2): 25 ug via INTRAVENOUS

## 2019-12-11 MED ORDER — LIDOCAINE 2% (20 MG/ML) 5 ML SYRINGE
INTRAMUSCULAR | Status: AC
  Filled 2019-12-11: qty 5

## 2019-12-11 MED ORDER — HYDROGEN PEROXIDE 3 % EX SOLN
CUTANEOUS | Status: DC | PRN
  Administered 2019-12-11: 1

## 2019-12-11 MED ORDER — KETOROLAC TROMETHAMINE 30 MG/ML IJ SOLN
INTRAMUSCULAR | Status: AC
  Filled 2019-12-11: qty 1

## 2019-12-11 MED ORDER — PROPOFOL 10 MG/ML IV BOLUS
INTRAVENOUS | Status: AC
  Filled 2019-12-11: qty 40

## 2019-12-11 MED ORDER — KETOROLAC TROMETHAMINE 30 MG/ML IJ SOLN
30.0000 mg | Freq: Once | INTRAMUSCULAR | Status: AC
  Administered 2019-12-11: 30 mg via INTRAVENOUS

## 2019-12-11 MED ORDER — LACTATED RINGERS IV SOLN: INTRAVENOUS | Status: DC | PRN

## 2019-12-11 MED ORDER — MIDAZOLAM HCL 5 MG/5ML IJ SOLN
INTRAMUSCULAR | Status: DC | PRN
  Administered 2019-12-11: 2 mg via INTRAVENOUS

## 2019-12-11 MED ORDER — POVIDONE-IODINE 10 % OINT PACKET
TOPICAL_OINTMENT | CUTANEOUS | Status: DC | PRN
  Administered 2019-12-11: 1 via TOPICAL

## 2019-12-11 MED ORDER — FENTANYL CITRATE (PF) 250 MCG/5ML IJ SOLN
INTRAMUSCULAR | Status: AC
  Filled 2019-12-11: qty 5

## 2019-12-11 MED ORDER — CEFAZOLIN SODIUM-DEXTROSE 2-4 GM/100ML-% IV SOLN
2.0000 g | INTRAVENOUS | Status: AC
  Administered 2019-12-11: 2 g via INTRAVENOUS
  Filled 2019-12-11: qty 100

## 2019-12-11 MED ORDER — LACTATED RINGERS IV SOLN
INTRAVENOUS | Status: DC
  Administered 2019-12-11: 1000 mL via INTRAVENOUS

## 2019-12-11 SURGICAL SUPPLY — 38 items
BLADE SURG SZ11 CARB STEEL (BLADE) ×2 IMPLANT
CANNULA VESSEL 3MM 2 BLNT TIP (CANNULA) ×2 IMPLANT
CLOTH BEACON ORANGE TIMEOUT ST (SAFETY) ×2 IMPLANT
COVER LIGHT HANDLE STERIS (MISCELLANEOUS) ×4 IMPLANT
COVER WAND RF STERILE (DRAPES) ×2 IMPLANT
DECANTER SPIKE VIAL GLASS SM (MISCELLANEOUS) ×2 IMPLANT
DRSG XEROFORM 1X8 (GAUZE/BANDAGES/DRESSINGS) ×2 IMPLANT
ELECT REM PT RETURN 9FT ADLT (ELECTROSURGICAL) ×2
ELECTRODE REM PT RTRN 9FT ADLT (ELECTROSURGICAL) ×1 IMPLANT
GAUZE SPONGE 4X4 12PLY STRL (GAUZE/BANDAGES/DRESSINGS) ×2 IMPLANT
GAUZE XEROFORM 5X9 LF (GAUZE/BANDAGES/DRESSINGS) IMPLANT
GLOVE BIO SURGEON STRL SZ 6.5 (GLOVE) ×2 IMPLANT
GLOVE BIO SURGEON STRL SZ7 (GLOVE) ×2 IMPLANT
GLOVE BIOGEL PI IND STRL 6.5 (GLOVE) ×1 IMPLANT
GLOVE BIOGEL PI IND STRL 7.0 (GLOVE) ×2 IMPLANT
GLOVE BIOGEL PI IND STRL 8 (GLOVE) ×1 IMPLANT
GLOVE BIOGEL PI INDICATOR 6.5 (GLOVE) ×1
GLOVE BIOGEL PI INDICATOR 7.0 (GLOVE) ×2
GLOVE BIOGEL PI INDICATOR 8 (GLOVE) ×1
GLOVE SURG SS PI 7.5 STRL IVOR (GLOVE) ×2 IMPLANT
GOWN STRL REUS W/ TWL LRG LVL3 (GOWN DISPOSABLE) ×1 IMPLANT
GOWN STRL REUS W/TWL LRG LVL3 (GOWN DISPOSABLE) ×6 IMPLANT
KIT TURNOVER KIT A (KITS) ×2 IMPLANT
MANIFOLD NEPTUNE II (INSTRUMENTS) ×2 IMPLANT
NEEDLE HYPO 18GX1.5 BLUNT FILL (NEEDLE) ×2 IMPLANT
NS IRRIG 1000ML POUR BTL (IV SOLUTION) ×2 IMPLANT
PACK MINOR (CUSTOM PROCEDURE TRAY) ×2 IMPLANT
PAD ABD 5X9 TENDERSORB (GAUZE/BANDAGES/DRESSINGS) ×2 IMPLANT
PAD ARMBOARD 7.5X6 YLW CONV (MISCELLANEOUS) ×2 IMPLANT
SET BASIN LINEN APH (SET/KITS/TRAYS/PACK) ×2 IMPLANT
SIGMOIDOSCOPE DISPOSABLE (MISCELLANEOUS) ×2 IMPLANT
SOL PREP PROV IODINE SCRUB 4OZ (MISCELLANEOUS) ×2 IMPLANT
SPONGE LAP 18X18 RF (DISPOSABLE) ×2 IMPLANT
SUCT SIGMOIDOSCOPE TIP 18 W/TU (SUCTIONS) ×2 IMPLANT
SUT PROLENE 3 0 PS 2 (SUTURE) ×4 IMPLANT
SYR 30ML LL (SYRINGE) ×2 IMPLANT
SYR CONTROL 10ML LL (SYRINGE) ×2 IMPLANT
TAPE CLOTH SURG 4X10 WHT LF (GAUZE/BANDAGES/DRESSINGS) ×2 IMPLANT

## 2019-12-11 NOTE — Anesthesia Procedure Notes (Signed)
Procedure Name: LMA Insertion Date/Time: 12/11/2019 7:34 AM Performed by: Moshe Salisbury, CRNA Pre-anesthesia Checklist: Patient identified, Patient being monitored, Emergency Drugs available, Timeout performed and Suction available Patient Re-evaluated:Patient Re-evaluated prior to induction Oxygen Delivery Method: Circle System Utilized Preoxygenation: Pre-oxygenation with 100% oxygen Induction Type: IV induction Ventilation: Mask ventilation without difficulty LMA: LMA inserted LMA Size: 4.0 Number of attempts: 1 Placement Confirmation: positive ETCO2 and breath sounds checked- equal and bilateral

## 2019-12-11 NOTE — Anesthesia Preprocedure Evaluation (Addendum)
Anesthesia Evaluation  Patient identified by MRN, date of birth, ID band Patient awake    Reviewed: Allergy & Precautions, H&P , NPO status , Patient's Chart, lab work & pertinent test results, reviewed documented beta blocker date and time   Airway Mallampati: II  TM Distance: >3 FB Neck ROM: full    Dental no notable dental hx.    Pulmonary neg pulmonary ROS,    Pulmonary exam normal        Cardiovascular negative cardio ROS Normal cardiovascular exam     Neuro/Psych negative neurological ROS  negative psych ROS   GI/Hepatic negative GI ROS, Neg liver ROS,   Endo/Other  negative endocrine ROS  Renal/GU negative Renal ROS  negative genitourinary   Musculoskeletal   Abdominal   Peds  Hematology negative hematology ROS (+)   Anesthesia Other Findings   Reproductive/Obstetrics negative OB ROS                             Anesthesia Physical Anesthesia Plan  ASA: II  Anesthesia Plan: General   Post-op Pain Management:    Induction:   PONV Risk Score and Plan: 2 and Ondansetron  Airway Management Planned:   Additional Equipment:   Intra-op Plan:   Post-operative Plan:   Informed Consent: I have reviewed the patients History and Physical, chart, labs and discussed the procedure including the risks, benefits and alternatives for the proposed anesthesia with the patient or authorized representative who has indicated his/her understanding and acceptance.       Plan Discussed with: CRNA  Anesthesia Plan Comments:         Anesthesia Quick Evaluation

## 2019-12-11 NOTE — Anesthesia Postprocedure Evaluation (Signed)
Anesthesia Post Note  Patient: Herbert Davis  Procedure(s) Performed: CYST EXCISION PILONIDAL, SIMPLE (N/A )  Anesthesia Type: General     Last Vitals:  Vitals:   12/11/19 0915 12/11/19 0922  BP: 107/73 119/82  Pulse: 74 84  Resp: 13 18  Temp:  36.6 C  SpO2: 94% 94%    Last Pain:  Vitals:   12/11/19 0922  TempSrc: Oral  PainSc: 5                  Windell Norfolk

## 2019-12-11 NOTE — Interval H&P Note (Signed)
History and Physical Interval Note:  12/11/2019 7:14 AM  Herbert Davis  has presented today for surgery, with the diagnosis of Pilondial cyst.  The various methods of treatment have been discussed with the patient and family. After consideration of risks, benefits and other options for treatment, the patient has consented to  Procedure(s): CYST EXCISION PILONIDAL, SIMPLE (N/A) as a surgical intervention.  The patient's history has been reviewed, patient examined, no change in status, stable for surgery.  I have reviewed the patient's chart and labs.  Questions were answered to the patient's satisfaction.     Franky Macho

## 2019-12-11 NOTE — Transfer of Care (Signed)
Immediate Anesthesia Transfer of Care Note  Patient: Herbert Davis  Procedure(s) Performed: CYST EXCISION PILONIDAL, SIMPLE (N/A )  Patient Location: PACU  Anesthesia Type:General  Level of Consciousness: awake  Airway & Oxygen Therapy: Patient Spontanous Breathing  Post-op Assessment: Report given to RN  Post vital signs: Reviewed and stable  Last Vitals:  Vitals Value Taken Time  BP 105/73 12/11/19 0830  Temp    Pulse 83 12/11/19 0830  Resp 18 12/11/19 0830  SpO2 97 % 12/11/19 0830  Vitals shown include unvalidated device data.  Last Pain:  Vitals:   12/11/19 0643  TempSrc: Oral  PainSc: 0-No pain      Patients Stated Pain Goal: 6 (12/11/19 9980)  Complications: No apparent anesthesia complications

## 2019-12-11 NOTE — Op Note (Signed)
Patient:  Herbert Davis  DOB:  [birth year]  MRN:  [id number]   Preop Diagnosis: Recurrent pilonidal cyst  Postop Diagnosis: Same  Procedure: Excision of pilonidal cyst, complicated  Surgeon: Franky Macho, MD  Anes: General  Indications: Patient is a 39 year old white male who has had issues with pilonidal cyst for many years.  He now presents for excision of 2 sinuses from the pilonidal cyst region.  The risks and benefits of the procedure including bleeding, infection, wound breakdown, the possibility of recurrence of the pilonidal cyst were fully explained to the patient, who gave informed consent.  Procedure note: The patient was placed in the right lateral decubitus position after general anesthesia was administered.  The coccyx region was prepped and draped using the usual sterile technique with Betadine.  Surgical site confirmation was performed.  The patient had 2 areas of increased subcutaneous tissue and healed sinuses that were just to the left of the midline.  The more superior 1 was first addressed.  Given the large three quarters of a centimeter area of thin skin, I did an elliptical incision down to the subcutaneous tissue.  Granulation tissue was found.  This did track inferiorly towards the midline.  There was no open wound at the midline.  A curette was used to remove any of the granulation tissue.  The sinus tract was irrigated with peroxide.  A bleeding was controlled using Bovie electrocautery.  The skin edges were reapproximated using a 3-0 Prolene vertical mattress suture, though an opening was present in the midportion to allow drainage.  Next, a smaller healed sinus tract was noted inferior to the first 1.  Again, it was along the left side of the coccyx region.  The skin biopsy probe was then used to excise the skin in that region.  It also had a sinus tract that went towards midline, but no opening in the midline was present.  Any granulation tissue was curetted and  removed.  It was irrigated with hydrogen peroxide.  A bleeding was controlled using Bovie electrocautery.  The wound was left open.  0.5% Marcaine was instilled into the surrounding wounds.  Betadine ointment and Xeroform were placed over both regions.  A dry sterile dressing was applied.  All tape and needle counts were correct at the end of the procedure.  The patient was awakened and transferred to PACU in stable condition.  Complications: None  EBL: Minimal  Specimen: None

## 2019-12-12 ENCOUNTER — Other Ambulatory Visit (INDEPENDENT_AMBULATORY_CARE_PROVIDER_SITE_OTHER): Payer: Self-pay | Admitting: General Surgery

## 2019-12-12 ENCOUNTER — Telehealth: Payer: Self-pay

## 2019-12-12 DIAGNOSIS — Z09 Encounter for follow-up examination after completed treatment for conditions other than malignant neoplasm: Secondary | ICD-10-CM

## 2019-12-12 MED ORDER — HYDROCODONE-ACETAMINOPHEN 5-325 MG PO TABS: 1.0000 | ORAL_TABLET | ORAL | 0 refills | Status: DC | PRN

## 2019-12-12 NOTE — Telephone Encounter (Signed)
Patient had Pilonidal cyst excision yesterday-prescribed Oxycodone- he has taken it 2 different times for pain and has vomited each time- he is requesting something else.

## 2019-12-14 ENCOUNTER — Other Ambulatory Visit: Payer: Self-pay

## 2019-12-14 ENCOUNTER — Encounter: Payer: Self-pay | Admitting: General Surgery

## 2019-12-14 ENCOUNTER — Ambulatory Visit (INDEPENDENT_AMBULATORY_CARE_PROVIDER_SITE_OTHER): Payer: Self-pay | Admitting: General Surgery

## 2019-12-14 VITALS — BP 109/69 | HR 84 | Temp 97.9°F | Resp 14 | Ht 70.0 in | Wt 216.0 lb

## 2019-12-14 DIAGNOSIS — Z09 Encounter for follow-up examination after completed treatment for conditions other than malignant neoplasm: Secondary | ICD-10-CM

## 2019-12-14 NOTE — Progress Notes (Signed)
Subjective:     Herbert Davis  Postoperative visit, status post excision of pilonidal cyst.  Patient is having moderate pain at the incision sites.  No significant drainage noted.  Pain medication was switched from oxycodone to hydrocodone due to nausea.  The hydrocodone is working well. Objective:    BP 109/69   Pulse 84   Temp 97.9 F (36.6 C) (Oral)   Resp 14   Ht 5\' 10"  (1.778 m)   Wt 216 lb (98 kg)   SpO2 94%   BMI 30.99 kg/m   General:  alert, cooperative and no distress  Pilonidal surgical incision is noted.  No erythema is noted.     Assessment:    Doing well postoperatively.    Plan:   Follow-up in 1 week for wound check.

## 2019-12-21 ENCOUNTER — Encounter: Payer: Self-pay | Admitting: General Surgery

## 2019-12-21 ENCOUNTER — Ambulatory Visit (INDEPENDENT_AMBULATORY_CARE_PROVIDER_SITE_OTHER): Payer: Self-pay | Admitting: General Surgery

## 2019-12-21 ENCOUNTER — Other Ambulatory Visit: Payer: Self-pay

## 2019-12-21 VITALS — BP 110/68 | HR 86 | Temp 97.7°F | Resp 12 | Ht 71.0 in | Wt 214.0 lb

## 2019-12-21 DIAGNOSIS — Z09 Encounter for follow-up examination after completed treatment for conditions other than malignant neoplasm: Secondary | ICD-10-CM

## 2019-12-21 NOTE — Progress Notes (Signed)
Subjective:     Herbert Davis  Here for postoperative wound check.  Patient states the pain has decreased significantly since I last saw him.  No significant drainage noted. Objective:    BP 110/68   Pulse 86   Temp 97.7 F (36.5 C) (Oral)   Resp 12   Ht 5\' 11"  (1.803 m)   Wt 214 lb (97.1 kg)   SpO2 97%   BMI 29.85 kg/m   General:  alert, cooperative and no distress  Both incision sites over the coccyx region are healing well.  Sutures removed from the upper wound.     Assessment:    Doing well postoperatively.    Plan:   Follow-up here as needed.  Patient does realize that he could have a recurrence.

## 2020-01-29 ENCOUNTER — Other Ambulatory Visit: Payer: Self-pay

## 2020-01-29 ENCOUNTER — Encounter (INDEPENDENT_AMBULATORY_CARE_PROVIDER_SITE_OTHER): Payer: Self-pay | Admitting: Nurse Practitioner

## 2020-01-29 ENCOUNTER — Ambulatory Visit (INDEPENDENT_AMBULATORY_CARE_PROVIDER_SITE_OTHER): Payer: 59 | Admitting: Nurse Practitioner

## 2020-01-29 VITALS — BP 115/75 | HR 93 | Temp 98.0°F | Ht 71.0 in | Wt 220.0 lb

## 2020-01-29 DIAGNOSIS — E559 Vitamin D deficiency, unspecified: Secondary | ICD-10-CM

## 2020-01-29 DIAGNOSIS — R7303 Prediabetes: Secondary | ICD-10-CM | POA: Diagnosis not present

## 2020-01-29 DIAGNOSIS — L0591 Pilonidal cyst without abscess: Secondary | ICD-10-CM | POA: Diagnosis not present

## 2020-01-29 NOTE — Progress Notes (Signed)
Subjective:  Patient ID: Herbert Davis, male    DOB: [birth year]  Age: 39 y.o. MRN: [id number]  CC:  Chief Complaint  Patient presents with  . Follow-up    Vitamin D Deficiency, Prediabetes      HPI  This patient arrives today for follow-up for the above.  Vitamin D deficiency: Last serum level was collected in December 2020 it was 75.  At that time he was on 5000 IUs of vitamin D3 daily.  He tells me he continues to take this supplement.  Prediabetes: He has a history of prediabetes.  Last A1c was collected in December 2020 and it was in the normal range at 5.5.  Pilonidal cyst: At his last office visit with me it was determined that he had a pilondial cyst, he was referred to general surgery.  He tells me since then he has undergone surgery to have this removed and everything has healed well.  He is not having any further issues.   Past Medical History:  Diagnosis Date  . Prediabetes 08/28/2019  . Vitamin D deficiency disease 08/28/2019      Family History  Problem Relation Age of Onset  . Healthy Mother   . Healthy Father   . Healthy Sister   . Healthy Brother   . Healthy Brother     Social History   Social History Narrative   Lives with parents .Master's degree in divinity. Unemployed. Single.   Social History   Tobacco Use  . Smoking status: Never Smoker  . Smokeless tobacco: Never Used  Substance Use Topics  . Alcohol use: No     Current Meds  Medication Sig  . Cholecalciferol (VITAMIN D-3) 125 MCG (5000 UT) TABS Take 5,000 Units by mouth daily.     ROS:  Review of Systems  Constitutional: Negative for fever, malaise/fatigue and weight loss.  Eyes: Negative for blurred vision and double vision.  Respiratory: Negative for cough, shortness of breath and wheezing.   Cardiovascular: Negative for chest pain and palpitations.  Neurological: Negative for dizziness and headaches.     Objective:   Today's Vitals: BP 115/75 (BP Location: Left  Arm, Patient Position: Sitting, Cuff Size: Normal)   Pulse 93   Temp 98 F (36.7 C) (Temporal)   Ht '5\' 11"'$  (1.803 m)   Wt 220 lb (99.8 kg)   SpO2 96%   BMI 30.68 kg/m  Vitals with BMI 01/29/2020 12/21/2019 12/14/2019  Height '5\' 11"'$  '5\' 11"'$  '5\' 10"'$   Weight 220 lbs 214 lbs 216 lbs  BMI 30.7 29.92 42.68  Systolic 341 962 229  Diastolic 75 68 69  Pulse 93 86 84     Physical Exam Vitals reviewed.  Constitutional:      Appearance: Normal appearance.  HENT:     Head: Normocephalic and atraumatic.  Cardiovascular:     Rate and Rhythm: Normal rate and regular rhythm.  Pulmonary:     Effort: Pulmonary effort is normal.     Breath sounds: Normal breath sounds.  Musculoskeletal:     Cervical back: Neck supple.  Skin:    General: Skin is warm and dry.  Neurological:     Mental Status: He is alert and oriented to person, place, and time.  Psychiatric:        Mood and Affect: Mood normal.        Behavior: Behavior normal.        Thought Content: Thought content normal.  Judgment: Judgment normal.          Assessment and Plan   1. Vitamin D deficiency   2. Prediabetes   3. Pilonidal cyst      Plan: 1.,  2.  We will collect blood work today for further evaluation.  He will continue on his current vitamin D3 dose as prescribed.  3.  This seems to have resolved, no further work-up necessary at this time.   Tests ordered Orders Placed This Encounter  Procedures  . Vitamin D, 25-hydroxy  . CMP with eGFR([facility])  . Hemoglobin A1c      No orders of the defined types were placed in this encounter.   Patient to follow-up in 6 months  Ailene Ards, NP

## 2020-01-30 LAB — COMPLETE METABOLIC PANEL WITH GFR
AG Ratio: 1.7 (calc) (ref 1.0–2.5)
ALT: 21 U/L (ref 9–46)
AST: 17 U/L (ref 10–40)
Albumin: 4.3 g/dL (ref 3.6–5.1)
Alkaline phosphatase (APISO): 68 U/L (ref 36–130)
BUN: 16 mg/dL (ref 7–25)
CO2: 28 mmol/L (ref 20–32)
Calcium: 10.1 mg/dL (ref 8.6–10.3)
Chloride: 102 mmol/L (ref 98–110)
Creat: 1.14 mg/dL (ref 0.60–1.35)
GFR, Est African American: 93 mL/min/{1.73_m2} (ref >=60)
GFR, Est Non African American: 81 mL/min/{1.73_m2} (ref >=60)
Globulin: 2.6 g/dL (calc) (ref 1.9–3.7)
Glucose, Bld: 79 mg/dL (ref 65–99)
Potassium: 4.2 mmol/L (ref 3.5–5.3)
Sodium: 137 mmol/L (ref 135–146)
Total Bilirubin: 0.4 mg/dL (ref 0.2–1.2)
Total Protein: 6.9 g/dL (ref 6.1–8.1)

## 2020-01-30 LAB — VITAMIN D 25 HYDROXY (VIT D DEFICIENCY, FRACTURES): Vit D, 25-Hydroxy: 63 ng/mL (ref 30–100)

## 2020-01-30 LAB — HEMOGLOBIN A1C
Hgb A1c MFr Bld: 5.3 % of total Hgb (ref <5.7)
Mean Plasma Glucose: 105 (calc)
eAG (mmol/L): 5.8 (calc)

## 2020-05-31 ENCOUNTER — Emergency Department (HOSPITAL_COMMUNITY)
Admission: EM | Admit: 2020-05-31 | Discharge: 2020-05-31 | Disposition: A | Discharge status: Home / Self Care | Payer: 59 | Attending: Emergency Medicine | Admitting: Emergency Medicine

## 2020-05-31 ENCOUNTER — Emergency Department (HOSPITAL_COMMUNITY): Payer: 59

## 2020-05-31 ENCOUNTER — Other Ambulatory Visit: Payer: Self-pay

## 2020-05-31 ENCOUNTER — Encounter (HOSPITAL_COMMUNITY): Payer: Self-pay | Admitting: Emergency Medicine

## 2020-05-31 DIAGNOSIS — R079 Chest pain, unspecified: Secondary | ICD-10-CM | POA: Insufficient documentation

## 2020-05-31 LAB — BASIC METABOLIC PANEL
Anion gap: 9 (ref 5–15)
BUN: 12 mg/dL (ref 6–20)
CO2: 24 mmol/L (ref 22–32)
Calcium: 9.4 mg/dL (ref 8.9–10.3)
Chloride: 104 mmol/L (ref 98–111)
Creatinine, Ser: 1.12 mg/dL (ref 0.61–1.24)
GFR calc Af Amer: >60 mL/min (ref >60)
GFR calc non Af Amer: >60 mL/min (ref >60)
Glucose, Bld: 100 mg/dL — ABNORMAL HIGH (ref 70–99)
Potassium: 3.6 mmol/L (ref 3.5–5.1)
Sodium: 137 mmol/L (ref 135–145)

## 2020-05-31 LAB — CBC
HCT: 45.5 % (ref 39.0–52.0)
Hemoglobin: 15 g/dL (ref 13.0–17.0)
MCH: 27.9 pg (ref 26.0–34.0)
MCHC: 33 g/dL (ref 30.0–36.0)
MCV: 84.6 fL (ref 80.0–100.0)
Platelets: 345 10*3/uL (ref 150–400)
RBC: 5.38 MIL/uL (ref 4.22–5.81)
RDW: 12.9 % (ref 11.5–15.5)
WBC: 8 10*3/uL (ref 4.0–10.5)
nRBC: 0 % (ref 0.0–0.2)

## 2020-05-31 LAB — TROPONIN I (HIGH SENSITIVITY): Troponin I (High Sensitivity): 3 ng/L (ref <18)

## 2020-05-31 MED ORDER — KETOROLAC TROMETHAMINE 30 MG/ML IJ SOLN
30.0000 mg | Freq: Once | INTRAMUSCULAR | Status: AC
  Administered 2020-05-31: 30 mg via INTRAVENOUS
  Filled 2020-05-31: qty 1

## 2020-05-31 NOTE — ED Triage Notes (Signed)
Pt c/o chest pain that radiates down left arm.

## 2020-05-31 NOTE — Discharge Instructions (Addendum)
Take ibuprofen 600 mg every 6 hours as needed for pain.  Follow-up with cardiology in the next 3 to 4 days.  The contact information for the cardiology clinic in [place] has been provided in this discharge summary for you to call and make these arrangements.  Return to the emergency department for worsening chest pain, difficulty breathing, or other new and concerning symptoms.

## 2020-05-31 NOTE — ED Provider Notes (Signed)
[facility] EMERGENCY DEPARTMENT Provider Note   CSN: [id number] Arrival date & time: 05/31/20  3810     History Chief Complaint  Patient presents with  . Chest Pain    Herbert Davis is a 39 y.o. male.  Patient is a 39 year old male with no significant past medical history.  He presents today for evaluation of chest discomfort.  This started approximately 5:00 this evening while he was driving.  Patient describes a pressure to the left lateral aspect of his chest that becomes sharp at times.  It has been constant since its onset.  He feels numbness to the left arm but denies any shortness of breath, nausea, or diaphoresis.  He has no prior cardiac history and no cardiac risk factors.  He denies any recent exertional symptoms, but admits that he has not been exercising regularly.  The history is provided by the patient.  Chest Pain Pain location:  L chest and substernal area Pain quality: pressure   Pain radiates to:  L arm Pain severity:  Moderate Onset quality:  Sudden Timing:  Constant Progression:  Unchanged Chronicity:  New Relieved by:  Nothing Worsened by:  Nothing Ineffective treatments:  None tried      Past Medical History:  Diagnosis Date  . Pilonidal cyst   . Prediabetes 08/28/2019  . Vitamin D deficiency disease 08/28/2019    Patient Active Problem List   Diagnosis Date Noted  . Vitamin D deficiency disease 08/28/2019  . Prediabetes 08/28/2019    Past Surgical History:  Procedure Laterality Date  . PILONIDAL CYST EXCISION N/A 12/11/2019   Procedure: CYST EXCISION PILONIDAL;  Surgeon: Franky Macho, MD;  Location: [facility];  Service: General;  Laterality: N/A;  . WISDOM TOOTH EXTRACTION         Family History  Problem Relation Age of Onset  . Healthy Mother   . Healthy Father   . Healthy Sister   . Healthy Brother   . Healthy Brother     Social History   Tobacco Use  . Smoking status: Never Smoker  . Smokeless tobacco: Never Used  Vaping  Use  . Vaping Use: Never used  Substance Use Topics  . Alcohol use: No  . Drug use: No    Home Medications Prior to Admission medications   Medication Sig Start Date End Date Taking? Authorizing Provider  Cholecalciferol (VITAMIN D-3) 125 MCG (5000 UT) TABS Take 5,000 Units by mouth daily.     [provider]    Allergies    Patient has no known allergies.  Review of Systems   Review of Systems  Cardiovascular: Positive for chest pain.  All other systems reviewed and are negative.   Physical Exam Updated Vital Signs BP (!) 146/95 (BP Location: Right Arm)   Pulse (!) 102   Temp 98.8 F (37.1 C) (Oral)   Resp 17   Ht 5\' 11"  (1.803 m)   Wt 95.3 kg   SpO2 95%   BMI 29.29 kg/m   Physical Exam Vitals and nursing note reviewed.  Constitutional:      General: He is not in acute distress.    Appearance: He is well-developed. He is not diaphoretic.  HENT:     Head: Normocephalic and atraumatic.  Cardiovascular:     Rate and Rhythm: Normal rate and regular rhythm.     Heart sounds: No murmur heard.  No friction rub.  Pulmonary:     Effort: Pulmonary effort is normal. No respiratory distress.  Breath sounds: Normal breath sounds. No wheezing or rales.  Abdominal:     General: Bowel sounds are normal. There is no distension.     Palpations: Abdomen is soft.     Tenderness: There is no abdominal tenderness.  Musculoskeletal:        General: Normal range of motion.     Cervical back: Normal range of motion and neck supple.     Right lower leg: No tenderness. No edema.     Left lower leg: No tenderness. No edema.  Skin:    General: Skin is warm and dry.  Neurological:     Mental Status: He is alert and oriented to person, place, and time.     Coordination: Coordination normal.     ED Results / Procedures / Treatments   Labs (all labs ordered are listed, but only abnormal results are displayed) Labs Reviewed  BASIC METABOLIC PANEL  CBC  TROPONIN I  (HIGH SENSITIVITY)    EKG     Radiology DG Chest 2 View  Result Date: 05/31/2020 CLINICAL DATA:  Chest pain EXAM: CHEST - 2 VIEW COMPARISON:  06/14/2010 FINDINGS: Small hiatal hernia. Heart and mediastinal contours are within normal limits. No focal opacities or effusions. No acute bony abnormality. IMPRESSION: No active cardiopulmonary disease. Electronically Signed   By: Charlett Nose M.D.   On: 05/31/2020 01:00    Procedures Procedures (including critical care time)  Medications Ordered in ED Medications - No data to display  ED Course  I have reviewed the triage vital signs and the nursing notes.  Pertinent labs & imaging results that were available during my care of the patient were reviewed by me and considered in my medical decision making (see chart for details).    MDM Rules/Calculators/A&P  Patient with no cardiac history, no cardiac risk factors presenting with complaints of chest discomfort that began at 5 PM while driving.  His symptoms are somewhat atypical for heart pain.  He describes a pressure to the left side of his chest along with tingling to the left arm.  His vitals are stable.  Physical examination unremarkable, but does have reproducible tenderness to the left chest.  His work-up today shows a normal EKG and troponin is negative after 8 hours of symptoms.  He is feeling better after receiving Toradol here in the ER.  At this point, I feel as though discharge is appropriate.  I will recommend he follow-up with the cardiology department to discuss the symptoms and see if a stress test is warranted, but see no indication for inpatient hospitalization.  Patient understands to return if his symptoms worsen or change.  Final Clinical Impression(s) / ED Diagnoses Final diagnoses:  None    Rx / DC Orders ED Discharge Orders    None       Geoffery Lyons, MD 05/31/20 2263

## 2020-07-25 ENCOUNTER — Encounter: Payer: Self-pay | Admitting: Cardiology

## 2020-07-25 ENCOUNTER — Ambulatory Visit (INDEPENDENT_AMBULATORY_CARE_PROVIDER_SITE_OTHER): Payer: 59 | Admitting: Cardiology

## 2020-07-25 ENCOUNTER — Encounter: Payer: Self-pay | Admitting: *Deleted

## 2020-07-25 ENCOUNTER — Telehealth: Payer: Self-pay | Admitting: Cardiology

## 2020-07-25 VITALS — BP 124/82 | HR 86 | Ht 71.0 in | Wt 218.4 lb

## 2020-07-25 DIAGNOSIS — R7303 Prediabetes: Secondary | ICD-10-CM | POA: Diagnosis not present

## 2020-07-25 DIAGNOSIS — R0789 Other chest pain: Secondary | ICD-10-CM | POA: Diagnosis not present

## 2020-07-25 DIAGNOSIS — Z87898 Personal history of other specified conditions: Secondary | ICD-10-CM | POA: Diagnosis not present

## 2020-07-25 NOTE — Patient Instructions (Signed)
Medication Instructions:   Your physician recommends that you continue on your current medications as directed. Please refer to the Current Medication list given to you today.  Labwork:  None  Testing/Procedures: Your physician has requested that you have an exercise tolerance test. For further information please visit [email or web address] Please also follow instruction sheet, as given. Your physician has requested that you have an echocardiogram. Echocardiography is a painless test that uses sound waves to create images of your heart. It provides your doctor with information about the size and shape of your heart and how well your heart's chambers and valves are working. This procedure takes approximately one hour. There are no restrictions for this procedure.  Follow-Up:  Pending  Any Other Special Instructions Will Be Listed Below (If Applicable).  If you need a refill on your cardiac medications before your next appointment, please call your pharmacy.

## 2020-07-25 NOTE — Telephone Encounter (Signed)
Pre-cert Verification for the following procedure    GXT scheduled for 07-30-2020 @ [facility]   Echo scheduled for 08-02-2020 @ [facility]    :

## 2020-07-25 NOTE — Progress Notes (Signed)
Cardiology Office Note  Date: 07/25/2020   ID: Herbert Davis, DOB [birth year], MRN [id number]  PCP:  Wilson Singer, MD  Cardiologist:  Nona Dell, MD Electrophysiologist:  None   Chief Complaint  Patient presents with  . Chest Pain    History of Present Illness: Herbert Davis is a 39 y.o. male referred for cardiology consultation by Dr. Judd Lien after ER visit at [facility] in September with chest discomfort.  ECG showed sinus tachycardia without acute ST segment changes and high-sensitivity troponin I levels were normal.  Chest x-ray reported no acute findings.  Pain reportedly improved after Toradol.  We discussed the symptoms today.  He states the event that took him to the emergency room was a "sharp" fairly intense left-sided chest discomfort, he could not sleep that night, was worse associated with movement and also breathing.  No definite cough, fevers or chills.  He tells me that the symptoms recurred for 4 days after his ER visit but to less degree, still recur now but less frequently.  Symptoms are not necessarily exertional in nature.  No definite sense of orthopnea or PND.  Main cardiac risk factor at this point is history of prediabetes which she is managing via diet.  No family history of premature CAD.  He currently works as a Research scientist (medical).  Past Medical History:  Diagnosis Date  . Pilonidal cyst   . Prediabetes 08/28/2019  . Vitamin D deficiency disease 08/28/2019    Past Surgical History:  Procedure Laterality Date  . PILONIDAL CYST EXCISION N/A 12/11/2019   Procedure: CYST EXCISION PILONIDAL;  Davis: Franky Macho, MD;  Location: [facility];  Service: General;  Laterality: N/A;  . WISDOM TOOTH EXTRACTION      Current Outpatient Medications  Medication Sig Dispense Refill  . Cholecalciferol (VITAMIN D-3) 125 MCG (5000 UT) TABS Take 5,000 Units by mouth daily.      No current facility-administered medications for this visit.   Allergies:   Patient has no known allergies.   Social History: The patient  reports that he has never smoked. He has never used smokeless tobacco. He reports that he does not drink alcohol and does not use drugs.   Family History: The patient's family history includes Healthy in his brother, brother, father, mother, and sister.   ROS: No palpitations or syncope.  Physical Exam: VS:  BP 124/82   Pulse 86   Ht 5\' 11"  (1.803 m)   Wt 218 lb 6.4 oz (99.1 kg)   SpO2 98%   BMI 30.46 kg/m , BMI Body mass index is 30.46 kg/m.  Wt Readings from Last 3 Encounters:  07/25/20 218 lb 6.4 oz (99.1 kg)  05/31/20 210 lb (95.3 kg)  01/29/20 220 lb (99.8 kg)    General: Patient appears comfortable at rest. HEENT: Conjunctiva and lids normal, wearing a mask. Neck: Supple, no elevated JVP or carotid bruits. Lungs: Clear to auscultation, nonlabored breathing at rest. Cardiac: Regular rate and rhythm, no S3 or significant systolic murmur, no pericardial rub. Abdomen: Soft, nontender,bowel sounds present. Extremities: No pitting edema, distal pulses 2+. Skin: Warm and dry. Musculoskeletal: No kyphosis. Neuropsychiatric: Alert and oriented x3, affect grossly appropriate.  ECG:  An ECG dated 05/31/2020 was personally reviewed today and demonstrated:  Sinus tachycardia.  Recent Labwork: 08/28/2019: TSH 1.10 01/29/2020: ALT 21; AST 17 05/31/2020: BUN 12; Creatinine, Ser 1.12; Hemoglobin 15.0; Platelets 345; Potassium 3.6; Sodium 137, high-sensitivity troponin I 3    Component Value  Date/Time   CHOL 185 08/28/2019 1640   TRIG 184 (H) 08/28/2019 1640   HDL 33 (L) 08/28/2019 1640   CHOLHDL 5.6 (H) 08/28/2019 1640   LDLCALC 121 (H) 08/28/2019 1640    Other Studies Reviewed Today:  Chest x-ray 05/31/2020: FINDINGS: Small hiatal hernia. Heart and mediastinal contours are within normal limits. No focal opacities or effusions. No acute bony abnormality.  IMPRESSION: No active cardiopulmonary  disease.  Assessment and Plan:  Recurrent atypical chest pain in a 39 year old male with history of prediabetes.  Symptoms are not clearly exertional, generally better than at onset back in September when he was seen in the ER.  No evidence of ACS at that time, chest x-ray without acute findings, ECG without acute ST segment changes.  Symptoms sound somewhat like pleurisy, although would expect this to have resolved by now.  For further screening purposes we will obtain a GXT and also an echocardiogram to ensure normal cardiac structure and function.  Medication Adjustments/Labs and Tests Ordered: Current medicines are reviewed at length with the patient today.  Concerns regarding medicines are outlined above.   Tests Ordered: Orders Placed This Encounter  Procedures  . EXERCISE TOLERANCE TEST (ETT)  . ECHOCARDIOGRAM COMPLETE    Medication Changes: No orders of the defined types were placed in this encounter.   Disposition:  Follow up test results.  Signed, Jonelle Sidle, MD, [facility] 07/25/2020 1:40 PM    [facility] at [facility] [address] [place], [place], [place] [address] Phone: [phone]; Fax: [phone]

## 2020-07-29 ENCOUNTER — Other Ambulatory Visit (HOSPITAL_COMMUNITY)
Admission: RE | Admit: 2020-07-29 | Discharge: 2020-07-29 | Disposition: A | Discharge status: Home / Self Care | Payer: 59 | Source: Ambulatory Visit | Attending: Cardiology | Admitting: Cardiology

## 2020-07-29 ENCOUNTER — Other Ambulatory Visit: Payer: Self-pay

## 2020-07-29 DIAGNOSIS — Z01818 Encounter for other preprocedural examination: Secondary | ICD-10-CM | POA: Diagnosis present

## 2020-07-29 DIAGNOSIS — Z20822 Contact with and (suspected) exposure to covid-19: Secondary | ICD-10-CM | POA: Insufficient documentation

## 2020-07-29 LAB — SARS CORONAVIRUS 2 (TAT 6-24 HRS): SARS Coronavirus 2: NEGATIVE

## 2020-07-30 ENCOUNTER — Telehealth: Payer: Self-pay | Admitting: *Deleted

## 2020-07-30 ENCOUNTER — Ambulatory Visit (HOSPITAL_COMMUNITY)
Admission: RE | Admit: 2020-07-30 | Discharge: 2020-07-30 | Disposition: A | Discharge status: Home / Self Care | Payer: 59 | Source: Ambulatory Visit | Attending: Cardiology | Admitting: Cardiology

## 2020-07-30 ENCOUNTER — Other Ambulatory Visit: Payer: Self-pay

## 2020-07-30 DIAGNOSIS — R0789 Other chest pain: Secondary | ICD-10-CM | POA: Insufficient documentation

## 2020-07-30 LAB — EXERCISE TOLERANCE TEST
Estimated workload: 9.2 METS
Exercise duration (min): 6 min
Exercise duration (sec): 45 s
MPHR: 181 {beats}/min
Peak HR: 171 {beats}/min
Percent HR: 94 %
RPE: 13
Rest HR: 98 {beats}/min

## 2020-07-30 NOTE — Telephone Encounter (Signed)
Patient informed. Copy sent to PCP °

## 2020-07-30 NOTE — Telephone Encounter (Signed)
-----   Message from Jonelle Sidle, MD sent at 07/30/2020 12:48 PM EST ----- Results reviewed.  Please let him know that stress test was reassuring, not suggestive of obstructive CAD as cause of his symptoms.  Would continue to follow with PCP for ongoing management.

## 2020-08-02 ENCOUNTER — Ambulatory Visit (HOSPITAL_COMMUNITY)
Admission: RE | Admit: 2020-08-02 | Discharge: 2020-08-02 | Disposition: A | Discharge status: Home / Self Care | Payer: 59 | Source: Ambulatory Visit | Attending: Cardiology | Admitting: Cardiology

## 2020-08-02 ENCOUNTER — Other Ambulatory Visit: Payer: Self-pay

## 2020-08-02 DIAGNOSIS — R0789 Other chest pain: Secondary | ICD-10-CM | POA: Diagnosis present

## 2020-08-02 LAB — ECHOCARDIOGRAM COMPLETE
AR max vel: 2.5 cm2
AV Area VTI: 2.61 cm2
AV Area mean vel: 2.68 cm2
AV Mean grad: 3.7 mmHg
AV Peak grad: 7.8 mmHg
Ao pk vel: 1.4 m/s
Area-P 1/2: 3.28 cm2
S' Lateral: 2.96 cm

## 2020-08-02 NOTE — Progress Notes (Signed)
*  PRELIMINARY RESULTS* Echocardiogram 2D Echocardiogram has been performed.  Stacey Drain 08/02/2020, 1:38 PM

## 2020-08-05 ENCOUNTER — Telehealth: Payer: Self-pay | Admitting: *Deleted

## 2020-08-05 NOTE — Telephone Encounter (Signed)
-----   Message from Jonelle Sidle, MD sent at 08/02/2020  4:53 PM EST ----- Results reviewed.  Echocardiogram shows vigorous LVEF, 65 to 70% with normal diastolic function, no significant valvular abnormalities.  No change in plan as per GXT recommendations.

## 2020-08-05 NOTE — Telephone Encounter (Signed)
Patient informed. Copy sent to PCP °

## 2020-08-06 ENCOUNTER — Ambulatory Visit (INDEPENDENT_AMBULATORY_CARE_PROVIDER_SITE_OTHER): Payer: 59 | Admitting: Internal Medicine

## 2020-08-06 DIAGNOSIS — R7303 Prediabetes: Secondary | ICD-10-CM

## 2020-08-07 ENCOUNTER — Encounter (INDEPENDENT_AMBULATORY_CARE_PROVIDER_SITE_OTHER): Payer: Self-pay | Admitting: Internal Medicine

## 2020-08-07 NOTE — Progress Notes (Signed)
Patient did not show for his appointment.

## 2020-08-19 ENCOUNTER — Other Ambulatory Visit: Payer: Self-pay

## 2020-08-19 ENCOUNTER — Ambulatory Visit (INDEPENDENT_AMBULATORY_CARE_PROVIDER_SITE_OTHER): Payer: 59 | Admitting: Nurse Practitioner

## 2020-08-19 ENCOUNTER — Encounter (INDEPENDENT_AMBULATORY_CARE_PROVIDER_SITE_OTHER): Payer: Self-pay | Admitting: Nurse Practitioner

## 2020-08-19 VITALS — BP 118/78 | HR 95 | Temp 97.3°F | Ht 71.0 in | Wt 217.0 lb

## 2020-08-19 DIAGNOSIS — Z23 Encounter for immunization: Secondary | ICD-10-CM | POA: Diagnosis not present

## 2020-08-19 DIAGNOSIS — E559 Vitamin D deficiency, unspecified: Secondary | ICD-10-CM | POA: Diagnosis not present

## 2020-08-19 DIAGNOSIS — R7303 Prediabetes: Secondary | ICD-10-CM | POA: Diagnosis not present

## 2020-08-19 NOTE — Progress Notes (Signed)
Subjective:  Patient ID: Herbert Davis, male    DOB: 1981-04-30  Age: 39 y.o. MRN: [id number]  CC:  Chief Complaint  Patient presents with  . Follow-up    no concerns, no changes  . Prediabetes  . Other    Vitamin D deficiency      HPI  This patient arrives today for the above.  She has a history of prediabetes. Last A1c was collected in May of this year and it was 5.3. He also has a history of vitamin D deficiency he continues on a vitamin D3 supplement. He takes 5000 IUs by mouth daily. Last blood work was collected in May of this year and showed vitamin D level of 63. He tells me he is feeling generally well and has no complaints or concerns today.  Past Medical History:  Diagnosis Date  . Pilonidal cyst   . Prediabetes 08/28/2019  . Vitamin D deficiency disease 08/28/2019      Family History  Problem Relation Age of Onset  . Healthy Mother   . Healthy Father   . Healthy Sister   . Healthy Brother   . Healthy Brother     Social History   Social History Narrative   Lives with parents .Master's degree in divinity. Unemployed. Single.   Social History   Tobacco Use  . Smoking status: Never Smoker  . Smokeless tobacco: Never Used  Substance Use Topics  . Alcohol use: No     Current Meds  Medication Sig  . Cholecalciferol (VITAMIN D-3) 125 MCG (5000 UT) TABS Take 5,000 Units by mouth daily.     ROS:  Review of Systems  HENT: Positive for tinnitus (chronic). Negative for hearing loss.   Eyes: Negative for blurred vision.  Respiratory: Negative for shortness of breath.   Cardiovascular: Negative for chest pain.  Neurological: Positive for dizziness (very intermittent). Negative for headaches.     Objective:   Today's Vitals: BP 118/78   Pulse 95   Temp (!) 97.3 F (36.3 C) (Temporal)   Ht 5\' 11"  (1.803 m)   Wt 217 lb (98.4 kg)   SpO2 97%   BMI 30.27 kg/m  Vitals with BMI 08/19/2020 08/07/2020 07/25/2020  Height 5\' 11"  (No Data) 5\' 11"     Weight 217 lbs (No Data) 218 lbs 6 oz  BMI 30.28 - 30.47  Systolic 118 (No Data) 124  Diastolic 78 (No Data) 82  Pulse 95 - 86     Physical Exam Vitals reviewed.  Constitutional:      Appearance: Normal appearance.  HENT:     Head: Normocephalic and atraumatic.  Cardiovascular:     Rate and Rhythm: Normal rate and regular rhythm.  Pulmonary:     Effort: Pulmonary effort is normal.     Breath sounds: Normal breath sounds.  Musculoskeletal:     Cervical back: Neck supple.  Skin:    General: Skin is warm and dry.  Neurological:     Mental Status: He is alert and oriented to person, place, and time.  Psychiatric:        Mood and Affect: Mood normal.        Behavior: Behavior normal.        Thought Content: Thought content normal.        Judgment: Judgment normal.          Assessment and Plan   1. Vitamin D deficiency disease   2. Needs flu shot   3. Prediabetes  Plan: 1. He will continue on his vitamin D3 supplement. Will consider collecting serum level at next office visit. 2. We will administer flu shot today. 3. He will continue to try to follow a healthy diet and remain physically active as tolerated.   Tests ordered Orders Placed This Encounter  Procedures  . Flu Vaccine QUAD 6+ mos PF IM (Fluarix Quad PF)      No orders of the defined types were placed in this encounter.   Patient to follow-up in 2 to 3 months for his annual physical exam.  Elenore Paddy, NP

## 2020-08-19 NOTE — Patient Instructions (Signed)
Influenza (Flu) Vaccine (Inactivated or Recombinant): What You Need to Know 1. Why get vaccinated? Influenza vaccine can prevent influenza (flu). Flu is a contagious disease that spreads around the [place] every year, usually between October and May. Anyone can get the flu, but it is more dangerous for some people. Infants and young children, people 39 years of age and older, pregnant women, and people with certain health conditions or a weakened immune system are at greatest risk of flu complications. Pneumonia, bronchitis, sinus infections and ear infections are examples of flu-related complications. If you have a medical condition, such as heart disease, cancer or diabetes, flu can make it worse. Flu can cause fever and chills, sore throat, muscle aches, fatigue, cough, headache, and runny or stuffy nose. Some people may have vomiting and diarrhea, though this is more common in children than adults. Each year thousands of people in the [place] die from flu, and many more are hospitalized. Flu vaccine prevents millions of illnesses and flu-related visits to the doctor each year. 2. Influenza vaccine CDC recommends everyone 53 months of age and older get vaccinated every flu season. Children 6 months through 91 years of age may need 2 doses during a single flu season. Everyone else needs only 1 dose each flu season. It takes about 2 weeks for protection to develop after vaccination. There are many flu viruses, and they are always changing. Each year a new flu vaccine is made to protect against three or four viruses that are likely to cause disease in the upcoming flu season. Even when the vaccine doesn't exactly match these viruses, it may still provide some protection. Influenza vaccine does not cause flu. Influenza vaccine may be given at the same time as other vaccines. 3. Talk with your health care provider Tell your vaccine provider if the person getting the vaccine:  Has had an  allergic reaction after a previous dose of influenza vaccine, or has any severe, life-threatening allergies.  Has ever had Guillain-Barr Syndrome (also called GBS). In some cases, your health care provider may decide to postpone influenza vaccination to a future visit. People with minor illnesses, such as a cold, may be vaccinated. People who are moderately or severely ill should usually wait until they recover before getting influenza vaccine. Your health care provider can give you more information. 4. Risks of a vaccine reaction  Soreness, redness, and swelling where shot is given, fever, muscle aches, and headache can happen after influenza vaccine.  There may be a very small increased risk of Guillain-Barr Syndrome (GBS) after inactivated influenza vaccine (the flu shot). Young children who get the flu shot along with pneumococcal vaccine (PCV13), and/or DTaP vaccine at the same time might be slightly more likely to have a seizure caused by fever. Tell your health care provider if a child who is getting flu vaccine has ever had a seizure. People sometimes faint after medical procedures, including vaccination. Tell your provider if you feel dizzy or have vision changes or ringing in the ears. As with any medicine, there is a very remote chance of a vaccine causing a severe allergic reaction, other serious injury, or death. 5. What if there is a serious problem? An allergic reaction could occur after the vaccinated person leaves the clinic. If you see signs of a severe allergic reaction (hives, swelling of the face and throat, difficulty breathing, a fast heartbeat, dizziness, or weakness), call 9-1-1 and get the person to the nearest hospital. For other signs that  concern you, call your health care provider. Adverse reactions should be reported to the Vaccine Adverse Event Reporting System (VAERS). Your health care provider will usually file this report, or you can do it yourself. Visit the  VAERS website at www.vaers.LAgents.no or call 636 872 7247.VAERS is only for reporting reactions, and VAERS staff do not give medical advice. 6. The National Vaccine Injury Compensation Program The [employer] Vaccine Injury Compensation Program (VICP) is a federal program that was created to compensate people who may have been injured by certain vaccines. Visit the VICP website at SpiritualWord.at or call 2892944677 to learn about the program and about filing a claim. There is a time limit to file a claim for compensation. 7. How can I learn more?  Ask your healthcare provider.  Call your local or state health department.  Contact the Centers for Disease Control and Prevention (CDC): ? Call 828-732-8292 (1-800-CDC-INFO) or ? Visit CDC's BiotechRoom.com.cy Vaccine Information Statement (Interim) Inactivated Influenza Vaccine (05/05/2018) This information is not intended to replace advice given to you by your health care provider. Make sure you discuss any questions you have with your health care provider. Document Revised: 12/27/2018 Document Reviewed: 05/09/2018 Elsevier Patient Education  2020 [employer].

## 2020-10-08 ENCOUNTER — Other Ambulatory Visit: Payer: Self-pay

## 2020-10-08 ENCOUNTER — Encounter (INDEPENDENT_AMBULATORY_CARE_PROVIDER_SITE_OTHER): Payer: 59 | Admitting: Nurse Practitioner

## 2020-10-30 ENCOUNTER — Ambulatory Visit (INDEPENDENT_AMBULATORY_CARE_PROVIDER_SITE_OTHER): Payer: 59 | Admitting: Nurse Practitioner

## 2020-10-30 ENCOUNTER — Encounter (INDEPENDENT_AMBULATORY_CARE_PROVIDER_SITE_OTHER): Payer: Self-pay | Admitting: Nurse Practitioner

## 2020-10-30 ENCOUNTER — Other Ambulatory Visit: Payer: Self-pay

## 2020-10-30 VITALS — BP 120/80 | HR 96 | Ht 71.0 in | Wt 218.4 lb

## 2020-10-30 DIAGNOSIS — R202 Paresthesia of skin: Secondary | ICD-10-CM

## 2020-10-30 DIAGNOSIS — Z1329 Encounter for screening for other suspected endocrine disorder: Secondary | ICD-10-CM

## 2020-10-30 DIAGNOSIS — E669 Obesity, unspecified: Secondary | ICD-10-CM | POA: Diagnosis not present

## 2020-10-30 DIAGNOSIS — E559 Vitamin D deficiency, unspecified: Secondary | ICD-10-CM

## 2020-10-30 DIAGNOSIS — Z0001 Encounter for general adult medical examination with abnormal findings: Secondary | ICD-10-CM

## 2020-10-30 DIAGNOSIS — E782 Mixed hyperlipidemia: Secondary | ICD-10-CM | POA: Diagnosis not present

## 2020-10-30 NOTE — Progress Notes (Signed)
Subjective:  Patient ID: Herbert Davis, male    DOB: 1981-06-21  Age: 40 y.o. MRN: [id number]  CC:  Chief Complaint  Patient presents with  . Annual Exam    Tingling to fingers on the left hand      HPI  This patient arrives today for the above.   He is up-to-date with flu and tetanus shot.  He is due for COVID-19 booster.  He would be due for HIV and hepatitis C screen today, but patient elects not to undergo the screenings today.  He does tell me that he has been experiencing some tingling to the fourth and fifth digit of his left hand.  This started a few weeks ago after he went skiing with family.  He denies any traumatic event that seem to elicit the pain.  He tells me the tingling is worse in the fifth digit and seems to occur with physical exertion.  He does not note any specific positions of his hands that can elicit the sensation.  He does note that sometimes he feels his hands actually get weak if he does not rest after the tingling sensation starts.  She will tell me that the tingling sensation can sometimes start in his neck and will run down the entirety of his arm into his fourth and fifth digit of his left hand.  He has no history of neck injury.  Past Medical History:  Diagnosis Date  . Pilonidal cyst   . Prediabetes 08/28/2019  . Vitamin D deficiency disease 08/28/2019      Family History  Problem Relation Age of Onset  . Healthy Mother   . Healthy Father   . Healthy Sister   . Healthy Brother   . Healthy Brother     Social History   Social History Narrative   Lives with parents .Master's degree in divinity. Unemployed. Single.   Social History   Tobacco Use  . Smoking status: Never Smoker  . Smokeless tobacco: Never Used  Substance Use Topics  . Alcohol use: No     No outpatient medications have been marked as taking for the 10/30/20 encounter (Office Visit) with Ailene Ards, NP.    ROS:  Review of Systems  Constitutional: Negative for  fever.  Respiratory: Negative for shortness of breath.   Cardiovascular: Negative for chest pain.  Neurological: Positive for sensory change and weakness. Negative for dizziness and headaches.     Objective:   Today's Vitals: BP 120/80   Pulse 96   Ht $R'5\' 11"'ff$  (1.803 m)   Wt 218 lb 6 oz (99.1 kg)   SpO2 98%   BMI 30.46 kg/m  Vitals with BMI 10/30/2020 08/19/2020 08/07/2020  Height $Remov'5\' 11"'yzGUBo$  $RemoveB'5\' 11"'HhcodCbc$  (No Data)  Weight 218 lbs 6 oz 217 lbs (No Data)  BMI 22.97 98.92 -  Systolic 119 417 (No Data)  Diastolic 80 78 (No Data)  Pulse 96 95 -     Physical Exam Vitals reviewed.  Constitutional:      General: He is not in acute distress.    Appearance: Normal appearance. He is not ill-appearing.  HENT:     Head: Normocephalic and atraumatic.     Right Ear: Tympanic membrane, ear canal and external ear normal.     Left Ear: Tympanic membrane, ear canal and external ear normal.  Eyes:     General: No scleral icterus.    Extraocular Movements: Extraocular movements intact.     Conjunctiva/sclera: Conjunctivae  normal.     Pupils: Pupils are equal, round, and reactive to light.  Neck:     Vascular: No carotid bruit.  Cardiovascular:     Rate and Rhythm: Normal rate and regular rhythm.     Pulses: Normal pulses.     Heart sounds: Normal heart sounds.  Pulmonary:     Effort: Pulmonary effort is normal.     Breath sounds: Normal breath sounds.  Abdominal:     General: Bowel sounds are normal. There is no distension.     Palpations: There is no mass.     Tenderness: There is no abdominal tenderness.     Hernia: No hernia is present.  Musculoskeletal:        General: No swelling or tenderness.     Cervical back: Normal, normal range of motion and neck supple.  Lymphadenopathy:     Cervical: No cervical adenopathy.  Skin:    General: Skin is warm and dry.  Neurological:     General: No focal deficit present.     Mental Status: He is alert and oriented to person, place, and time.      Cranial Nerves: No cranial nerve deficit.     Sensory: Sensory deficit present.     Motor: No weakness.     Gait: Gait normal.     Comments: (+) tinel sign, (+) phaeln sign. Plpation to cervical spine also elicited tingling in his 4th & 5th digit to left hand  Psychiatric:        Mood and Affect: Mood normal.        Behavior: Behavior normal.        Judgment: Judgment normal.          Assessment and Plan   1. Encounter for general adult medical examination with abnormal findings   2. Vitamin D deficiency disease   3. Mixed hyperlipidemia   4. Screening for thyroid disorder   5. Class 1 obesity with serious comorbidity in adult, unspecified BMI, unspecified obesity type   6. Paresthesias      Plan: 1.-5.  We will collect blood work today for further evaluation.  He will continue on his vitamin D3 supplement. 6.  Not sure of current etiology, wondering if he could have some nerve impingement in his neck versus carpal tunnel syndrome.  Will initially start with x-ray of the cervical spine and determine next steps based on those results.   Tests ordered Orders Placed This Encounter  Procedures  . DG Cervical Spine Complete  . Lipid Panel  . TSH  . CBC with Differential/Platelets  . CMP with eGFR([facility])  . Vitamin D, 25-hydroxy      No orders of the defined types were placed in this encounter.   Patient to follow-up in 6 to 8 weeks for close monitoring of his paresthesias to his right arm and hand, or sooner as needed.  In addition to performing his annual physical exam today also performed an office visit to address his concerns.  Ailene Ards, NP

## 2020-10-31 ENCOUNTER — Other Ambulatory Visit (INDEPENDENT_AMBULATORY_CARE_PROVIDER_SITE_OTHER): Payer: Self-pay | Admitting: Nurse Practitioner

## 2020-10-31 ENCOUNTER — Ambulatory Visit (HOSPITAL_COMMUNITY)
Admission: RE | Admit: 2020-10-31 | Discharge: 2020-10-31 | Disposition: A | Discharge status: Home / Self Care | Payer: 59 | Source: Ambulatory Visit | Attending: Nurse Practitioner | Admitting: Nurse Practitioner

## 2020-10-31 DIAGNOSIS — R202 Paresthesia of skin: Secondary | ICD-10-CM | POA: Insufficient documentation

## 2020-10-31 LAB — CBC WITH DIFFERENTIAL/PLATELET
Absolute Monocytes: 736 cells/uL (ref 200–950)
Basophils Absolute: 48 cells/uL (ref 0–200)
Basophils Relative: 0.6 %
Eosinophils Absolute: 88 cells/uL (ref 15–500)
Eosinophils Relative: 1.1 %
HCT: 45.8 % (ref 38.5–50.0)
Hemoglobin: 15.4 g/dL (ref 13.2–17.1)
Lymphs Abs: 2024 cells/uL (ref 850–3900)
MCH: 27.7 pg (ref 27.0–33.0)
MCHC: 33.6 g/dL (ref 32.0–36.0)
MCV: 82.5 fL (ref 80.0–100.0)
MPV: 11.2 fL (ref 7.5–12.5)
Monocytes Relative: 9.2 %
Neutro Abs: 5104 cells/uL (ref 1500–7800)
Neutrophils Relative %: 63.8 %
Platelets: 339 10*3/uL (ref 140–400)
RBC: 5.55 10*6/uL (ref 4.20–5.80)
RDW: 12.6 % (ref 11.0–15.0)
Total Lymphocyte: 25.3 %
WBC: 8 10*3/uL (ref 3.8–10.8)

## 2020-10-31 LAB — COMPLETE METABOLIC PANEL WITH GFR
AG Ratio: 1.4 (calc) (ref 1.0–2.5)
ALT: 21 U/L (ref 9–46)
AST: 18 U/L (ref 10–40)
Albumin: 4.5 g/dL (ref 3.6–5.1)
Alkaline phosphatase (APISO): 74 U/L (ref 36–130)
BUN: 13 mg/dL (ref 7–25)
CO2: 24 mmol/L (ref 20–32)
Calcium: 10.5 mg/dL — ABNORMAL HIGH (ref 8.6–10.3)
Chloride: 104 mmol/L (ref 98–110)
Creat: 1.17 mg/dL (ref 0.60–1.35)
GFR, Est African American: 90 mL/min/{1.73_m2} (ref >=60)
GFR, Est Non African American: 78 mL/min/{1.73_m2} (ref >=60)
Globulin: 3.2 g/dL (calc) (ref 1.9–3.7)
Glucose, Bld: 83 mg/dL (ref 65–139)
Potassium: 4.3 mmol/L (ref 3.5–5.3)
Sodium: 139 mmol/L (ref 135–146)
Total Bilirubin: 0.4 mg/dL (ref 0.2–1.2)
Total Protein: 7.7 g/dL (ref 6.1–8.1)

## 2020-10-31 LAB — VITAMIN D 25 HYDROXY (VIT D DEFICIENCY, FRACTURES): Vit D, 25-Hydroxy: 89 ng/mL (ref 30–100)

## 2020-10-31 LAB — LIPID PANEL
Cholesterol: 188 mg/dL (ref <200)
HDL: 37 mg/dL — ABNORMAL LOW (ref >=40)
LDL Cholesterol (Calc): 129 mg/dL (calc) — ABNORMAL HIGH
Non-HDL Cholesterol (Calc): 151 mg/dL (calc) — ABNORMAL HIGH (ref <130)
Total CHOL/HDL Ratio: 5.1 (calc) — ABNORMAL HIGH (ref <5.0)
Triglycerides: 117 mg/dL (ref <150)

## 2020-10-31 LAB — TSH: TSH: 1.06 mIU/L (ref 0.40–4.50)

## 2020-10-31 MED ORDER — VITAMIN D-3 125 MCG (5000 UT) PO TABS: ORAL_TABLET | ORAL | 0 refills | Status: AC

## 2020-11-12 ENCOUNTER — Telehealth (INDEPENDENT_AMBULATORY_CARE_PROVIDER_SITE_OTHER): Payer: Self-pay

## 2020-11-12 NOTE — Telephone Encounter (Signed)
F/u on appt status. Pt called he was concerned when it will be. Not heard from [facility] office. Pt was given ph # to call and schedule ASAP if he is getting worse.   Pass along that the referral was sent . Pt is still waiting for appt. Sent a secure chat to [employer], @ [facility] to find out appt status.

## 2020-11-18 NOTE — Telephone Encounter (Signed)
Will see Dr Anne Hahn on 01/17/21. Pt is aware of appt.

## 2020-12-25 ENCOUNTER — Ambulatory Visit (INDEPENDENT_AMBULATORY_CARE_PROVIDER_SITE_OTHER): Payer: 59 | Admitting: Nurse Practitioner

## 2021-01-17 ENCOUNTER — Ambulatory Visit: Payer: 59 | Admitting: Neurology

## 2021-01-17 ENCOUNTER — Other Ambulatory Visit: Payer: Self-pay

## 2021-01-17 ENCOUNTER — Encounter: Payer: Self-pay | Admitting: Neurology

## 2021-01-17 VITALS — BP 130/86 | HR 88 | Ht 71.0 in | Wt 219.6 lb

## 2021-01-17 DIAGNOSIS — R202 Paresthesia of skin: Secondary | ICD-10-CM | POA: Diagnosis not present

## 2021-01-17 NOTE — Progress Notes (Signed)
Reason for visit: Left neck and arm pain  Referring physician: Dr. Quentin Mulling JONOTHAN HEBERLE is a 40 y.o. male  History of present illness:  Mr. Bourdon is a 40 year old left-handed white male who has been seen here previously in 2019 for neck discomfort and left arm pain.  The patient back then was set up for an MRI of the cervical spine but this was never done.  The patient has had persistence of his neck and shoulder discomfort and arm pain since that time but over the last year he has started to develop persistent numbness and tingling in the fourth and fifth fingers of the left hand.  The patient feels as if the hand is slightly weak.  He does note at times when he turns his head, he can increase the tingling sensation into the left hand.  The patient reports no discomfort down the right arm.  He denies any problems with the balance, the dizziness he was having previously has improved some.  He denies issues controlling the bowels or the bladder.  He does not have any headaches coming up from the neck.  Due to the persistence and worsening of symptoms, the patient returns for further evaluation.  Past Medical History:  Diagnosis Date  . Pilonidal cyst   . Prediabetes 08/28/2019  . Vitamin D deficiency disease 08/28/2019    Past Surgical History:  Procedure Laterality Date  . PILONIDAL CYST EXCISION N/A 12/11/2019   Procedure: CYST EXCISION PILONIDAL;  Surgeon: Franky Macho, MD;  Location: [facility];  Service: General;  Laterality: N/A;  . WISDOM TOOTH EXTRACTION      Family History  Problem Relation Age of Onset  . Healthy Mother   . Healthy Father   . Healthy Sister   . Healthy Brother   . Healthy Brother     Social history:  reports that he has never smoked. He has never used smokeless tobacco. He reports that he does not drink alcohol and does not use drugs.  Medications:  Prior to Admission medications   Medication Sig Start Date End Date Taking? Authorizing Provider   Cholecalciferol (VITAMIN D-3) 125 MCG (5000 UT) TABS Take 1 tablet by mouth every other day 10/31/20   Elenore Paddy, NP     No Known Allergies  ROS:  Out of a complete 14 system review of symptoms, the patient complains only of the following symptoms, and all other reviewed systems are negative.  Left arm pain, tingling Dizziness  Blood pressure 130/86, pulse 88, height 5\' 11"  (1.803 m), weight 219 lb 9.6 oz (99.6 kg).  Physical Exam  General: The patient is alert and cooperative at the time of the examination.  Eyes: Pupils are equal, round, and reactive to light. Discs are flat bilaterally.  Neck: The neck is supple, no carotid bruits are noted.  Respiratory: The respiratory examination is clear.  Cardiovascular: The cardiovascular examination reveals a regular rate and rhythm, no obvious murmurs or rubs are noted.  Skin: Extremities are without significant edema.  Neurologic Exam  Mental status: The patient is alert and oriented x 3 at the time of the examination. The patient has apparent normal recent and remote memory, with an apparently normal attention span and concentration ability.  Cranial nerves: Facial symmetry is present. There is good sensation of the face to pinprick and soft touch bilaterally. The strength of the facial muscles and the muscles to head turning and shoulder shrug are normal bilaterally. Speech is well  enunciated, no aphasia or dysarthria is noted. Extraocular movements are full. Visual fields are full. The tongue is midline, and the patient has symmetric elevation of the soft palate. No obvious hearing deficits are noted.  Motor: The motor testing reveals 5 over 5 strength of all 4 extremities. Good symmetric motor tone is noted throughout.  Sensory: Sensory testing is intact to pinprick, soft touch, vibration sensation, and position sense on all 4 extremities. No evidence of extinction is noted.  Coordination: Cerebellar testing reveals good  finger-nose-finger and heel-to-shin bilaterally.  Gait and station: Gait is normal. Tandem gait is normal. Romberg is negative. No drift is seen.  Reflexes: Deep tendon reflexes are symmetric and normal bilaterally. Toes are downgoing bilaterally.   Assessment/Plan:  1.  Left arm pain and tingling  The patient has had a longstanding history of left arm discomfort but over the last year he has developed some tingling in the left fifth finger.  The patient was sent for MRI of the cervical spine, if this is not revealing as to the etiology of his symptoms, we will pursue EMG and nerve conduction study evaluation.  Otherwise, the patient will follow up in 4 months.   Marlan Palau MD 01/17/2021 9:43 AM  [facility] [address] [place], [place] [address]  Phone [phone] Fax [phone]

## 2021-01-21 ENCOUNTER — Telehealth: Payer: Self-pay | Admitting: Neurology

## 2021-01-21 NOTE — Telephone Encounter (Signed)
[employer] auth: [id number] (exp. 01/21/21 to 02/19/21) order sent to GI. They will reach out to the patient to schedule.

## 2021-01-28 ENCOUNTER — Ambulatory Visit
Admission: RE | Admit: 2021-01-28 | Discharge: 2021-01-28 | Disposition: A | Discharge status: Home / Self Care | Payer: 59 | Source: Ambulatory Visit | Attending: Neurology | Admitting: Neurology

## 2021-01-28 DIAGNOSIS — R202 Paresthesia of skin: Secondary | ICD-10-CM

## 2021-01-30 ENCOUNTER — Telehealth: Payer: Self-pay | Admitting: Neurology

## 2021-01-30 DIAGNOSIS — R202 Paresthesia of skin: Secondary | ICD-10-CM

## 2021-01-30 NOTE — Telephone Encounter (Signed)
  I called the patient.  MRI of the neck is normal.  We will set him up for EMG evaluation looking for evidence of a neuropathy that would explain the paresthesias and pain.  We will do nerve conductions on both arms, EMG on the left arm.  MRI cervical 01/29/21:  IMPRESSION:   Normal MRI cervical spine (without).

## 2021-02-01 ENCOUNTER — Other Ambulatory Visit: Payer: 59

## 2021-02-11 ENCOUNTER — Encounter: Payer: 59 | Admitting: Neurology

## 2021-03-10 ENCOUNTER — Ambulatory Visit: Payer: 59 | Admitting: Neurology

## 2021-03-10 ENCOUNTER — Ambulatory Visit (INDEPENDENT_AMBULATORY_CARE_PROVIDER_SITE_OTHER): Payer: 59 | Admitting: Neurology

## 2021-03-10 ENCOUNTER — Telehealth: Payer: Self-pay

## 2021-03-10 ENCOUNTER — Other Ambulatory Visit: Payer: Self-pay

## 2021-03-10 ENCOUNTER — Encounter: Payer: Self-pay | Admitting: Neurology

## 2021-03-10 DIAGNOSIS — R202 Paresthesia of skin: Secondary | ICD-10-CM | POA: Diagnosis not present

## 2021-03-10 DIAGNOSIS — R7303 Prediabetes: Secondary | ICD-10-CM

## 2021-03-10 DIAGNOSIS — M542 Cervicalgia: Secondary | ICD-10-CM

## 2021-03-10 NOTE — Progress Notes (Addendum)
The patient comes in today for EMG and nerve conduction study evaluation.  He is having left neck and arm discomfort and tingling in the left hand.  MRI of the cervical spine was normal.  EMG nerve conduction study done today was unremarkable, no evidence of a neuropathy or a radiculopathy was seen.  The patient will be set up for neuromuscular therapy working on the neck and shoulder to see if this can alleviate his symptoms.     MNC    Nerve / Sites Muscle Latency Ref. Amplitude Ref. Rel Amp Segments Distance Velocity Ref. Area    ms ms mV mV %  cm m/s m/s mVms  L Median - APB     Wrist APB 3.4 ?4.4 10.3 ?4.0 100 Wrist - APB 7   41.8     Upper arm APB 7.4  10.3  100 Upper arm - Wrist 23 56 ?49 41.4  R Median - APB     Wrist APB 3.5 ?4.4 10.7 ?4.0 100 Wrist - APB 7   42.9     Upper arm APB 7.4  10.7  99.7 Upper arm - Wrist 23 58 ?49 41.7  L Ulnar - ADM     Wrist ADM 3.1 ?3.3 9.0 ?6.0 100 Wrist - ADM 7   40.7     B.Elbow ADM 6.4  7.8  87 B.Elbow - Wrist 20 60 ?49 39.5     A.Elbow ADM 8.1  7.6  97.4 A.Elbow - B.Elbow 10 60 ?49 39.0  R Ulnar - ADM     Wrist ADM 3.0 ?3.3 8.9 ?6.0 100 Wrist - ADM 7   35.0     B.Elbow ADM 6.4  7.9  88.7 B.Elbow - Wrist 20 58 ?49 34.8     A.Elbow ADM 8.2  7.5  94.7 A.Elbow - B.Elbow 10 56 ?49 34.1             SNC    Nerve / Sites Rec. Site Peak Lat Ref.  Amp Ref. Segments Distance    ms ms V V  cm  L Median - Orthodromic (Dig II, Mid palm)     Dig II Wrist 2.9 ?3.4 27 ?10 Dig II - Wrist 13  R Median - Orthodromic (Dig II, Mid palm)     Dig II Wrist 3.0 ?3.4 23 ?10 Dig II - Wrist 13  L Ulnar - Orthodromic, (Dig V, Mid palm)     Dig V Wrist 2.9 ?3.1 10 ?5 Dig V - Wrist 11  R Ulnar - Orthodromic, (Dig V, Mid palm)     Dig V Wrist 2.7 ?3.1 13 ?5 Dig V - Wrist 53             F  Wave    Nerve F Lat Ref.   ms ms  L Ulnar - ADM 28.4 ?32.0  R Ulnar - ADM 28.4 ?32.0

## 2021-03-10 NOTE — Telephone Encounter (Signed)
Referral has been sent to [facility]. Phone: [phone].  

## 2021-03-10 NOTE — Progress Notes (Signed)
Please refer to EMG and nerve conduction procedure note.  

## 2021-03-10 NOTE — Procedures (Signed)
     HISTORY:  Herbert Davis is a 40 year old patient with a history of chronic left neck and arm discomfort with tingling and numbness in the fourth and fifth fingers on the left hand.  The patient is being evaluated for this issue.  NERVE CONDUCTION STUDIES:  Nerve conduction studies were performed on both upper extremities. The distal motor latencies and motor amplitudes for the median and ulnar nerves were within normal limits. The nerve conduction velocities for these nerves were also normal. The sensory latencies for the median and ulnar nerves were normal. The F wave latencies for the ulnar nerves were within normal limits.  EMG STUDIES:  EMG study was performed on the left upper extremity:  The first dorsal interosseous muscle reveals 2 to 4 K units with full recruitment. No fibrillations or positive waves were noted. The abductor pollicis brevis muscle reveals 2 to 4 K units with full recruitment. No fibrillations or positive waves were noted. The extensor indicis proprius muscle reveals 1 to 3 K units with full recruitment. No fibrillations or positive waves were noted. The pronator teres muscle reveals 2 to 3 K units with full recruitment. No fibrillations or positive waves were noted. The biceps muscle reveals 1 to 2 K units with full recruitment. No fibrillations or positive waves were noted. The triceps muscle reveals 2 to 4 K units with full recruitment. No fibrillations or positive waves were noted. The anterior deltoid muscle reveals 2 to 3 K units with full recruitment. No fibrillations or positive waves were noted. The cervical paraspinal muscles were tested at 2 levels. No abnormalities of insertional activity were seen at either level tested. There was poor relaxation.  IMPRESSION:  Nerve conduction studies done on both upper extremities were unremarkable.  No evidence of neuropathy was seen.  EMG evaluation of the left upper extremity is normal, there is no evidence of  an overlying cervical radiculopathy.  Marlan Palau MD 03/10/2021 2:23 PM  [facility] [address] [place], [place] [address]  Phone [phone] Fax [phone]

## 2021-03-12 ENCOUNTER — Other Ambulatory Visit: Payer: Self-pay

## 2021-03-12 ENCOUNTER — Ambulatory Visit: Payer: 59 | Attending: Neurology | Admitting: Physical Therapy

## 2021-03-12 DIAGNOSIS — M25512 Pain in left shoulder: Secondary | ICD-10-CM | POA: Diagnosis present

## 2021-03-12 DIAGNOSIS — M6281 Muscle weakness (generalized): Secondary | ICD-10-CM | POA: Diagnosis not present

## 2021-03-12 DIAGNOSIS — R208 Other disturbances of skin sensation: Secondary | ICD-10-CM | POA: Insufficient documentation

## 2021-03-12 DIAGNOSIS — M79645 Pain in left finger(s): Secondary | ICD-10-CM | POA: Insufficient documentation

## 2021-03-12 DIAGNOSIS — G8929 Other chronic pain: Secondary | ICD-10-CM | POA: Insufficient documentation

## 2021-03-12 DIAGNOSIS — M542 Cervicalgia: Secondary | ICD-10-CM | POA: Insufficient documentation

## 2021-03-12 NOTE — Therapy (Signed)
[facility] [address] [place], [place], [address] Phone: [phone]   Fax:  [phone]  Physical Therapy Evaluation  Patient Details  Name: Herbert Davis MRN: [id number] Date of Birth: [birth year] Referring Provider (PT): Herbert Davis   Encounter Date: 03/12/2021   PT End of Session - 03/12/21 1243     Visit Number 1    Number of Visits 12    Date for PT Re-Evaluation 04/23/21    Authorization Type [employer]    PT Start Time 1150    PT Stop Time 1225    PT Time Calculation (min) 35 min    Activity Tolerance Patient tolerated treatment well    Behavior During Therapy [facility] for tasks assessed/performed             Past Medical History:  Diagnosis Date   Pilonidal cyst    Prediabetes 08/28/2019   Vitamin D deficiency disease 08/28/2019    Past Surgical History:  Procedure Laterality Date   PILONIDAL CYST EXCISION N/A 12/11/2019   Procedure: CYST EXCISION PILONIDAL;  Surgeon: Herbert Macho, MD;  Location: [facility];  Service: General;  Laterality: N/A;   WISDOM TOOTH EXTRACTION      There were no vitals filed for this visit.    Subjective Assessment - 03/12/21 1149     Subjective Pt reports tingling down left arm into L pinky -- has been worsening since January. Pt with chronic neck pain in last 3-4 years. Reports "a little bit" of decreased strength. Notices it mostly when typing. Numbness/tingling worsens throughout the day. Shoulder can flare up and go down to the pinky    Patient Stated Goals Tingling to go away    Currently in Pain? Yes    Pain Score 2     Pain Location Shoulder    Pain Orientation Left    Pain Descriptors / Indicators Aching    Pain Type Chronic pain    Pain Radiating Towards L shoulder                [facility] PT Assessment - 03/12/21 0001       Assessment   Medical Diagnosis R20.2 (ICD-10-CM) - Paresthesia  M54.2 (ICD-10-CM) - Cervicalgia    Referring Provider (PT)  Herbert Davis, Herbert Davis    Hand Dominance Left    Prior Therapy Yes, vestibular      Precautions   Precautions None      Restrictions   Weight Bearing Restrictions No      Balance Screen   Has the patient fallen in the past 6 months No      Home Environment   Living Environment Private residence    Living Arrangements Alone      Prior Function   Level of Independence Independent    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      Observation/Other Assessments   Observations Forward head, at rest neck with mild R lateral flexion and side glide    Focus on Therapeutic Outcomes (FOTO)  n/a      Sensation   Light Touch Impaired by gross assessment   L pinky   Additional Comments diminished sensation C8 dermatome      ROM / Strength   AROM / PROM / Strength AROM;Strength      AROM   AROM Assessment Site Cervical    Cervical Flexion 80    Cervical Extension 45    Cervical - Right Side Bend 45  Cervical - Left Side Bend 45    Cervical - Right Rotation 55    Cervical - Left Rotation 42      Strength   Overall Strength Comments Diminished strength with myotomes: C4, C7, C8, T1    Strength Assessment Site Shoulder;Elbow;Wrist;Hand    Right/Left Shoulder Right;Left    Right Shoulder Flexion 5/5    Right Shoulder Extension 5/5    Right Shoulder ABduction 5/5    Left Shoulder Flexion 3+/5    Left Shoulder Extension 4/5    Left Shoulder ABduction 4/5    Right/Left Elbow Right;Left    Right Elbow Flexion 5/5    Right Elbow Extension 5/5    Left Elbow Flexion 4+/5    Left Elbow Extension 4-/5    Right/Left Wrist Right;Left    Right Wrist Flexion 5/5    Right Wrist Extension 5/5    Left Wrist Flexion 3+/5    Left Wrist Extension 4-/5    Right/Left hand Right;Left    Right Hand Grip (lbs) 100, 70, 78    Right Hand 3 Point Pinch 5 lbs   thumb to pinky pinch   Left Hand Grip (lbs) 56.4, 51.5, 58.8    Left Hand 3 Point Pinch 2 lbs   thumb to pinky      Palpation   Spinal mobility hypermobile cervical spine grossly    Palpation comment No overt trigger points; however, shortened/tight pecs, scalenes, and UTs noted on L      Special Tests    Special Tests Cervical;Thoracic Outlet Syndrome    Other special tests Tinel's test on ulner nerve near elbow (+)    Cervical Tests Spurling's;Dictraction    Thoracic Outlet Syndrome  Adson Test;Arms overhead      Spurling's   Findings Positive    Comment Increased symptoms      Distraction Test   Findngs Positive    side Left    Comment Improved symptoms of pain and numbness/tingling down L      Adson Test   Findings Positive   "A little bit more tingling"     Arms overhead    Findings Postive    Side Left    Comment within 28 sec more tingling                        Objective measurements completed on examination: See above findings.               PT Education - 03/12/21 1300     Education Details Exam findings, POC, HEP    Person(s) Educated Patient    Methods Explanation;Demonstration;Handout;Tactile cues;Verbal cues    Comprehension Verbalized understanding;Returned demonstration;Verbal cues required;Tactile cues required                 PT Long Term Goals - 03/12/21 1301       PT LONG TERM GOAL #1   Title Pt will be independent with HEP    Time 6    Period Weeks    Status New    Target Date 04/23/21      PT LONG TERM GOAL #2   Title Pt will report >75% resolution of L pinky numbness    Time 6    Period Weeks    Status New    Target Date 04/23/21      PT LONG TERM GOAL #3   Title Pt will have increased L grip strength to at least 70 lbs  to be more equal to R    Baseline 50-60 lbs on eval    Time 6    Period Weeks    Status New    Target Date 04/23/21      PT LONG TERM GOAL #4   Title Pt will be able to lift at least 5 lbs overhead x10 without exacerbating symptoms    Baseline Within 28 sec of shoulder elevation will get  increased symptoms    Time 6    Period Weeks    Status New    Target Date 04/23/21                    Plan - 03/12/21 1253     Clinical Impression Statement Herbert Davis is a 40 y/o M presenting to [facility] with complaint of worsening L pinky numbness within the past few months. Pt has history of chronic neck pain, prediabetes, vitamin D deficiency. On assessment, pt demos gross cervical spine hypermobility with radiating symptoms primarily along C7, C8, and T1 dermatomes/myotomes increased with tinel tapping along ulner nerve distribution near elbow. Pt demos gross L shoulder, elbow, and hand/wrist weakness compared to R. Special testing suggests plausible thoracic outlet syndrome. Pt would benefit from therapy to optimize his L UE function for work and home tasks.    Personal Factors and Comorbidities Age;Time since onset of injury/illness/exacerbation;Profession;Past/Current Experience    Examination-Activity Limitations Lift;Carry    Examination-Participation Restrictions Occupation    Stability/Clinical Decision Making Evolving/Moderate complexity    Clinical Decision Making Moderate    Rehab Potential Good    PT Frequency 2x / week   Pt requests to start after next week   PT Duration 6 weeks    PT Treatment/Interventions ADLs/Self Care Home Management;Electrical Stimulation;Moist Heat;Traction;Functional mobility training;Therapeutic activities;Therapeutic exercise;Neuromuscular re-education;Manual techniques;Patient/family education;Passive range of motion;Dry needling;Taping;Spinal Manipulations    PT Next Visit Plan Manual therapy for C-spine and T-spine as indicated. Begin ulner nerve gliding. Initiate neck stabilization exercises and shoulder strengthening. Continue to stretch pecs and scalenes.    PT Home Exercise Plan Access Code: [email or web address]    Consulted and Agree with Plan of Care Patient             Patient will benefit from skilled therapeutic intervention in order to  improve the following deficits and impairments:  Increased fascial restricitons, Impaired UE functional use, Pain, Decreased mobility, Decreased strength, Impaired sensation, Postural dysfunction  Visit Diagnosis: Muscle weakness (generalized)  Cervicalgia  Chronic left shoulder pain  Other disturbances of skin sensation  Pain in left finger(s)     Problem List Patient Active Problem List   Diagnosis Date Noted   Vitamin D deficiency disease 08/28/2019   Prediabetes 08/28/2019    Gust Eugene April Dell Ponto 03/12/2021, 1:07 PM  [facility] [address] [place], [place], [address] Phone: [phone]   Fax:  [phone]  Name: Herbert Davis MRN: [id number] Date of Birth: 18-Sep-1981

## 2021-03-25 ENCOUNTER — Ambulatory Visit: Payer: 59 | Attending: Neurology | Admitting: Physical Therapy

## 2021-03-25 ENCOUNTER — Other Ambulatory Visit: Payer: Self-pay

## 2021-03-25 DIAGNOSIS — R208 Other disturbances of skin sensation: Secondary | ICD-10-CM | POA: Insufficient documentation

## 2021-03-25 DIAGNOSIS — M542 Cervicalgia: Secondary | ICD-10-CM | POA: Diagnosis present

## 2021-03-25 DIAGNOSIS — G8929 Other chronic pain: Secondary | ICD-10-CM | POA: Diagnosis present

## 2021-03-25 DIAGNOSIS — M79645 Pain in left finger(s): Secondary | ICD-10-CM | POA: Insufficient documentation

## 2021-03-25 DIAGNOSIS — M6281 Muscle weakness (generalized): Secondary | ICD-10-CM | POA: Diagnosis present

## 2021-03-25 DIAGNOSIS — M25512 Pain in left shoulder: Secondary | ICD-10-CM | POA: Insufficient documentation

## 2021-03-25 NOTE — Therapy (Signed)
[facility] [address] [place], [place], [address] Phone: [phone]   Fax:  [phone]  Physical Therapy Treatment  Patient Details  Name: Herbert Davis MRN: [id number] Date of Birth: 1980-10-10 Referring Provider (PT): Stephanie Acre   Encounter Date: 03/25/2021   PT End of Session - 03/25/21 1447     Visit Number 2    Number of Visits 12    Date for PT Re-Evaluation 04/23/21    Authorization Type [employer]    PT Start Time 1405    PT Stop Time 1445    PT Time Calculation (min) 40 min    Activity Tolerance Patient tolerated treatment well    Behavior During Therapy [facility] for tasks assessed/performed             Past Medical History:  Diagnosis Date   Pilonidal cyst    Prediabetes 08/28/2019   Vitamin D deficiency disease 08/28/2019    Past Surgical History:  Procedure Laterality Date   PILONIDAL CYST EXCISION N/A 12/11/2019   Procedure: CYST EXCISION PILONIDAL;  Surgeon: Franky Macho, MD;  Location: [facility];  Service: General;  Laterality: N/A;   WISDOM TOOTH EXTRACTION      There were no vitals filed for this visit.   Subjective Assessment - 03/25/21 1408     Subjective Exercises been okay. "Pretty much the same." Has been doing exercises daily. Reports numbness is worse today.    Patient Stated Goals Tingling to go away    Currently in Pain? Yes    Pain Score 4     Pain Location Shoulder    Pain Orientation Left;Anterior    Pain Descriptors / Indicators Aching    Pain Type Chronic pain                               [facility] Adult PT Treatment/Exercise - 03/25/21 0001       Exercises   Exercises Shoulder      Shoulder Exercises: Supine   Horizontal ABduction Strengthening;Both;Theraband;20 reps    Theraband Level (Shoulder Horizontal ABduction) Level 2 (Red)    External Rotation Strengthening;Both;20 reps;Theraband    Theraband Level (Shoulder External Rotation)  Level 3 (Green)      Shoulder Exercises: Prone   Other Prone Exercises row 5# 3x10; attempted I's and Y's but pt reports increased pain/symptoms    Other Prone Exercises quadruped: mid trap horizontal abduction 3x10; push-up plus 3x10      Shoulder Exercises: Stretch   Other Shoulder Stretches Open close book 10x3 sec      Manual Therapy   Manual Therapy Soft tissue mobilization    Soft tissue mobilization STM & TPR Pec minor and pec major on L                    PT Education - 03/25/21 1445     Education Details Updated HEP    Person(s) Educated Patient    Methods Explanation;Demonstration;Handout;Verbal cues;Tactile cues    Comprehension Verbalized understanding;Returned demonstration;Verbal cues required;Tactile cues required                 PT Long Term Goals - 03/12/21 1301       PT LONG TERM GOAL #1   Title Pt will be independent with HEP    Time 6    Period Weeks    Status New    Target Date 04/23/21  PT LONG TERM GOAL #2   Title Pt will report >75% resolution of L pinky numbness    Time 6    Period Weeks    Status New    Target Date 04/23/21      PT LONG TERM GOAL #3   Title Pt will have increased L grip strength to at least 70 lbs to be more equal to R    Baseline 50-60 lbs on eval    Time 6    Period Weeks    Status New    Target Date 04/23/21      PT LONG TERM GOAL #4   Title Pt will be able to lift at least 5 lbs overhead x10 without exacerbating symptoms    Baseline Within 28 sec of shoulder elevation will get increased symptoms    Time 6    Period Weeks    Status New    Target Date 04/23/21                   Plan - 03/25/21 1446     Clinical Impression Statement Treatment focused on updating pt's HEP to include shoulder/mid back strengthening. Pt with continued L pec minor and major tightness/shortening addressed with manual therapy. Pt tolerated treatment well. Mild improvement of symptoms after manual therapy.     Personal Factors and Comorbidities Age;Time since onset of injury/illness/exacerbation;Profession;Past/Current Experience    Examination-Activity Limitations Lift;Carry    Examination-Participation Restrictions Occupation    Stability/Clinical Decision Making Evolving/Moderate complexity    Rehab Potential Good    PT Frequency 2x / week   Pt requests to start after next week   PT Duration 6 weeks    PT Treatment/Interventions ADLs/Self Care Home Management;Electrical Stimulation;Moist Heat;Traction;Functional mobility training;Therapeutic activities;Therapeutic exercise;Neuromuscular re-education;Manual techniques;Patient/family education;Passive range of motion;Dry needling;Taping;Spinal Manipulations    PT Next Visit Plan Manual therapy for C-spine and T-spine as indicated. Begin ulner nerve gliding. Initiate neck stabilization exercises and shoulder strengthening. Continue to stretch pecs and scalenes.    PT Home Exercise Plan Access Code: [email or web address]    Consulted and Agree with Plan of Care Patient             Patient will benefit from skilled therapeutic intervention in order to improve the following deficits and impairments:  Increased fascial restricitons, Impaired UE functional use, Pain, Decreased mobility, Decreased strength, Impaired sensation, Postural dysfunction  Visit Diagnosis: Muscle weakness (generalized)  Cervicalgia  Chronic left shoulder pain  Other disturbances of skin sensation  Pain in left finger(s)     Problem List Patient Active Problem List   Diagnosis Date Noted   Vitamin D deficiency disease 08/28/2019   Prediabetes 08/28/2019    Paulett Kaufhold April Ma L Lumir Demetriou PT, DPT 03/25/2021, 2:48 PM  [facility] [address] [place], [place], [address] Phone: [phone]   Fax:  [phone]  Name: DESHAY BLUMENFELD MRN: [id number] Date of Birth: 1981/07/08

## 2021-03-27 ENCOUNTER — Other Ambulatory Visit: Payer: Self-pay

## 2021-03-27 ENCOUNTER — Ambulatory Visit: Payer: 59 | Admitting: Physical Therapy

## 2021-03-27 DIAGNOSIS — R208 Other disturbances of skin sensation: Secondary | ICD-10-CM

## 2021-03-27 DIAGNOSIS — M25512 Pain in left shoulder: Secondary | ICD-10-CM

## 2021-03-27 DIAGNOSIS — M6281 Muscle weakness (generalized): Secondary | ICD-10-CM | POA: Diagnosis not present

## 2021-03-27 DIAGNOSIS — G8929 Other chronic pain: Secondary | ICD-10-CM

## 2021-03-27 DIAGNOSIS — M542 Cervicalgia: Secondary | ICD-10-CM

## 2021-03-27 DIAGNOSIS — M79645 Pain in left finger(s): Secondary | ICD-10-CM

## 2021-03-27 NOTE — Therapy (Signed)
[facility] [address] [place], [place], [address] Phone: [phone]   Fax:  [phone]  Physical Therapy Treatment  Patient Details  Name: Herbert Davis MRN: [id number] Date of Birth: April 11, 1981 Referring Provider (PT): Stephanie Acre   Encounter Date: 03/27/2021   PT End of Session - 03/27/21 1354     Visit Number 3    Number of Visits 12    Date for PT Re-Evaluation 04/23/21    Authorization Type [employer]    PT Start Time 1355    PT Stop Time 1440    PT Time Calculation (min) 45 min    Activity Tolerance Patient tolerated treatment well    Behavior During Therapy [facility] for tasks assessed/performed             Past Medical History:  Diagnosis Date   Pilonidal cyst    Prediabetes 08/28/2019   Vitamin D deficiency disease 08/28/2019    Past Surgical History:  Procedure Laterality Date   PILONIDAL CYST EXCISION N/A 12/11/2019   Procedure: CYST EXCISION PILONIDAL;  Surgeon: Franky Macho, MD;  Location: [facility];  Service: General;  Laterality: N/A;   WISDOM TOOTH EXTRACTION      There were no vitals filed for this visit.   Subjective Assessment - 03/27/21 1358     Subjective Feeling okay. 3/10 numbness today. Reports a little soreness after last session.    Patient Stated Goals Tingling to go away    Currently in Pain? Yes    Pain Score 4     Pain Location Shoulder                               [facility] Adult PT Treatment/Exercise - 03/27/21 0001       Shoulder Exercises: Supine   Horizontal ABduction Strengthening;Both;Theraband   3x10   Theraband Level (Shoulder Horizontal ABduction) Level 3 (Green)    External Rotation Strengthening;Both;Theraband   3x10   Theraband Level (Shoulder External Rotation) Level 3 (Green)    Other Supine Exercises low trap setting green tband 2x10      Shoulder Exercises: Seated   Other Seated Exercises Ulner nerve glide 2x10       Shoulder Exercises: Standing   Other Standing Exercises Neck retraction iso against ball 2x10; side flexion iso 2x10 each side      Shoulder Exercises: Stretch   Other Shoulder Stretches foam roll horizontal abduction stretch x20 sec before tingling; foam roll pec minor stretch x30 sec      Manual Therapy   Manual Therapy Soft tissue mobilization    Manual therapy comments side gliding cervical spine    Soft tissue mobilization STM & TPR Pec minor and pec major on L; scalene on L                    PT Education - 03/27/21 1442     Education Details Discussed importance of posture to decrease his pec and anterior neck tightness. Discussed nerve gliding    Person(s) Educated Patient    Methods Explanation;Demonstration;Tactile cues;Verbal cues;Handout    Comprehension Verbalized understanding;Returned demonstration;Verbal cues required;Tactile cues required;Need further instruction                 PT Long Term Goals - 03/12/21 1301       PT LONG TERM GOAL #1   Title Pt will be independent with HEP  Time 6    Period Weeks    Status New    Target Date 04/23/21      PT LONG TERM GOAL #2   Title Pt will report >75% resolution of L pinky numbness    Time 6    Period Weeks    Status New    Target Date 04/23/21      PT LONG TERM GOAL #3   Title Pt will have increased L grip strength to at least 70 lbs to be more equal to R    Baseline 50-60 lbs on eval    Time 6    Period Weeks    Status New    Target Date 04/23/21      PT LONG TERM GOAL #4   Title Pt will be able to lift at least 5 lbs overhead x10 without exacerbating symptoms    Baseline Within 28 sec of shoulder elevation will get increased symptoms    Time 6    Period Weeks    Status New    Target Date 04/23/21                   Plan - 03/27/21 1443     Clinical Impression Statement Continued mid back/scapular strengthening. Less tightness noted with pecs; continued scalene tightness  on L. Increased symptoms during trigger point release of scalene and pecs. Worked on increasing neck stability and added ulner nerve gliding this session.    Personal Factors and Comorbidities Age;Time since onset of injury/illness/exacerbation;Profession;Past/Current Experience    Examination-Activity Limitations Lift;Carry    Examination-Participation Restrictions Occupation    Stability/Clinical Decision Making Evolving/Moderate complexity    Rehab Potential Good    PT Frequency 2x / week   Pt requests to start after next week   PT Duration 6 weeks    PT Treatment/Interventions ADLs/Self Care Home Management;Electrical Stimulation;Moist Heat;Traction;Functional mobility training;Therapeutic activities;Therapeutic exercise;Neuromuscular re-education;Manual techniques;Patient/family education;Passive range of motion;Dry needling;Taping;Spinal Manipulations    PT Next Visit Plan Manual therapy for C-spine and T-spine as indicated. Continue ulner nerve gliding. Continue neck stabilization exercises and shoulder strengthening. Continue to stretch pecs and scalenes.    PT Home Exercise Plan Access Code: [email or web address]    Consulted and Agree with Plan of Care Patient             Patient will benefit from skilled therapeutic intervention in order to improve the following deficits and impairments:  Increased fascial restricitons, Impaired UE functional use, Pain, Decreased mobility, Decreased strength, Impaired sensation, Postural dysfunction  Visit Diagnosis: Muscle weakness (generalized)  Cervicalgia  Chronic left shoulder pain  Other disturbances of skin sensation  Pain in left finger(s)     Problem List Patient Active Problem List   Diagnosis Date Noted   Vitamin D deficiency disease 08/28/2019   Prediabetes 08/28/2019    Franklin Baumbach April Ma L Tachina Spoonemore PT, DPT 03/27/2021, 2:47 PM  [facility] [address] [place], [place],  [address] Phone: [phone]   Fax:  [phone]  Name: LORNE WINKELS MRN: [id number] Date of Birth: 06/25/1981

## 2021-04-01 ENCOUNTER — Other Ambulatory Visit: Payer: Self-pay

## 2021-04-01 ENCOUNTER — Ambulatory Visit: Payer: 59 | Admitting: Physical Therapy

## 2021-04-01 DIAGNOSIS — M6281 Muscle weakness (generalized): Secondary | ICD-10-CM

## 2021-04-01 DIAGNOSIS — M542 Cervicalgia: Secondary | ICD-10-CM

## 2021-04-01 DIAGNOSIS — G8929 Other chronic pain: Secondary | ICD-10-CM

## 2021-04-01 DIAGNOSIS — M79645 Pain in left finger(s): Secondary | ICD-10-CM

## 2021-04-01 DIAGNOSIS — R208 Other disturbances of skin sensation: Secondary | ICD-10-CM

## 2021-04-01 NOTE — Therapy (Signed)
[facility] [address] [place], [place], [address] Phone: [phone]   Fax:  [phone]  Physical Therapy Treatment  Patient Details  Name: Herbert Davis MRN: [id number] Date of Birth: 29-Jun-1981 Referring Provider (PT): Stephanie Acre   Encounter Date: 04/01/2021   PT End of Session - 04/01/21 1440     Visit Number 4    Number of Visits 12    Date for PT Re-Evaluation 04/23/21    Authorization Type [employer]    PT Start Time 1445    PT Stop Time 1530    PT Time Calculation (min) 45 min    Activity Tolerance Patient tolerated treatment well    Behavior During Therapy [facility] for tasks assessed/performed             Past Medical History:  Diagnosis Date   Pilonidal cyst    Prediabetes 08/28/2019   Vitamin D deficiency disease 08/28/2019    Past Surgical History:  Procedure Laterality Date   PILONIDAL CYST EXCISION N/A 12/11/2019   Procedure: CYST EXCISION PILONIDAL;  Surgeon: Franky Macho, MD;  Location: [facility];  Service: General;  Laterality: N/A;   WISDOM TOOTH EXTRACTION      There were no vitals filed for this visit.   Subjective Assessment - 04/01/21 1444     Subjective Pt states 2/10 numbness earlier today; however, with driving seems to have increased to 3 or 4/10.    Patient Stated Goals Tingling to go away    Currently in Pain? Yes    Pain Score 3                                [facility] Adult PT Treatment/Exercise - 04/01/21 0001       Shoulder Exercises: Sidelying   Flexion Strengthening;Left;20 reps      Shoulder Exercises: Standing   Horizontal ABduction Strengthening;10 reps;Both;Theraband   x3 sets with cervcal bracing   Theraband Level (Shoulder Horizontal ABduction) Level 3 (Green)    External Rotation Strengthening;10 reps;Theraband   x3 sets with cervical bracing   Theraband Level (Shoulder External Rotation) Level 3 (Green)      Shoulder Exercises:  Stretch   Other Shoulder Stretches foam roll horizontal abduction x10 ; foam roll pec minor stretch x30 sec      Manual Therapy   Manual therapy comments Self thoracic mobilization with foam roll    Soft tissue mobilization STM & TPR Pec minor and pec major on L; scalene on L                         PT Long Term Goals - 03/12/21 1301       PT LONG TERM GOAL #1   Title Pt will be independent with HEP    Time 6    Period Weeks    Status New    Target Date 04/23/21      PT LONG TERM GOAL #2   Title Pt will report >75% resolution of L pinky numbness    Time 6    Period Weeks    Status New    Target Date 04/23/21      PT LONG TERM GOAL #3   Title Pt will have increased L grip strength to at least 70 lbs to be more equal to R    Baseline 50-60 lbs on eval    Time  6    Period Weeks    Status New    Target Date 04/23/21      PT LONG TERM GOAL #4   Title Pt will be able to lift at least 5 lbs overhead x10 without exacerbating symptoms    Baseline Within 28 sec of shoulder elevation will get increased symptoms    Time 6    Period Weeks    Status New    Target Date 04/23/21                   Plan - 04/01/21 1529     Clinical Impression Statement Treatment focused on continuing to improve and strengthen neck and periscapular muscles. Progressed pt's exercises into standing and transition to blue tband. Encouraged pt to continue to work on his posture and neck. Ulner nerve glides adjusted to a lower level for pt comfort.    Personal Factors and Comorbidities Age;Time since onset of injury/illness/exacerbation;Profession;Past/Current Experience    Examination-Activity Limitations Lift;Carry    Examination-Participation Restrictions Occupation    Stability/Clinical Decision Making Evolving/Moderate complexity    Rehab Potential Good    PT Frequency 2x / week   Pt requests to start after next week   PT Duration 6 weeks    PT Treatment/Interventions  ADLs/Self Care Home Management;Electrical Stimulation;Moist Heat;Traction;Functional mobility training;Therapeutic activities;Therapeutic exercise;Neuromuscular re-education;Manual techniques;Patient/family education;Passive range of motion;Dry needling;Taping;Spinal Manipulations    PT Next Visit Plan Manual therapy for C-spine and T-spine as indicated (focus on thoracic mobs next session). Continue ulner nerve gliding. Continue neck stabilization exercises and shoulder strengthening. Continue to stretch pecs and scalenes.    PT Home Exercise Plan Access Code: [email or web address]    Consulted and Agree with Plan of Care Patient             Patient will benefit from skilled therapeutic intervention in order to improve the following deficits and impairments:  Increased fascial restricitons, Impaired UE functional use, Pain, Decreased mobility, Decreased strength, Impaired sensation, Postural dysfunction  Visit Diagnosis: Muscle weakness (generalized)  Chronic left shoulder pain  Cervicalgia  Other disturbances of skin sensation  Pain in left finger(s)     Problem List Patient Active Problem List   Diagnosis Date Noted   Vitamin D deficiency disease 08/28/2019   Prediabetes 08/28/2019    Prisma Decarlo April Ma L Vasily Fedewa PT, DPT 04/01/2021, 4:31 PM  [facility] [address] [place], [place], [address] Phone: [phone]   Fax:  [phone]  Name: Herbert Davis MRN: [id number] Date of Birth: 02-Nov-1980

## 2021-04-03 ENCOUNTER — Ambulatory Visit: Payer: 59 | Admitting: Physical Therapy

## 2021-04-03 ENCOUNTER — Other Ambulatory Visit: Payer: Self-pay

## 2021-04-03 DIAGNOSIS — R208 Other disturbances of skin sensation: Secondary | ICD-10-CM

## 2021-04-03 DIAGNOSIS — M79645 Pain in left finger(s): Secondary | ICD-10-CM

## 2021-04-03 DIAGNOSIS — M542 Cervicalgia: Secondary | ICD-10-CM

## 2021-04-03 DIAGNOSIS — M6281 Muscle weakness (generalized): Secondary | ICD-10-CM | POA: Diagnosis not present

## 2021-04-03 DIAGNOSIS — G8929 Other chronic pain: Secondary | ICD-10-CM

## 2021-04-03 NOTE — Therapy (Signed)
[facility] [address] [place], [place], [address] Phone: [phone]   Fax:  [phone]  Physical Therapy Treatment  Patient Details  Name: Herbert Davis MRN: [id number] Date of Birth: 04-03-1981 Referring Provider (PT): Stephanie Acre   Encounter Date: 04/03/2021   PT End of Session - 04/03/21 1406     Visit Number 5    Number of Visits 12    Date for PT Re-Evaluation 04/23/21    Authorization Type [employer]    PT Start Time 1315    PT Stop Time 1405    PT Time Calculation (min) 50 min    Activity Tolerance Patient tolerated treatment well    Behavior During Therapy [facility] for tasks assessed/performed             Past Medical History:  Diagnosis Date   Pilonidal cyst    Prediabetes 08/28/2019   Vitamin D deficiency disease 08/28/2019    Past Surgical History:  Procedure Laterality Date   PILONIDAL CYST EXCISION N/A 12/11/2019   Procedure: CYST EXCISION PILONIDAL;  Surgeon: Franky Macho, MD;  Location: [facility];  Service: General;  Laterality: N/A;   WISDOM TOOTH EXTRACTION      There were no vitals filed for this visit.   Subjective Assessment - 04/03/21 1317     Subjective Nothing new or different. Pt reports no issues with upright exercises. 6 or 7/10 numbness/tingling today -- had to help move something.    Patient Stated Goals Tingling to go away    Currently in Pain? Yes    Pain Score 6     Pain Location Shoulder    Pain Orientation Left    Pain Descriptors / Indicators Aching                               [facility] Adult PT Treatment/Exercise - 04/03/21 0001       Shoulder Exercises: Supine   Other Supine Exercises chin tuck 10x10"    Other Supine Exercises chin tuck with rotation x5 bilat; chin tuck with neck extension 10x3"      Shoulder Exercises: Seated   Other Seated Exercises JPE with rotation x5 EO & then EC      Shoulder Exercises: Stretch   Other  Shoulder Stretches open/close book 10x3"      Manual Therapy   Manual Therapy Joint mobilization    Manual therapy comments PROM neck flexion + R side flexion with side gliding    Joint Mobilization Cervical side gliding/gapping, PA mobs; thoracic rotation and PA mobs; manual traction 5x30"    Soft tissue mobilization STM & TPR levator, UT, suboccipitals, supraspinatus                         PT Long Term Goals - 03/12/21 1301       PT LONG TERM GOAL #1   Title Pt will be independent with HEP    Time 6    Period Weeks    Status New    Target Date 04/23/21      PT LONG TERM GOAL #2   Title Pt will report >75% resolution of L pinky numbness    Time 6    Period Weeks    Status New    Target Date 04/23/21      PT LONG TERM GOAL #3   Title Pt will have increased L  grip strength to at least 70 lbs to be more equal to R    Baseline 50-60 lbs on eval    Time 6    Period Weeks    Status New    Target Date 04/23/21      PT LONG TERM GOAL #4   Title Pt will be able to lift at least 5 lbs overhead x10 without exacerbating symptoms    Baseline Within 28 sec of shoulder elevation will get increased symptoms    Time 6    Period Weeks    Status New    Target Date 04/23/21                   Plan - 04/03/21 1403     Clinical Impression Statement 3.5/10 numbness by end of session; most relief found with combo cervical flexion + R side flexion. Increased time spent performing manual therapy to address pt's increased symptoms along cervical and thoracic spine. Worked on neck/cervical stabilization. Performed joint position error testing -- pt demos 5/5 errors during return to neutral when neck is rotated to the right. Worked on getting pt to learn neutral cervical neck positioning in seated.    Personal Factors and Comorbidities Age;Time since onset of injury/illness/exacerbation;Profession;Past/Current Experience    Examination-Activity Limitations Lift;Carry     Examination-Participation Restrictions Occupation    Stability/Clinical Decision Making Evolving/Moderate complexity    Rehab Potential Good    PT Frequency 2x / week   Pt requests to start after next week   PT Duration 6 weeks    PT Treatment/Interventions ADLs/Self Care Home Management;Electrical Stimulation;Moist Heat;Traction;Functional mobility training;Therapeutic activities;Therapeutic exercise;Neuromuscular re-education;Manual techniques;Patient/family education;Passive range of motion;Dry needling;Taping;Spinal Manipulations    PT Next Visit Plan Manual therapy for C-spine and T-spine as indicated (focus on thoracic mobs next session). Continue ulner nerve gliding. Continue neck stabilization exercises and shoulder strengthening. Continue to stretch pecs and scalenes.    PT Home Exercise Plan Access Code: [email or web address]; Encouraged pt to work on his cervical neck retraction and holding it 10 sec every 30 minutes    Consulted and Agree with Plan of Care Patient             Patient will benefit from skilled therapeutic intervention in order to improve the following deficits and impairments:  Increased fascial restricitons, Impaired UE functional use, Pain, Decreased mobility, Decreased strength, Impaired sensation, Postural dysfunction  Visit Diagnosis: Muscle weakness (generalized)  Chronic left shoulder pain  Cervicalgia  Other disturbances of skin sensation  Pain in left finger(s)     Problem List Patient Active Problem List   Diagnosis Date Noted   Vitamin D deficiency disease 08/28/2019   Prediabetes 08/28/2019    Margene Cherian April Ma L Rikayla Demmon PT, DPT 04/03/2021, 2:20 PM  [facility] [address] [place], [place], [address] Phone: [phone]   Fax:  [phone]  Name: Herbert Davis MRN: [id number] Date of Birth: 10-31-1980

## 2021-04-08 ENCOUNTER — Other Ambulatory Visit: Payer: Self-pay

## 2021-04-08 ENCOUNTER — Ambulatory Visit: Payer: 59 | Admitting: Physical Therapy

## 2021-04-08 DIAGNOSIS — M542 Cervicalgia: Secondary | ICD-10-CM

## 2021-04-08 DIAGNOSIS — M79645 Pain in left finger(s): Secondary | ICD-10-CM

## 2021-04-08 DIAGNOSIS — M6281 Muscle weakness (generalized): Secondary | ICD-10-CM

## 2021-04-08 DIAGNOSIS — R208 Other disturbances of skin sensation: Secondary | ICD-10-CM

## 2021-04-08 DIAGNOSIS — G8929 Other chronic pain: Secondary | ICD-10-CM

## 2021-04-08 NOTE — Therapy (Signed)
[facility] [address] [place], [place], [address] Phone: [phone]   Fax:  [phone]  Physical Therapy Treatment  Patient Details  Name: Herbert Davis MRN: [id number] Date of Birth: 1981/03/21 Referring Provider (PT): Stephanie Acre   Encounter Date: 04/08/2021   PT End of Session - 04/08/21 1449     Visit Number 6    Number of Visits 12    Date for PT Re-Evaluation 04/23/21    Authorization Type [employer]    PT Start Time 1405    PT Stop Time 1445    PT Time Calculation (min) 40 min    Activity Tolerance Patient tolerated treatment well    Behavior During Therapy [facility] for tasks assessed/performed             Past Medical History:  Diagnosis Date   Pilonidal cyst    Prediabetes 08/28/2019   Vitamin D deficiency disease 08/28/2019    Past Surgical History:  Procedure Laterality Date   PILONIDAL CYST EXCISION N/A 12/11/2019   Procedure: CYST EXCISION PILONIDAL;  Surgeon: Franky Macho, MD;  Location: [facility];  Service: General;  Laterality: N/A;   WISDOM TOOTH EXTRACTION      There were no vitals filed for this visit.   Subjective Assessment - 04/08/21 1408     Subjective Nothing new or different. 2/10 numbness/tingling. Reports forgetting to do chin tucks every 30 min to an hour.    Patient Stated Goals Tingling to go away    Currently in Pain? Yes    Pain Score 2     Pain Location Shoulder    Pain Orientation Left    Pain Descriptors / Indicators Aching                               [facility] Adult PT Treatment/Exercise - 04/08/21 0001       Shoulder Exercises: Seated   Other Seated Exercises JPE with rotation 2x10 EC, head nods 2x10 EC      Shoulder Exercises: Standing   Extension Strengthening;10 reps;Both;Theraband   x3 sets   Theraband Level (Shoulder Extension) Level 3 (Green)    Extension Limitations with cervical bracing    Occupational hygienist;Both   3x10  green tband with neck bracing   Diagonals Strengthening;Right;Left;20 reps    Diagonals Limitations attempted with green tband but too symptomatic    Other Standing Exercises neck bracing bicep curl green tband 3x10    Other Standing Exercises neck retraction x10 into red pball                         PT Long Term Goals - 03/12/21 1301       PT LONG TERM GOAL #1   Title Pt will be independent with HEP    Time 6    Period Weeks    Status New    Target Date 04/23/21      PT LONG TERM GOAL #2   Title Pt will report >75% resolution of L pinky numbness    Time 6    Period Weeks    Status New    Target Date 04/23/21      PT LONG TERM GOAL #3   Title Pt will have increased L grip strength to at least 70 lbs to be more equal to R    Baseline 50-60 lbs on eval  Time 6    Period Weeks    Status New    Target Date 04/23/21      PT LONG TERM GOAL #4   Title Pt will be able to lift at least 5 lbs overhead x10 without exacerbating symptoms    Baseline Within 28 sec of shoulder elevation will get increased symptoms    Time 6    Period Weeks    Status New    Target Date 04/23/21                   Plan - 04/08/21 1435     Clinical Impression Statement Less numbness starting off this session. Continued to work on cervical stabilization with shoulder/UE strengthening. Continued work to improve pt's neck proprioception. Pt with improving vertical and horizontal proprioception with JPE. Encouraged pt to continue to work on his posture and neck stability at home.    Personal Factors and Comorbidities Age;Time since onset of injury/illness/exacerbation;Profession;Past/Current Experience    Examination-Activity Limitations Lift;Carry    Examination-Participation Restrictions Occupation    Stability/Clinical Decision Making Evolving/Moderate complexity    Rehab Potential Good    PT Frequency 2x / week   Pt requests to start after next week   PT Duration 6 weeks     PT Treatment/Interventions ADLs/Self Care Home Management;Electrical Stimulation;Moist Heat;Traction;Functional mobility training;Therapeutic activities;Therapeutic exercise;Neuromuscular re-education;Manual techniques;Patient/family education;Passive range of motion;Dry needling;Taping;Spinal Manipulations    PT Next Visit Plan Manual therapy for C-spine and T-spine as indicated (focus on thoracic mobs next session). Progress JPE into standing. Continue ulner nerve gliding. Continue neck stabilization exercises and shoulder/UE strengthening. Continue to stretch pecs and scalenes.    PT Home Exercise Plan Access Code: [email or web address]; Encouraged pt to work on his cervical neck retraction and holding it 10 sec every 30 minutes    Consulted and Agree with Plan of Care Patient             Patient will benefit from skilled therapeutic intervention in order to improve the following deficits and impairments:  Increased fascial restricitons, Impaired UE functional use, Pain, Decreased mobility, Decreased strength, Impaired sensation, Postural dysfunction  Visit Diagnosis: Muscle weakness (generalized)  Chronic left shoulder pain  Cervicalgia  Other disturbances of skin sensation  Pain in left finger(s)     Problem List Patient Active Problem List   Diagnosis Date Noted   Vitamin D deficiency disease 08/28/2019   Prediabetes 08/28/2019    Pilar Westergaard April Ma L Jaimen Melone PT, DPT 04/08/2021, 2:54 PM  [facility] [place] [address] [place], [place], [address] Phone: [phone]   Fax:  [phone]  Name: Herbert Davis MRN: [id number] Date of Birth: [birth year]

## 2021-04-10 ENCOUNTER — Other Ambulatory Visit: Payer: Self-pay

## 2021-04-10 ENCOUNTER — Ambulatory Visit: Payer: 59 | Admitting: Physical Therapy

## 2021-04-10 DIAGNOSIS — G8929 Other chronic pain: Secondary | ICD-10-CM

## 2021-04-10 DIAGNOSIS — M79645 Pain in left finger(s): Secondary | ICD-10-CM

## 2021-04-10 DIAGNOSIS — M6281 Muscle weakness (generalized): Secondary | ICD-10-CM

## 2021-04-10 DIAGNOSIS — M542 Cervicalgia: Secondary | ICD-10-CM

## 2021-04-10 DIAGNOSIS — R208 Other disturbances of skin sensation: Secondary | ICD-10-CM

## 2021-04-10 NOTE — Therapy (Signed)
[facility] [address] [place], [place], [address] Phone: [phone]   Fax:  [phone]  Physical Therapy Treatment  Patient Details  Name: Herbert Davis MRN: [id number] Date of Birth: 1980/12/06 Referring Provider (Davis): Stephanie Acre   Encounter Date: 04/10/2021   Davis End of Session - 04/10/21 1328     Visit Number 7    Number of Visits 12    Date for Davis Re-Evaluation 04/23/21    Authorization Type [employer]    Davis Start Time 1315    Davis Stop Time 1400    Davis Time Calculation (min) 45 min    Activity Tolerance Patient tolerated treatment well    Behavior During Therapy [facility] for tasks assessed/performed             Past Medical History:  Diagnosis Date   Pilonidal cyst    Prediabetes 08/28/2019   Vitamin D deficiency disease 08/28/2019    Past Surgical History:  Procedure Laterality Date   PILONIDAL CYST EXCISION N/A 12/11/2019   Procedure: CYST EXCISION PILONIDAL;  Surgeon: Franky Macho, MD;  Location: [facility];  Service: General;  Laterality: N/A;   WISDOM TOOTH EXTRACTION      There were no vitals filed for this visit.   Subjective Assessment - 04/10/21 1320     Subjective 2/10 N/T. Davis has been working on posture. Exercises okay but has not been able to find anything to increase weight.    Patient Stated Goals Tingling to go away    Currently in Pain? Yes    Pain Score 2     Pain Location Shoulder    Pain Orientation Left                               [facility] Adult Davis Treatment/Exercise - 04/10/21 0001       Shoulder Exercises: Supine   Other Supine Exercises DNF off BP cuff maintaining at 10 mmhg starting from 20 mmhg 3x30 se      Shoulder Exercises: Prone   Other Prone Exercises quadruped: chin tuck x10, chin tuck with rotation x10, chin tuck + extension x10      Shoulder Exercises: Standing   Other Standing Exercises neck bracing bicep curl black tband 3x10,  tricep black tband 3x10    Other Standing Exercises serratus anterior with foam roll on wall 3x10 to remain in asymptomatic range      Manual Therapy   Manual therapy comments Upper trap stretch    Joint Mobilization Cervical side glide    Soft tissue mobilization STM & TPR levator, UT, supraspinatus                         Davis Long Term Goals - 03/12/21 1301       Davis LONG TERM GOAL #1   Title Davis will be independent with HEP    Time 6    Period Weeks    Status New    Target Date 04/23/21      Davis LONG TERM GOAL #2   Title Davis will report >75% resolution of L pinky numbness    Time 6    Period Weeks    Status New    Target Date 04/23/21      Davis LONG TERM GOAL #3   Title Davis will have increased L grip strength to at least 70 lbs to  be more equal to R    Baseline 50-60 lbs on eval    Time 6    Period Weeks    Status New    Target Date 04/23/21      Davis LONG TERM GOAL #4   Title Davis will be able to lift at least 5 lbs overhead x10 without exacerbating symptoms    Baseline Within 28 sec of shoulder elevation will get increased symptoms    Time 6    Period Weeks    Status New    Target Date 04/23/21                   Plan - 04/10/21 1647     Clinical Impression Statement Davis comes into clinic with improved posture. Less numbness today. Continued to progress cervical stabilization and shoulder/UE strengthening. >15# Davis demos shoulder/neck compensation. May benefit from [facility] next session for UT and pec trigger points.    Personal Factors and Comorbidities Age;Time since onset of injury/illness/exacerbation;Profession;Past/Current Experience    Examination-Activity Limitations Lift;Carry    Examination-Participation Restrictions Occupation    Stability/Clinical Decision Making Evolving/Moderate complexity    Rehab Potential Good    Davis Frequency 2x / week   Davis requests to start after next week   Davis Duration 6 weeks    Davis Treatment/Interventions ADLs/Self  Care Home Management;Electrical Stimulation;Moist Heat;Traction;Functional mobility training;Therapeutic activities;Therapeutic exercise;Neuromuscular re-education;Manual techniques;Patient/family education;Passive range of motion;Dry needling;Taping;Spinal Manipulations    Davis Next Visit Plan Audra -- may benefit from UT and pec TPDN. He's improved with finding "neutral" neck position in sitting/standing -- we've worked into quadruped for added stability. Manual therapy for C-spine and T-spine as indicated (focus on thoracic mobs next session) and stretching. Continue ulner nerve gliding if Davis tolerates. Continue neck stabilization exercises and shoulder/UE strengthening.    Davis Home Exercise Plan Access Code: [email or web address]; Encouraged Davis to work on his cervical neck retraction and holding it 10 sec every 30 minutes    Consulted and Agree with Plan of Care Patient             Patient will benefit from skilled therapeutic intervention in order to improve the following deficits and impairments:  Increased fascial restricitons, Impaired UE functional use, Pain, Decreased mobility, Decreased strength, Impaired sensation, Postural dysfunction  Visit Diagnosis: Muscle weakness (generalized)  Chronic left shoulder pain  Cervicalgia  Other disturbances of skin sensation  Pain in left finger(s)     Problem List Patient Active Problem List   Diagnosis Date Noted   Vitamin D deficiency disease 08/28/2019   Prediabetes 08/28/2019    Herbert Davis, DPT 04/10/2021, 4:53 PM  [facility] [address] [place], [place], [address] Phone: [phone]   Fax:  [phone]  Name: Herbert Davis MRN: [id number] Date of Birth: 29-Mar-1981

## 2021-04-15 ENCOUNTER — Other Ambulatory Visit: Payer: Self-pay

## 2021-04-15 ENCOUNTER — Ambulatory Visit: Payer: 59 | Admitting: Physical Therapy

## 2021-04-15 DIAGNOSIS — M6281 Muscle weakness (generalized): Secondary | ICD-10-CM

## 2021-04-15 DIAGNOSIS — R208 Other disturbances of skin sensation: Secondary | ICD-10-CM

## 2021-04-15 DIAGNOSIS — M542 Cervicalgia: Secondary | ICD-10-CM

## 2021-04-15 DIAGNOSIS — G8929 Other chronic pain: Secondary | ICD-10-CM

## 2021-04-15 NOTE — Therapy (Signed)
[facility] [address] [place], [place], [address] Phone: [phone]   Fax:  [phone]  Physical Therapy Treatment  Patient Details  Name: Herbert Davis MRN: [id number] Date of Birth: [birth year] Referring Provider (PT): Stephanie Acre   Encounter Date: 04/15/2021   PT End of Session - 04/15/21 1407     Visit Number 8    Number of Visits 12    Date for PT Re-Evaluation 04/23/21    Authorization Type [employer]    PT Start Time 1405    PT Stop Time 1447    PT Time Calculation (min) 42 min    Activity Tolerance Patient tolerated treatment well    Behavior During Therapy [facility] for tasks assessed/performed             Past Medical History:  Diagnosis Date   Pilonidal cyst    Prediabetes 08/28/2019   Vitamin D deficiency disease 08/28/2019    Past Surgical History:  Procedure Laterality Date   PILONIDAL CYST EXCISION N/A 12/11/2019   Procedure: CYST EXCISION PILONIDAL;  Surgeon: Franky Macho, MD;  Location: [facility];  Service: General;  Laterality: N/A;   WISDOM TOOTH EXTRACTION      There were no vitals filed for this visit.   Subjective Assessment - 04/15/21 1408     Subjective 4/10 N/T today, thinks driving flared symptoms.  Had to drive in heavy rain and tensed up.    Patient Stated Goals Tingling to go away    Currently in Pain? Yes    Pain Score 4     Pain Orientation Left    Pain Descriptors / Indicators Tingling                [facility] PT Assessment - 04/15/21 1624       Special Tests    Special Tests Thoracic Outlet Syndrome    Thoracic Outlet Syndrome  Costcoclavicular Syndrome Test;Adson Test      Adson Test   Findings Positive    Side  Left    Comment Increased tingling      Costcoclavicular Syndrome Test   Findings Positive    Side Left    Comment increased tingling, diminished pulse            Also performed Wright's test to Left UE with abolishment of pulse and  increased tingling; did not perform over ABD due to symptoms.     [facility] Adult PT Treatment/Exercise - 04/15/21 1624       Therapeutic Activites    Therapeutic Activities Other Therapeutic Activities    Other Therapeutic Activities Reviewed handout for dry needling.  Educated pt on purpose, methodology, possible side effects, indications to stop dry needling, ways to mitigate side effects, benefits of dry needling and screened pt for any precautions or contraindications. Pt has history of dizziness; discussed monitoring for any sympathetic symptoms.  Patient agreeable.      Shoulder Exercises: Stretch   Wall Stretch - ABduction 1 rep;30 seconds    Wall Stretch - ABduction Limitations pec minor stretch with hand on wall and rotation away from LUE to tolerance.  Added back into HEP      Manual Therapy   Manual Therapy Joint mobilization    Joint Mobilization Cervico-thoracic junction mobilizations bilaterally combined with inhalation, UE flexion and cervical rotation.  Performed x 5 reps on R and 3 sets x 5 reps on L.  Followed with seated first rib depression/mobilization with cervical lateral flexion to  same side x 5 reps x 3 sets.                    PT Education - 04/15/21 1641     Education Details dry needling handout; added pec stretch back into HEP    Person(s) Educated Patient    Methods Explanation;Demonstration;Handout    Comprehension Verbalized understanding;Returned demonstration                 PT Long Term Goals - 03/12/21 1301       PT LONG TERM GOAL #1   Title Pt will be independent with HEP    Time 6    Period Weeks    Status New    Target Date 04/23/21      PT LONG TERM GOAL #2   Title Pt will report >75% resolution of L pinky numbness    Time 6    Period Weeks    Status New    Target Date 04/23/21      PT LONG TERM GOAL #3   Title Pt will have increased L grip strength to at least 70 lbs to be more equal to R    Baseline 50-60 lbs on  eval    Time 6    Period Weeks    Status New    Target Date 04/23/21      PT LONG TERM GOAL #4   Title Pt will be able to lift at least 5 lbs overhead x10 without exacerbating symptoms    Baseline Within 28 sec of shoulder elevation will get increased symptoms    Time 6    Period Weeks    Status New    Target Date 04/23/21                   Plan - 04/15/21 1616     Clinical Impression Statement Initiated education regarding dry needling and reviewed testing for thoracic outlet syndrome to determine location of compression.  Pt appears to have combination of pec minor and costo-clavicular involvement.  Unable to perform dry needling today; unsafe due to power outage.  Instead continued to focus on upper thoracic spine mobility during UE elevation, first rib mobilization and pectoralis stretches.  Will initiate dry needling next session.    Personal Factors and Comorbidities Age;Time since onset of injury/illness/exacerbation;Profession;Past/Current Experience    Examination-Activity Limitations Lift;Carry    Examination-Participation Restrictions Occupation    Stability/Clinical Decision Making Evolving/Moderate complexity    Rehab Potential Good    PT Frequency 2x / week   Pt requests to start after next week   PT Duration 6 weeks    PT Treatment/Interventions ADLs/Self Care Home Management;Electrical Stimulation;Moist Heat;Traction;Functional mobility training;Therapeutic activities;Therapeutic exercise;Neuromuscular re-education;Manual techniques;Patient/family education;Passive range of motion;Dry needling;Taping;Spinal Manipulations    PT Next Visit Plan DN of L pec, UT, check SCM.  Continue first rib mobs and show how to perform 1st rib mobilizations with strap. Give handout of pec stretch. He's improved with finding "neutral" neck position in sitting/standing -- we've worked into quadruped for added stability. Manual therapy for C-spine and T-spine as indicated (focus on  thoracic mobs next session) and stretching. Continue ulner nerve gliding if pt tolerates. Continue neck stabilization exercises and shoulder/UE strengthening.    PT Home Exercise Plan Access Code: [email or web address]; Encouraged pt to work on his cervical neck retraction and holding it 10 sec every 30 minutes    Consulted and Agree with Plan of Care Patient  Patient will benefit from skilled therapeutic intervention in order to improve the following deficits and impairments:  Increased fascial restricitons, Impaired UE functional use, Pain, Decreased mobility, Decreased strength, Impaired sensation, Postural dysfunction  Visit Diagnosis: Muscle weakness (generalized)  Chronic left shoulder pain  Cervicalgia  Other disturbances of skin sensation     Problem List Patient Active Problem List   Diagnosis Date Noted   Vitamin D deficiency disease 08/28/2019   Prediabetes 08/28/2019    Dierdre Highman, PT, DPT 04/15/21    4:43 PM    [facility] [address] [place], [place], [address] Phone: [phone]   Fax:  [phone]  Name: Herbert Davis MRN: [id number] Date of Birth: 06-May-1981

## 2021-04-15 NOTE — Patient Instructions (Addendum)
Trigger Point Dry Needling  What is Trigger Point Dry Needling (DN)? DN is a physical therapy technique used to treat muscle pain and dysfunction. Specifically, DN helps deactivate muscle trigger points (muscle knots).  A thin filiform needle is used to penetrate the skin and stimulate the underlying trigger point. The goal is for a local twitch response (LTR) to occur and for the trigger point to relax. No medication of any kind is injected during the procedure.   What Does Trigger Point Dry Needling Feel Like?  The procedure feels different for each individual patient. Some patients report that they do not actually feel the needle enter the skin and overall the process is not painful. Very mild bleeding may occur. However, many patients feel a deep cramping in the muscle in which the needle was inserted. This is the local twitch response.   How Will I feel after the treatment? Soreness is normal, and the onset of soreness may not occur for a few hours. Typically this soreness does not last longer than two days.  Bruising is uncommon, however; ice can be used to decrease any possible bruising.  In rare cases feeling tired or nauseous after the treatment is normal. In addition, your symptoms may get worse before they get better, this period will typically not last longer than 24 hours.   What Can I do After My Treatment? Increase your hydration by drinking more water for the next 24 hours. You may place ice or heat on the areas treated that have become sore, however, do not use heat on inflamed or bruised areas. Heat often brings more relief post needling. You can continue your regular activities, but vigorous activity is not recommended initially after the treatment for 24 hours. DN is best combined with other physical therapy such as strengthening, stretching, and other therapies     Access Code: [email or web address] URL: [email or web address] Date: 04/15/2021 Prepared by: Bufford Lope  Exercises Seated [place] Stretch with Towel - 1 x daily - 7 x weekly - 1 sets - 20- 30 sec hold Upper Trap Stretch with Belt - 1 x daily - 7 x weekly - 1 sets - 10 reps Sidelying Open Book Thoracic Lumbar Rotation and Extension - 1 x daily - 7 x weekly - 1 sets - 10 reps - 5 sec hold Standing Shoulder Horizontal Abduction with Resistance - 1 x daily - 7 x weekly - 3 sets - 10 reps Shoulder External Rotation and Scapular Retraction with Resistance - 1 x daily - 7 x weekly - 3 sets - 10 reps Standing Bicep Curls Supinated with Dumbbells - 1 x daily - 7 x weekly - 3 sets - 10 reps Standing Shoulder Horizontal and Diagonal Pulls with Resistance - 1 x daily - 7 x weekly - 3 sets - 10 reps Ulnar Nerve Mobilization - Low Level - 1 x daily - 7 x weekly - 1 sets - 10 reps Standing Scaption Slide with Wall - 1 x daily - 7 x weekly - 3 sets - 10 reps Standing Pec Stretch at Wall - 1 x daily - 7 x weekly - 2 sets - 30 second hold hold

## 2021-04-17 ENCOUNTER — Other Ambulatory Visit: Payer: Self-pay

## 2021-04-17 ENCOUNTER — Ambulatory Visit: Payer: 59 | Admitting: Physical Therapy

## 2021-04-17 DIAGNOSIS — R208 Other disturbances of skin sensation: Secondary | ICD-10-CM

## 2021-04-17 DIAGNOSIS — M6281 Muscle weakness (generalized): Secondary | ICD-10-CM

## 2021-04-17 DIAGNOSIS — M542 Cervicalgia: Secondary | ICD-10-CM

## 2021-04-17 DIAGNOSIS — G8929 Other chronic pain: Secondary | ICD-10-CM

## 2021-04-17 DIAGNOSIS — M25512 Pain in left shoulder: Secondary | ICD-10-CM

## 2021-04-17 NOTE — Therapy (Signed)
[facility] [address] [place], [place], [address] Phone: [phone]   Fax:  [phone]  Physical Therapy Treatment  Patient Details  Name: Herbert Davis MRN: [id number] Date of Birth: August 02, 1981 Referring Provider (PT): Stephanie Acre   Encounter Date: 04/17/2021   PT End of Session - 04/17/21 1323     Visit Number 9    Number of Visits 12    Date for PT Re-Evaluation 04/23/21    Authorization Type [employer]    PT Start Time 1320    PT Stop Time 1403    PT Time Calculation (min) 43 min    Activity Tolerance Patient tolerated treatment well    Behavior During Therapy [facility] for tasks assessed/performed             Past Medical History:  Diagnosis Date   Pilonidal cyst    Prediabetes 08/28/2019   Vitamin D deficiency disease 08/28/2019    Past Surgical History:  Procedure Laterality Date   PILONIDAL CYST EXCISION N/A 12/11/2019   Procedure: CYST EXCISION PILONIDAL;  Surgeon: Franky Macho, MD;  Location: [facility];  Service: General;  Laterality: N/A;   WISDOM TOOTH EXTRACTION      There were no vitals filed for this visit.   Subjective Assessment - 04/17/21 1323     Subjective Driving was about the same; 2-3 tingling in the shoulder.  Felt no better and no worse after last session.    Patient Stated Goals Tingling to go away    Currently in Pain? Yes    Pain Score 3     Pain Location Arm    Pain Orientation Left              [facility] Adult PT Treatment/Exercise - 04/17/21 1631       Exercises   Exercises Other Exercises    Other Exercises  In quadruped placed pillow case on floor under LUE and performed sliding, alternating between pec stretch and rhomboid stretch with thread the needle x 3 reps each.      Modalities   Modalities Cryotherapy      Cryotherapy   Number Minutes Cryotherapy 15 Minutes    Cryotherapy Location Cervical    Type of Cryotherapy Ice pack      Manual Therapy    Manual Therapy Joint mobilization;Soft tissue mobilization    Manual therapy comments Performed after trigger point dry needling    Joint Mobilization Supine L first rib mobilization and depression    Soft tissue mobilization STM to L upper trap and pectoralis muscles to decrease tension and improve tissue mobility and blood flow              Trigger Point Dry Needling - 04/17/21 1324     Consent Given? Yes    Education Handout Provided Yes    Muscles Treated Head and Neck Upper trapezius    Muscles Treated Upper Quadrant Pectoralis major;Pectoralis minor    Dry Needling Comments Performed to L side only; performed in supine.  After performing upper trap pt reported mild dizziness.  Placed ice pack under neck and allowed pt increased time to rest and for symptoms to settle before performing pectoralis.    Upper Trapezius Response Twitch reponse elicited;Palpable increased muscle length    Pectoralis Major Response Twitch response elicited;Palpable increased muscle length    Pectoralis Minor Response Twitch response elicited;Palpable increased muscle length  PT Education - 04/17/21 1704     Education Details mitigate side effects with ice/heat, hydration, movement/stretches    Person(s) Educated Patient    Methods Explanation    Comprehension Verbalized understanding               PT Long Term Goals - 03/12/21 1301       PT LONG TERM GOAL #1   Title Pt will be independent with HEP    Time 6    Period Weeks    Status New    Target Date 04/23/21      PT LONG TERM GOAL #2   Title Pt will report >75% resolution of L pinky numbness    Time 6    Period Weeks    Status New    Target Date 04/23/21      PT LONG TERM GOAL #3   Title Pt will have increased L grip strength to at least 70 lbs to be more equal to R    Baseline 50-60 lbs on eval    Time 6    Period Weeks    Status New    Target Date 04/23/21      PT LONG TERM GOAL #4   Title Pt will  be able to lift at least 5 lbs overhead x10 without exacerbating symptoms    Baseline Within 28 sec of shoulder elevation will get increased symptoms    Time 6    Period Weeks    Status New    Target Date 04/23/21                   Plan - 04/17/21 1622     Clinical Impression Statement Able to perform dry needling of L upper trap and pectoralis muscles today; required one prolonged rest break after performing UT due to onset of dizziness.  Followed dry needling with quadruped active stretch of L pec and rhomboids.  Pt reporting no change in tingling after dry needling but did report decreased tension and decreased pain with overhead reaching.  Will continue to address and progress towards LTG.    Personal Factors and Comorbidities Age;Time since onset of injury/illness/exacerbation;Profession;Past/Current Experience    Examination-Activity Limitations Lift;Carry    Examination-Participation Restrictions Occupation    Stability/Clinical Decision Making Evolving/Moderate complexity    Rehab Potential Good    PT Frequency 2x / week   Pt requests to start after next week   PT Duration 6 weeks    PT Treatment/Interventions ADLs/Self Care Home Management;Electrical Stimulation;Moist Heat;Traction;Functional mobility training;Therapeutic activities;Therapeutic exercise;Neuromuscular re-education;Manual techniques;Patient/family education;Passive range of motion;Dry needling;Taping;Spinal Manipulations    PT Next Visit Plan Goals due 8/3, recert and add more visits?  How did he feel after dry needling, is he interested in more dry needling sessions?  Continue first rib mobs and show how to perform 1st rib mobilizations with strap. Give handout of pec stretch. He's improved with finding "neutral" neck position in sitting/standing -- we've worked into quadruped for added stability. Manual therapy for T-spine as indicated (focus on thoracic mobs next session) and stretching. Continue ulner nerve  gliding if pt tolerates. Continue neck stabilization exercises and shoulder/UE strengthening.    PT Home Exercise Plan Access Code: [email or web address]; Encouraged pt to work on his cervical neck retraction and holding it 10 sec every 30 minutes    Consulted and Agree with Plan of Care Patient             Patient will benefit from skilled therapeutic  intervention in order to improve the following deficits and impairments:  Increased fascial restricitons, Impaired UE functional use, Pain, Decreased mobility, Decreased strength, Impaired sensation, Postural dysfunction  Visit Diagnosis: Muscle weakness (generalized)  Chronic left shoulder pain  Cervicalgia  Other disturbances of skin sensation     Problem List Patient Active Problem List   Diagnosis Date Noted   Vitamin D deficiency disease 08/28/2019   Prediabetes 08/28/2019    Dierdre Highman, PT, DPT 04/17/21    5:05 PM    [facility] [address] [place], [place], [address] Phone: [phone]   Fax:  [phone]  Name: Herbert Davis MRN: [id number] Date of Birth: Aug 29, 1981

## 2021-04-22 ENCOUNTER — Ambulatory Visit: Payer: 59 | Attending: Neurology | Admitting: Physical Therapy

## 2021-04-22 ENCOUNTER — Other Ambulatory Visit: Payer: Self-pay

## 2021-04-22 DIAGNOSIS — M6281 Muscle weakness (generalized): Secondary | ICD-10-CM | POA: Diagnosis present

## 2021-04-22 DIAGNOSIS — R208 Other disturbances of skin sensation: Secondary | ICD-10-CM | POA: Insufficient documentation

## 2021-04-22 DIAGNOSIS — M542 Cervicalgia: Secondary | ICD-10-CM | POA: Insufficient documentation

## 2021-04-22 DIAGNOSIS — M25512 Pain in left shoulder: Secondary | ICD-10-CM | POA: Insufficient documentation

## 2021-04-22 DIAGNOSIS — G8929 Other chronic pain: Secondary | ICD-10-CM | POA: Diagnosis present

## 2021-04-22 DIAGNOSIS — M79645 Pain in left finger(s): Secondary | ICD-10-CM | POA: Diagnosis present

## 2021-04-22 NOTE — Therapy (Signed)
[facility] [address] [place], [place], [address] Phone: [phone]   Fax:  [phone]  Physical Therapy Treatment  Patient Details  Name: COLYN MIRON MRN: [id number] Date of Birth: 11-02-1980 Referring Provider (PT): Margette Fast   Encounter Date: 04/22/2021   PT End of Session - 04/22/21 1404     Visit Number 10    Number of Visits 12    Date for PT Re-Evaluation 04/23/21    Authorization Type [employer]    PT Start Time 1315    PT Stop Time 1400    PT Time Calculation (min) 45 min    Activity Tolerance Patient tolerated treatment well    Behavior During Therapy [facility] for tasks assessed/performed             Past Medical History:  Diagnosis Date   Pilonidal cyst    Prediabetes 08/28/2019   Vitamin D deficiency disease 08/28/2019    Past Surgical History:  Procedure Laterality Date   PILONIDAL CYST EXCISION N/A 12/11/2019   Procedure: CYST EXCISION PILONIDAL;  Surgeon: Aviva Signs, MD;  Location: [facility];  Service: General;  Laterality: N/A;   WISDOM TOOTH EXTRACTION      There were no vitals filed for this visit.   Subjective Assessment - 04/22/21 1319     Subjective 2 to 3/10 NT. Reports a little soreness after TPDN; interested in may be one or 2 more sessions. Has been stretching.    Patient Stated Goals Tingling to go away    Currently in Pain? Yes    Pain Score 3     Pain Orientation Left                               [facility] Adult PT Treatment/Exercise - 04/22/21 0001       Shoulder Exercises: Supine   Flexion Strengthening;Left;20 reps;AAROM    Flexion Limitations focus on decreasing neck muscle activation    ABduction Strengthening;Left;20 reps;AAROM    ABduction Limitations focus on decreasing neck muscle activation      Shoulder Exercises: Stretch   Wall Stretch - ABduction 1 rep;30 seconds    Wall Stretch - ABduction Limitations pec minor stretch with  hand on wall at 45, 60 and then 90 deg with rotation away from LUE to tolerance.      Manual Therapy   Manual Therapy Joint mobilization;Soft tissue mobilization;Passive ROM    Joint Mobilization Supine L first rib mobilization and depression; L scapular mobilization in all directions    Soft tissue mobilization STM to L upper trap and pectoralis muscles to decrease tension and improve tissue mobility and blood flow    Passive ROM shoulder abd to point of tension ~100 deg in supine                         PT Long Term Goals - 04/22/21 1403       PT LONG TERM GOAL #1   Title Pt will be independent with HEP    Time 6    Period Weeks    Status Achieved      PT LONG TERM GOAL #2   Title Pt will report >75% resolution of L pinky numbness    Baseline Currently 2 or 3/10 NT initially 6/10    Time 6    Period Weeks    Status Partially Met  PT LONG TERM GOAL #3   Title Pt will have increased L grip strength to at least 70 lbs to be more equal to R    Baseline 50-60 lbs on eval    Time 6    Period Weeks    Status On-going      PT LONG TERM GOAL #4   Title Pt will be able to lift at least 5 lbs overhead x10 without exacerbating symptoms    Baseline Within 28 sec of shoulder elevation will get increased symptoms    Time 6    Period Weeks    Status On-going                   Plan - 04/22/21 1401     Clinical Impression Statement Able to work with overhead shoulder motion in supine with </=4/10 N/T today with AAROM. Focused primarily on utilizing shoulder muscles and decreasing neck/upper trap overactivity with movement. Continued to work on stretching UT/pec, first rib mobilization and utilizing manual therapy.    Personal Factors and Comorbidities Age;Time since onset of injury/illness/exacerbation;Profession;Past/Current Experience    Examination-Activity Limitations Lift;Carry    Examination-Participation Restrictions Occupation    Stability/Clinical  Decision Making Evolving/Moderate complexity    Rehab Potential Good    PT Frequency 2x / week   Pt requests to start after next week   PT Duration 6 weeks    PT Treatment/Interventions ADLs/Self Care Home Management;Electrical Stimulation;Moist Heat;Traction;Functional mobility training;Therapeutic activities;Therapeutic exercise;Neuromuscular re-education;Manual techniques;Patient/family education;Passive range of motion;Dry needling;Taping;Spinal Manipulations    PT Next Visit Plan Goals due 8/3, recert and add more visits?  How did he feel after dry needling, is he interested in more dry needling sessions?  Continue first rib mobs and show how to perform 1st rib mobilizations with strap. Give handout of pec stretch. He's improved with finding "neutral" neck position in sitting/standing -- we've worked into quadruped for added stability. Manual therapy for T-spine as indicated (focus on thoracic mobs next session) and stretching. Continue ulner nerve gliding if pt tolerates. Continue neck stabilization exercises and shoulder/UE strengthening.    PT Home Exercise Plan Access Code: [email or web address]; Encouraged pt to work on his cervical neck retraction and holding it 10 sec every 30 minutes    Consulted and Agree with Plan of Care Patient             Patient will benefit from skilled therapeutic intervention in order to improve the following deficits and impairments:  Increased fascial restricitons, Impaired UE functional use, Pain, Decreased mobility, Decreased strength, Impaired sensation, Postural dysfunction  Visit Diagnosis: Muscle weakness (generalized)  Chronic left shoulder pain  Cervicalgia  Other disturbances of skin sensation  Pain in left finger(s)     Problem List Patient Active Problem List   Diagnosis Date Noted   Vitamin D deficiency disease 08/28/2019   Prediabetes 08/28/2019    Cariah Salatino April Ma L Brailee Riede PT, DPT 04/22/2021, 4:05 PM  [place] [address] [place], [place], [address] Phone: [phone]   Fax:  [phone]  Name: NUNO BRUBACHER MRN: [id number] Date of Birth: 1981-04-20

## 2021-04-24 ENCOUNTER — Other Ambulatory Visit: Payer: Self-pay

## 2021-04-24 ENCOUNTER — Ambulatory Visit: Payer: 59 | Admitting: Physical Therapy

## 2021-04-24 DIAGNOSIS — G8929 Other chronic pain: Secondary | ICD-10-CM

## 2021-04-24 DIAGNOSIS — M79645 Pain in left finger(s): Secondary | ICD-10-CM

## 2021-04-24 DIAGNOSIS — M6281 Muscle weakness (generalized): Secondary | ICD-10-CM

## 2021-04-24 DIAGNOSIS — M542 Cervicalgia: Secondary | ICD-10-CM

## 2021-04-24 DIAGNOSIS — R208 Other disturbances of skin sensation: Secondary | ICD-10-CM

## 2021-04-24 NOTE — Therapy (Signed)
[facility] [address] [place], [place], [address] Phone: [phone]   Fax:  [phone]  Physical Therapy Treatment and Re-Certification  Patient Details  Name: Herbert Davis MRN: [id number] Date of Birth: [birth year] Referring Provider (PT): Margette Fast   Encounter Date: 04/24/2021   PT End of Session - 04/24/21 1401     Visit Number 11    Number of Visits 24    Date for PT Re-Evaluation 06/05/21    Authorization Type [employer]    PT Start Time 1315    PT Stop Time 1400    PT Time Calculation (min) 45 min    Activity Tolerance Patient tolerated treatment well    Behavior During Therapy [facility] for tasks assessed/performed             Past Medical History:  Diagnosis Date   Pilonidal cyst    Prediabetes 08/28/2019   Vitamin D deficiency disease 08/28/2019    Past Surgical History:  Procedure Laterality Date   PILONIDAL CYST EXCISION N/A 12/11/2019   Procedure: CYST EXCISION PILONIDAL;  Surgeon: Aviva Signs, MD;  Location: [facility];  Service: General;  Laterality: N/A;   WISDOM TOOTH EXTRACTION      There were no vitals filed for this visit.   Subjective Assessment - 04/24/21 1322     Subjective Pt reports that his NT is a 5 today. Pt states it was a 2-3 NT when he woke up but when he drove here he felt that it started to flare up.    Patient Stated Goals Tingling to go away    Currently in Pain? Yes    Pain Score 5     Pain Location Arm    Pain Orientation Left                [facility] PT Assessment - 04/24/21 0001       Strength   Right Hand Grip (lbs) 80.9, 80.4, 78.2    Left Hand Grip (lbs) 59.9, 75.1, 58.6                           [facility] Adult PT Treatment/Exercise - 04/24/21 0001       Shoulder Exercises: Supine   External Rotation AROM;10 reps    Flexion AROM;10 reps      Shoulder Exercises: Prone   Retraction Strengthening;10 reps    Other Prone  Exercises Midtrap 2x10 with AAROM to reduce UT overactivity      Manual Therapy   Joint Mobilization Supine L first rib mobilization and depression; grade III cervical mobs;  thoracic grade III PA and lateral mobs (reduced symptoms with L to R lateral mobs); grade III posterior and inferior shoulder mobs    Soft tissue mobilization STM to L upper trap and pectoralis muscles to decrease tension and improve tissue mobility and blood flow                         PT Long Term Goals - 04/24/21 1402       PT LONG TERM GOAL #1   Title Pt will be independent with HEP    Time 6    Period Weeks    Status Achieved      PT LONG TERM GOAL #2   Title Pt will report >75% resolution of L pinky numbness    Baseline Currently 2 or 3/10 NT initially 6/10; 04/24/21  reports 50% resolution of NT (at times will get down to a 1)    Time 6    Period Weeks    Status Partially Met      PT LONG TERM GOAL #3   Title Pt will have increased L grip strength to at least 70 lbs to be more equal to R    Baseline 50-60 lbs on eval; 58 to 75 lbs on 04/24/21    Time 6    Period Weeks    Status On-going      PT LONG TERM GOAL #4   Title Pt will be able to lift at least 5 lbs overhead x10 without exacerbating symptoms    Baseline Within 28 sec of shoulder elevation will get increased symptoms    Time 6    Period Weeks    Status On-going                   Plan - 04/24/21 1551     Clinical Impression Statement Pt comes in with 5/10 NT this session. Improved after manual therapy. No increase or decrease in NT when performing cervical mobilizations or stretching of scalene. Increased symptoms given more tension/pressure on his pecs, decreased after lateral thoracic mobilizations and after pec stretching. Worked on strengthening mid trap in prone without compensating using UT. By end of session N/T decreased to 2-3. Upon rechecking goals, pt has improved grip strength from initial eval and reports  50% improvement but has not yet fully met his goals. He still has not been able to tolerate overhead lifting without increased intensity of symptoms. Pt with good potential to fully reach goals and would benefit from additional PT visits.    Personal Factors and Comorbidities Age;Time since onset of injury/illness/exacerbation;Profession;Past/Current Experience    Examination-Activity Limitations Lift;Carry    Examination-Participation Restrictions Occupation    Stability/Clinical Decision Making Evolving/Moderate complexity    Rehab Potential Good    PT Frequency 2x / week   Pt requests to start after next week   PT Duration 6 weeks    PT Treatment/Interventions ADLs/Self Care Home Management;Electrical Stimulation;Moist Heat;Traction;Functional mobility training;Therapeutic activities;Therapeutic exercise;Neuromuscular re-education;Manual techniques;Patient/family education;Passive range of motion;Dry needling;Taping;Spinal Manipulations    PT Next Visit Plan Continue first rib mobs and thoracic mobs. Manual as needed for pecs. Continue to strengthen mid trap, low trap and periscapular muscles to continue to decrease pt's L scapular forward tilt and protraction. Continue ulner nerve gliding if pt tolerates. Continue neck stabilization exercises as needed.    PT Home Exercise Plan Access Code: [email or web address]; Encouraged pt to work on his cervical neck retraction and holding it 10 sec every 30 minutes    Consulted and Agree with Plan of Care Patient             Patient will benefit from skilled therapeutic intervention in order to improve the following deficits and impairments:  Increased fascial restricitons, Impaired UE functional use, Pain, Decreased mobility, Decreased strength, Impaired sensation, Postural dysfunction  Visit Diagnosis: Muscle weakness (generalized)  Chronic left shoulder pain  Cervicalgia  Other disturbances of skin sensation  Pain in left finger(s)     Problem  List Patient Active Problem List   Diagnosis Date Noted   Vitamin D deficiency disease 08/28/2019   Prediabetes 08/28/2019    [name] April Ma L Rayme Bui PT, DPT 04/24/2021, 4:06 PM  [place] [address] [place], [place], [address] Phone: [phone]   Fax:  [phone]  Name:  Herbert Davis MRN: [id number] Date of Birth: 05-May-1981

## 2021-04-29 ENCOUNTER — Other Ambulatory Visit: Payer: Self-pay

## 2021-04-29 ENCOUNTER — Ambulatory Visit: Payer: 59 | Admitting: Physical Therapy

## 2021-04-29 DIAGNOSIS — R208 Other disturbances of skin sensation: Secondary | ICD-10-CM

## 2021-04-29 DIAGNOSIS — M542 Cervicalgia: Secondary | ICD-10-CM

## 2021-04-29 DIAGNOSIS — M6281 Muscle weakness (generalized): Secondary | ICD-10-CM | POA: Diagnosis not present

## 2021-04-29 DIAGNOSIS — G8929 Other chronic pain: Secondary | ICD-10-CM

## 2021-04-29 NOTE — Patient Instructions (Signed)
To practice lifting your left arm without the upper shoulder muscle taking over:   -Stand or sit in front of a mirror  -Place your Right hand on top of your left shoulder  -Begin to slowly raise your left arm up and watch in the mirror for any shoulder shrugging.  If you begin to shrug, stop, lower your shoulder and try lifting your arm again.    -Stop at shoulder level and lower slowly.   -Repeat 10 times

## 2021-04-30 NOTE — Therapy (Signed)
[facility] [address] [place], [place], [address] Phone: [phone]   Fax:  [phone]  Physical Therapy Treatment  Patient Details  Name: Herbert Davis MRN: [id number] Date of Birth: January 14, 1981 Referring Provider (PT): Stephanie Acre   Encounter Date: 04/29/2021   PT End of Session - 04/29/21 1319     Visit Number 12    Number of Visits 24    Date for PT Re-Evaluation 06/05/21    Authorization Type [employer]    PT Start Time 1317    PT Stop Time 1400    PT Time Calculation (min) 43 min    Activity Tolerance Patient tolerated treatment well    Behavior During Therapy [facility] for tasks assessed/performed             Past Medical History:  Diagnosis Date   Pilonidal cyst    Prediabetes 08/28/2019   Vitamin D deficiency disease 08/28/2019    Past Surgical History:  Procedure Laterality Date   PILONIDAL CYST EXCISION N/A 12/11/2019   Procedure: CYST EXCISION PILONIDAL;  Surgeon: Franky Macho, MD;  Location: [facility];  Service: General;  Laterality: N/A;   WISDOM TOOTH EXTRACTION      There were no vitals filed for this visit.   Subjective Assessment - 04/29/21 1326     Subjective Reports NT 3/10 today after driving.  Has noticed improved range in LUE but still causes some pain when reaching up.  Still tightens up with driving, typing and use of LUE.    Patient Stated Goals Tingling to go away    Currently in Pain? Yes    Pain Score 3     Pain Location Arm    Pain Orientation Left               [facility] Adult PT Treatment/Exercise - 04/29/21 1406       Neuro Re-ed    Neuro Re-ed Details  In supine with LUE supported on mat and pillow, PT had pt initiate LUE ABD.  Pt immediately activates and completes initial ABD with strong upper trap activation.  Remainder of session focused on providing tactile and verbal feedback while pt performed LUE ABD in supported position; pt unable to initiate without  shoulder/scapular elevation so cued pt to fully relax LUE and allow therapist to provide active assisted ROM with feedback.  Pt required multiple attempts and mod-max cues to decrease over use of UT and for more normal scapulo-humeral rhythm.      Modalities   Modalities Cryotherapy      Cryotherapy   Number Minutes Cryotherapy 15 Minutes    Cryotherapy Location Cervical;Shoulder    Type of Cryotherapy Ice pack              Trigger Point Dry Needling - 04/29/21 1326     Consent Given? Yes    Education Handout Provided Previously provided    Muscles Treated Head and Neck Upper trapezius    Muscles Treated Upper Quadrant Pectoralis major;Pectoralis minor    Dry Needling Comments --   Performed to L side only in supine, no dizziness after performing UT today.  No twitch or cramping noted in Pec today   Upper Trapezius Response Twitch reponse elicited;Palpable increased muscle length    Pectoralis Major Response Palpable increased muscle length    Pectoralis Minor Response Palpable increased muscle length                  PT Education -  04/29/21 1625     Education Details adding visits, raising left arm and using mirror and R hand for feedback    Person(s) Educated Patient    Methods Explanation;Demonstration    Comprehension Verbalized understanding                 PT Long Term Goals - 04/29/21 1615       PT LONG TERM GOAL #1   Title Pt will be independent with HEP    Time 6    Period Weeks    Status Revised    Target Date 06/05/21      PT LONG TERM GOAL #2   Title Pt will report >75% resolution of L pinky numbness    Baseline Currently 2 or 3/10 NT initially 6/10; 04/24/21 reports 50% resolution of NT (at times will get down to a 1)    Time 6    Period Weeks    Status Revised    Target Date 06/05/21      PT LONG TERM GOAL #3   Title Pt will have increased L grip strength to at least 70 lbs to be more equal to R    Baseline 50-60 lbs on eval; 58 to  75 lbs on 04/24/21    Time 6    Period Weeks    Status On-going    Target Date 06/05/21      PT LONG TERM GOAL #4   Title Pt will be able to lift at least 5 lbs overhead x10 without exacerbating symptoms    Baseline Within 28 sec of shoulder elevation will get increased symptoms    Time 6    Period Weeks    Status Revised    Target Date 06/05/21                   Plan - 04/29/21 1616     Clinical Impression Statement Pt reporting 3/10 NT at beginning and end of session and continues to demonstrate significant improvement in L shoulder pain free ROM after dry needling and NMR.  Pt educated on how to perform arm raises with use of visual and tactile feedback.  More visits scheduled in order to continue to address.  No dizziness reported today with dry needling and significant improvement in tissue mobility noted in L pec.    Personal Factors and Comorbidities Age;Time since onset of injury/illness/exacerbation;Profession;Past/Current Experience    Examination-Activity Limitations Lift;Carry    Examination-Participation Restrictions Occupation    Stability/Clinical Decision Making Evolving/Moderate complexity    Rehab Potential Good    PT Frequency 2x / week   Pt requests to start after next week   PT Duration 6 weeks    PT Treatment/Interventions ADLs/Self Care Home Management;Electrical Stimulation;Moist Heat;Traction;Functional mobility training;Therapeutic activities;Therapeutic exercise;Neuromuscular re-education;Manual techniques;Patient/family education;Passive range of motion;Dry needling;Taping;Spinal Manipulations    PT Next Visit Plan How is upper trap?  Need one more DN to upper trap?  continue NMR to decrease overactivation of upper trap.  Postural exercises.    PT Home Exercise Plan Access Code: [email or web address]; Encouraged pt to work on his cervical neck retraction and holding it 10 sec every 30 minutes    Consulted and Agree with Plan of Care Patient              Patient will benefit from skilled therapeutic intervention in order to improve the following deficits and impairments:  Increased fascial restricitons, Impaired UE functional use, Pain, Decreased mobility, Decreased strength, Impaired sensation, Postural dysfunction  Visit Diagnosis: Cervicalgia  Muscle weakness (generalized)  Chronic left shoulder pain  Other disturbances of skin sensation     Problem List Patient Active Problem List   Diagnosis Date Noted   Vitamin D deficiency disease 08/28/2019   Prediabetes 08/28/2019    Dierdre Highman, PT, DPT 04/30/21    11:26 AM    [facility] [address] [place], [place], [address] Phone: [phone]   Fax:  [phone]  Name: Herbert Davis MRN: [id number] Date of Birth: [birth year]

## 2021-05-01 ENCOUNTER — Ambulatory Visit: Payer: 59 | Admitting: Physical Therapy

## 2021-05-01 ENCOUNTER — Other Ambulatory Visit: Payer: Self-pay

## 2021-05-01 DIAGNOSIS — M6281 Muscle weakness (generalized): Secondary | ICD-10-CM | POA: Diagnosis not present

## 2021-05-01 DIAGNOSIS — R208 Other disturbances of skin sensation: Secondary | ICD-10-CM

## 2021-05-01 DIAGNOSIS — M25512 Pain in left shoulder: Secondary | ICD-10-CM

## 2021-05-01 DIAGNOSIS — G8929 Other chronic pain: Secondary | ICD-10-CM

## 2021-05-01 DIAGNOSIS — M542 Cervicalgia: Secondary | ICD-10-CM

## 2021-05-01 NOTE — Therapy (Signed)
[facility] [address] [place], [place], [address] Phone: [phone]   Fax:  [phone]  Physical Therapy Treatment  Patient Details  Name: Herbert Davis MRN: [id number] Date of Birth: May 07, 1981 Referring Provider (PT): Stephanie Acre   Encounter Date: 05/01/2021   PT End of Session - 05/01/21 1323     Visit Number 13    Number of Visits 24    Date for PT Re-Evaluation 06/05/21    Authorization Type [employer]    PT Start Time 1320    PT Stop Time 1400    PT Time Calculation (min) 40 min    Activity Tolerance Patient tolerated treatment well    Behavior During Therapy [facility] for tasks assessed/performed             Past Medical History:  Diagnosis Date   Pilonidal cyst    Prediabetes 08/28/2019   Vitamin D deficiency disease 08/28/2019    Past Surgical History:  Procedure Laterality Date   PILONIDAL CYST EXCISION N/A 12/11/2019   Procedure: CYST EXCISION PILONIDAL;  Surgeon: Franky Macho, MD;  Location: [facility];  Service: General;  Laterality: N/A;   WISDOM TOOTH EXTRACTION      There were no vitals filed for this visit.   Subjective Assessment - 05/01/21 1327     Subjective 4/10 NT today; more stress today.  Had a hard time doing the arm raises watching arm in mirror and not using UT.    Patient Stated Goals Tingling to go away    Currently in Pain? Yes                [facility] Adult PT Treatment/Exercise - 05/01/21 1547       Shoulder Exercises: Supine   Flexion AROM;Right;Left;Both;5 reps;10 reps;Limitations    Flexion Limitations NMR with therapist providing feedback to decrease upper trap overactivation at initiation of flexion.  Began with RUE x 5 reps for pain free activation/non-involved side, changed to involved side x 5 reps and then bilat x 5 reps.      Manual Therapy   Manual Therapy Myofascial release;Soft tissue mobilization;Joint mobilization    Manual therapy comments  Transitioned to supine to perform after dry needling    Joint Mobilization Supine L first rib mobilization and depression    Soft tissue mobilization STM to L upper trap to decrease tension and improve tissue mobility and blood flow    Myofascial Release Bilat suboccipital release with slight traction while instructing pt in 4 second box breathing to stimulate parasympathetic activation              Trigger Point Dry Needling - 05/01/21 1405     Consent Given? Yes    Education Handout Provided Previously provided    Muscles Treated Head and Neck Upper trapezius;Cervical multifidi;Splenius capitus;Semispinalis capitus    Dry Needling Comments Performed in prone today to L side only    Upper Trapezius Response Twitch reponse elicited;Palpable increased muscle length    Splenius capitus Response Twitch reponse elicited;Palpable increased muscle length    Semispinalis capitus Response Twitch reponse elicited;Palpable increased muscle length    Cervical multifidi Response Twitch reponse elicited;Palpable increased muscle length                  PT Education - 05/01/21 1538     Education Details changing one appointment to ortho clinic to address scalenes, discussed hand position when driving - pt states hand lower on wheel is worse  Person(s) Educated Patient    Methods Explanation    Comprehension Verbalized understanding                 PT Long Term Goals - 04/29/21 1615       PT LONG TERM GOAL #1   Title Pt will be independent with HEP    Time 6    Period Weeks    Status Revised    Target Date 06/05/21      PT LONG TERM GOAL #2   Title Pt will report >75% resolution of L pinky numbness    Baseline Currently 2 or 3/10 NT initially 6/10; 04/24/21 reports 50% resolution of NT (at times will get down to a 1)    Time 6    Period Weeks    Status Revised    Target Date 06/05/21      PT LONG TERM GOAL #3   Title Pt will have increased L grip strength to at  least 70 lbs to be more equal to R    Baseline 50-60 lbs on eval; 58 to 75 lbs on 04/24/21    Time 6    Period Weeks    Status On-going    Target Date 06/05/21      PT LONG TERM GOAL #4   Title Pt will be able to lift at least 5 lbs overhead x10 without exacerbating symptoms    Baseline Within 28 sec of shoulder elevation will get increased symptoms    Time 6    Period Weeks    Status Revised    Target Date 06/05/21                   Plan - 05/01/21 1530     Clinical Impression Statement Pt reporting increased pain and tension today with increased stress.  Addressed L cervical paraspinals and upper trap - pt experienced mild dizziness with dry needling but symptoms settled upon sitting upright.  In supine today pt demonstrated improved ability to initiate LUE flexion without over use of upper trap and shoulder elevation but pt reported increased difficulty and weakness lifting LUE.  Pt reporting numbness and tingling decreased to 2/10 at end of session and reported improved shoulder ROM.  Set pt up with one dry needling session at ortho clinic to address scalene muscles on L.    Personal Factors and Comorbidities Age;Time since onset of injury/illness/exacerbation;Profession;Past/Current Experience    Examination-Activity Limitations Lift;Carry    Examination-Participation Restrictions Occupation    Stability/Clinical Decision Making Evolving/Moderate complexity    Rehab Potential Good    PT Frequency 2x / week   Pt requests to start after next week   PT Duration 6 weeks    PT Treatment/Interventions ADLs/Self Care Home Management;Electrical Stimulation;Moist Heat;Traction;Functional mobility training;Therapeutic activities;Therapeutic exercise;Neuromuscular re-education;Manual techniques;Patient/family education;Passive range of motion;Dry needling;Taping;Spinal Manipulations    PT Next Visit Plan continue NMR in supine to decrease overactivation of upper trap and progress to more  demanding postures.  Stress reduction with breathing.  Postural exercises.  [address] - DN of scalenes L side    PT Home Exercise Plan Access Code: [email or web address]; Encouraged pt to work on his cervical neck retraction and holding it 10 sec every 30 minutes    Consulted and Agree with Plan of Care Patient             Patient will benefit from skilled therapeutic intervention in order to improve the following deficits and impairments:  Increased fascial restricitons,  Impaired UE functional use, Pain, Decreased mobility, Decreased strength, Impaired sensation, Postural dysfunction  Visit Diagnosis: Cervicalgia  Muscle weakness (generalized)  Chronic left shoulder pain  Other disturbances of skin sensation     Problem List Patient Active Problem List   Diagnosis Date Noted   Vitamin D deficiency disease 08/28/2019   Prediabetes 08/28/2019    Dierdre Highman, PT, DPT 05/01/21    3:53 PM    [facility] [address] [place], [place], [address] Phone: [phone]   Fax:  [phone]  Name: Herbert Davis MRN: [id number] Date of Birth: September 30, 1980

## 2021-05-06 ENCOUNTER — Other Ambulatory Visit: Payer: Self-pay

## 2021-05-06 ENCOUNTER — Ambulatory Visit: Payer: 59 | Admitting: Physical Therapy

## 2021-05-06 DIAGNOSIS — G8929 Other chronic pain: Secondary | ICD-10-CM

## 2021-05-06 DIAGNOSIS — M542 Cervicalgia: Secondary | ICD-10-CM

## 2021-05-06 DIAGNOSIS — R208 Other disturbances of skin sensation: Secondary | ICD-10-CM

## 2021-05-06 DIAGNOSIS — M25512 Pain in left shoulder: Secondary | ICD-10-CM

## 2021-05-06 DIAGNOSIS — M6281 Muscle weakness (generalized): Secondary | ICD-10-CM

## 2021-05-06 NOTE — Therapy (Signed)
[facility] [address] [place], [place], [address] Phone: [phone]   Fax:  [phone]  Physical Therapy Treatment  Patient Details  Name: Herbert Davis MRN: [id number] Date of Birth: Aug 16, 1981 Referring Provider (PT): Stephanie Acre   Encounter Date: 05/06/2021   PT End of Session - 05/06/21 0940     Visit Number 14    Number of Visits 24    Date for PT Re-Evaluation 06/05/21    Authorization Type [employer]    PT Start Time [phone]    PT Stop Time 1016    PT Time Calculation (min) 38 min    Activity Tolerance Patient tolerated treatment well    Behavior During Therapy [facility] for tasks assessed/performed             Past Medical History:  Diagnosis Date   Pilonidal cyst    Prediabetes 08/28/2019   Vitamin D deficiency disease 08/28/2019    Past Surgical History:  Procedure Laterality Date   PILONIDAL CYST EXCISION N/A 12/11/2019   Procedure: CYST EXCISION PILONIDAL;  Surgeon: Franky Macho, MD;  Location: [facility];  Service: General;  Laterality: N/A;   WISDOM TOOTH EXTRACTION      There were no vitals filed for this visit.   Subjective Assessment - 05/06/21 0940     Subjective 2/10 today.  Had some times this past weekend when it was as low as a 1 or 0.    Patient Stated Goals Tingling to go away    Currently in Pain? Yes    Pain Score 2     Pain Location Neck               [facility] Adult PT Treatment/Exercise - 05/06/21 1210       Exercises   Exercises Other Exercises    Other Exercises  Pt having increased difficulty with scaption on the wall; pt continues to activate with upper trap.  Changed to supine scaption, diagonal resisted pulls with green theraband.  Performed bilaterally.  Changed on HEP.              Trigger Point Dry Needling - 05/06/21 6378     Consent Given? Yes    Education Handout Provided Previously provided    Muscles Treated Head and Neck Upper  trapezius;Cervical multifidi;Splenius capitus;Semispinalis capitus    Dry Needling Comments Performed in prone today to L side only.  No dizziness today.  Mild tingling noted when performing dry needling to cervical paraspinals    Upper Trapezius Response Twitch reponse elicited;Palpable increased muscle length    Splenius capitus Response Palpable increased muscle length    Semispinalis capitus Response Palpable increased muscle length    Cervical multifidi Response Palpable increased muscle length                  PT Education - 05/06/21 1208     Education Details revised HEP to perform scaption in supine for increased support and will progress back to standing and performing at wall.    Person(s) Educated Patient    Methods Explanation;Demonstration;Handout    Comprehension Verbalized understanding;Returned demonstration                 PT Long Term Goals - 04/29/21 1615       PT LONG TERM GOAL #1   Title Pt will be independent with HEP    Time 6    Period Weeks    Status Revised  Target Date 06/05/21      PT LONG TERM GOAL #2   Title Pt will report >75% resolution of L pinky numbness    Baseline Currently 2 or 3/10 NT initially 6/10; 04/24/21 reports 50% resolution of NT (at times will get down to a 1)    Time 6    Period Weeks    Status Revised    Target Date 06/05/21      PT LONG TERM GOAL #3   Title Pt will have increased L grip strength to at least 70 lbs to be more equal to R    Baseline 50-60 lbs on eval; 58 to 75 lbs on 04/24/21    Time 6    Period Weeks    Status On-going    Target Date 06/05/21      PT LONG TERM GOAL #4   Title Pt will be able to lift at least 5 lbs overhead x10 without exacerbating symptoms    Baseline Within 28 sec of shoulder elevation will get increased symptoms    Time 6    Period Weeks    Status Revised    Target Date 06/05/21                   Plan - 05/06/21 1213     Clinical Impression Statement Pt  demonstrates improved tissue mobility and tissue change in L upper trap with decreased symptoms of tingling and dizziness.  When addressing cervical paraspinals pt continues to experience symptoms of tingling but symptoms are decreasing overall.  Downgraded scaption to supine due to overuse of upper trap to perform; will progress back to standing.    Personal Factors and Comorbidities Age;Time since onset of injury/illness/exacerbation;Profession;Past/Current Experience    Examination-Activity Limitations Lift;Carry    Examination-Participation Restrictions Occupation    Stability/Clinical Decision Making Evolving/Moderate complexity    Rehab Potential Good    PT Frequency 2x / week   Pt requests to start after next week   PT Duration 6 weeks    PT Treatment/Interventions ADLs/Self Care Home Management;Electrical Stimulation;Moist Heat;Traction;Functional mobility training;Therapeutic activities;Therapeutic exercise;Neuromuscular re-education;Manual techniques;Patient/family education;Passive range of motion;Dry needling;Taping;Spinal Manipulations    PT Next Visit Plan 1st rib self mob with strap.  continue NMR in supine to decrease overactivation of upper trap and progress to more demanding postures.  Stress reduction with breathing.  Postural exercises - progress back to standing for postural exercises with resistance.  [address] - DN of scalenes L side    PT Home Exercise Plan Access Code: [email or web address]; Encouraged pt to work on his cervical neck retraction and holding it 10 sec every 30 minutes    Consulted and Agree with Plan of Care Patient             Patient will benefit from skilled therapeutic intervention in order to improve the following deficits and impairments:  Increased fascial restricitons, Impaired UE functional use, Pain, Decreased mobility, Decreased strength, Impaired sensation, Postural dysfunction  Visit Diagnosis: Cervicalgia  Muscle weakness (generalized)  Chronic  left shoulder pain  Other disturbances of skin sensation     Problem List Patient Active Problem List   Diagnosis Date Noted   Vitamin D deficiency disease 08/28/2019   Prediabetes 08/28/2019    Dierdre Highman, PT, DPT 05/06/21    12:19 PM    [facility] [address] [place], [place], [address] Phone: [phone]   Fax:  [phone]  Name: RICARD FAULKNER MRN:  [id number] Date of Birth: July 28, 1981

## 2021-05-06 NOTE — Patient Instructions (Signed)
Access Code: [email or web address] URL: [email or web address] Date: 05/06/2021 Prepared by: Bufford Lope  Exercises Upper Trap Stretch with Belt - 1 x daily - 7 x weekly - 1 sets - 10 reps Standing Pec Stretch at Wall - 1 x daily - 7 x weekly - 2 sets - 30 second hold hold Prone Middle Trapezius Strengthening on Swiss Ball - 1 x daily - 7 x weekly - 2 sets - 10 reps Shoulder External Rotation and Scapular Retraction with Resistance - 1 x daily - 7 x weekly - 3 sets - 10 reps Standing Bicep Curls Supinated with Dumbbells - 1 x daily - 7 x weekly - 3 sets - 10 reps Ulnar Nerve Mobilization - Low Level - 1 x daily - 7 x weekly - 1 sets - 10 reps Seated Shoulder Diagonal Pulls with Resistance - 1 x daily - 7 x weekly - 2 sets - 10 reps

## 2021-05-08 ENCOUNTER — Ambulatory Visit: Payer: 59

## 2021-05-08 ENCOUNTER — Other Ambulatory Visit: Payer: Self-pay

## 2021-05-08 DIAGNOSIS — G8929 Other chronic pain: Secondary | ICD-10-CM

## 2021-05-08 DIAGNOSIS — M542 Cervicalgia: Secondary | ICD-10-CM

## 2021-05-08 DIAGNOSIS — M6281 Muscle weakness (generalized): Secondary | ICD-10-CM | POA: Diagnosis not present

## 2021-05-08 NOTE — Therapy (Signed)
[facility] [address] [place], [place], [address] Phone: [phone]   Fax:  [phone]  Physical Therapy Treatment  Patient Details  Name: Herbert Davis MRN: [id number] Date of Birth: November 12, 1980 Referring Provider (PT): Stephanie Acre   Encounter Date: 05/08/2021   PT End of Session - 05/08/21 1527     Visit Number 15    Number of Visits 24    Date for PT Re-Evaluation 06/05/21    Authorization Type [employer]    PT Start Time 1445    PT Stop Time 1525    PT Time Calculation (min) 40 min    Activity Tolerance Patient tolerated treatment well    Behavior During Therapy [facility] for tasks assessed/performed             Past Medical History:  Diagnosis Date   Pilonidal cyst    Prediabetes 08/28/2019   Vitamin D deficiency disease 08/28/2019    Past Surgical History:  Procedure Laterality Date   PILONIDAL CYST EXCISION N/A 12/11/2019   Procedure: CYST EXCISION PILONIDAL;  Surgeon: Franky Macho, MD;  Location: [facility];  Service: General;  Laterality: N/A;   WISDOM TOOTH EXTRACTION      There were no vitals filed for this visit.   Subjective Assessment - 05/08/21 1448     Subjective 1/10 pain today.  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  Cannot sleep on L side.  L pinkie tingling now occasioal vs. constant    Patient Stated Goals Tingling to go away    Currently in Pain? Yes    Pain Score 1     Pain Location Arm    Pain Orientation Left    Pain Descriptors / Indicators Tingling    Pain Type Chronic pain                               [facility] Adult PT Treatment/Exercise - 05/08/21 0001       Manual Therapy   Manual Therapy Soft tissue mobilization;Other (comment)    Other Manual Therapy STM to L pec minor, first rib mob in supine, manual resisted scapular elev, dep, protrat, retract, light manual resistance 10x ea. direction, cervical traction x5 min.                     PT Education - 05/08/21 1633     Education Details Instructed to perform exercises in mirror and correct for shoulder elevation when performing OH tasks    Person(s) Educated Patient    Methods Explanation;Demonstration    Comprehension Verbalized understanding;Returned demonstration                 PT Long Term Goals - 04/29/21 1615       PT LONG TERM GOAL #1   Title Pt will be independent with HEP    Time 6    Period Weeks    Status Revised    Target Date 06/05/21      PT LONG TERM GOAL #2   Title Pt will report >75% resolution of L pinky numbness    Baseline Currently 2 or 3/10 NT initially 6/10; 04/24/21 reports 50% resolution of NT (at times will get down to a 1)    Time 6    Period Weeks    Status Revised    Target Date 06/05/21      PT LONG TERM  GOAL #3   Title Pt will have increased L grip strength to at least 70 lbs to be more equal to R    Baseline 50-60 lbs on eval; 58 to 75 lbs on 04/24/21    Time 6    Period Weeks    Status On-going    Target Date 06/05/21      PT LONG TERM GOAL #4   Title Pt will be able to lift at least 5 lbs overhead x10 without exacerbating symptoms    Baseline Within 28 sec of shoulder elevation will get increased symptoms    Time 6    Period Weeks    Status Revised    Target Date 06/05/21                   Plan - 05/08/21 1642     Clinical Impression Statement Todays session focused on STM to L scalenes, pec minor and upper trap, incorporating cervical traction as tolerated.  Performed manual scapular resistance to all 4 planes and manually stabilized scapula through sidelie abduction.  Instructed patient to perform exercises in mirror to identify and self correct scapular elevation which he was able to demo back.  Full AROM noted in shoulder, elevated first rib suspected with soft tissue irritation main limitation to further manual interventions.    Personal Factors and Comorbidities  Age;Time since onset of injury/illness/exacerbation;Profession;Past/Current Experience    Examination-Activity Limitations Lift;Carry    Examination-Participation Restrictions Occupation    Stability/Clinical Decision Making Evolving/Moderate complexity    Rehab Potential Good    PT Frequency 2x / week   Pt requests to start after next week   PT Duration 6 weeks    PT Treatment/Interventions ADLs/Self Care Home Management;Electrical Stimulation;Moist Heat;Traction;Functional mobility training;Therapeutic activities;Therapeutic exercise;Neuromuscular re-education;Manual techniques;Patient/family education;Passive range of motion;Dry needling;Taping;Spinal Manipulations    PT Next Visit Plan 1st rib self mob with strap.  continue NMR in supine to decrease overactivation of upper trap and progress to more demanding postures.  Stress reduction with breathing.  Postural exercises - progress back to standing for postural exercises with resistance.  [address] - DN of scalenes L side, assess benefit of last session's interventions    PT Home Exercise Plan Access Code: [email or web address]; Encouraged pt to work on his cervical neck retraction and holding it 10 sec every 30 minutes    Consulted and Agree with Plan of Care Patient             Patient will benefit from skilled therapeutic intervention in order to improve the following deficits and impairments:  Increased fascial restricitons, Impaired UE functional use, Pain, Decreased mobility, Decreased strength, Impaired sensation, Postural dysfunction  Visit Diagnosis: Cervicalgia  Muscle weakness (generalized)  Chronic left shoulder pain     Problem List Patient Active Problem List   Diagnosis Date Noted   Vitamin D deficiency disease 08/28/2019   Prediabetes 08/28/2019    Hildred Laser PT 05/08/2021, 4:52 PM  [facility] [address] [place], [place], [address] Phone:  [phone]   Fax:  [phone]  Name: CASIMIR BARCELLOS MRN: [id number] Date of Birth: [birth year]

## 2021-05-13 ENCOUNTER — Other Ambulatory Visit: Payer: Self-pay

## 2021-05-13 ENCOUNTER — Ambulatory Visit: Payer: 59

## 2021-05-13 DIAGNOSIS — M25512 Pain in left shoulder: Secondary | ICD-10-CM

## 2021-05-13 DIAGNOSIS — M6281 Muscle weakness (generalized): Secondary | ICD-10-CM | POA: Diagnosis not present

## 2021-05-13 DIAGNOSIS — M542 Cervicalgia: Secondary | ICD-10-CM

## 2021-05-13 DIAGNOSIS — G8929 Other chronic pain: Secondary | ICD-10-CM

## 2021-05-13 NOTE — Therapy (Signed)
[facility] [address] [place], [place], [address] Phone: [phone]   Fax:  [phone]  Physical Therapy Treatment  Patient Details  Name: Herbert Davis MRN: [id number] Date of Birth: 1981/09/10 Referring Provider (PT): Stephanie Acre   Encounter Date: 05/13/2021   PT End of Session - 05/13/21 1100     Visit Number 16    Number of Visits 24    Date for PT Re-Evaluation 06/05/21    Authorization Type [employer]    PT Start Time 1015    PT Stop Time 1100    PT Time Calculation (min) 45 min    Activity Tolerance Patient tolerated treatment well    Behavior During Therapy [facility] for tasks assessed/performed             Past Medical History:  Diagnosis Date   Pilonidal cyst    Prediabetes 08/28/2019   Vitamin D deficiency disease 08/28/2019    Past Surgical History:  Procedure Laterality Date   PILONIDAL CYST EXCISION N/A 12/11/2019   Procedure: CYST EXCISION PILONIDAL;  Surgeon: Franky Macho, MD;  Location: [facility];  Service: General;  Laterality: N/A;   WISDOM TOOTH EXTRACTION      There were no vitals filed for this visit.   Subjective Assessment - 05/13/21 1017     Subjective Did well following last session, feels last session approach was beneficial.    Patient Stated Goals Tingling to go away                               [facility] Adult PT Treatment/Exercise - 05/13/21 0001       Neck Exercises: Stretches   Other Neck Stretches scalene stertches, 30s to ea. head, 3 total stertches      Shoulder Exercises: Sidelying   ABduction Left;15 reps;Limitations    ABduction Limitations manual facilitaton of L scapula to capture proper mechanics    Other Sidelying Exercises 4 way resisted scapular prottraction, retraction, elevation and depression, 15x ea.      Manual Therapy   Manual Therapy Soft tissue mobilization;Joint mobilization;Taping    Other Manual Therapy STM to L pec  minor, first rib mob in supine, manual resisted scapular elev, dep, protrat, retract, light manual resistance 15x ea. direction, cervical traction x5 min., joint mobs to L shoulder in inferior and posterior directions to promote abduction    Kinesiotex Inhibit Muscle   L upper trap inhibiiton                        PT Long Term Goals - 04/29/21 1615       PT LONG TERM GOAL #1   Title Pt will be independent with HEP    Time 6    Period Weeks    Status Revised    Target Date 06/05/21      PT LONG TERM GOAL #2   Title Pt will report >75% resolution of L pinky numbness    Baseline Currently 2 or 3/10 NT initially 6/10; 04/24/21 reports 50% resolution of NT (at times will get down to a 1)    Time 6    Period Weeks    Status Revised    Target Date 06/05/21      PT LONG TERM GOAL #3   Title Pt will have increased L grip strength to at least 70 lbs to be more equal to R  Baseline 50-60 lbs on eval; 58 to 75 lbs on 04/24/21    Time 6    Period Weeks    Status On-going    Target Date 06/05/21      PT LONG TERM GOAL #4   Title Pt will be able to lift at least 5 lbs overhead x10 without exacerbating symptoms    Baseline Within 28 sec of shoulder elevation will get increased symptoms    Time 6    Period Weeks    Status Revised    Target Date 06/05/21                   Plan - 05/13/21 1100     Clinical Impression Statement todays session continued to focus on L upper trap tightness and general L sacauplar mobility, tight pec minor and weak scapular stabilizers noted.  AROM L shoulder WNL, aplied taping to inhibit L upper trap.  L first rib elevated and scalenes tight on L    Personal Factors and Comorbidities Age;Time since onset of injury/illness/exacerbation;Profession;Past/Current Experience    Examination-Activity Limitations Lift;Carry    Examination-Participation Restrictions Occupation    Stability/Clinical Decision Making Evolving/Moderate complexity     Rehab Potential Good    PT Frequency 2x / week   Pt requests to start after next week   PT Duration 6 weeks    PT Treatment/Interventions ADLs/Self Care Home Management;Electrical Stimulation;Moist Heat;Traction;Functional mobility training;Therapeutic activities;Therapeutic exercise;Neuromuscular re-education;Manual techniques;Patient/family education;Passive range of motion;Dry needling;Taping;Spinal Manipulations    PT Next Visit Plan 1st rib self mob, continue NMR in supine to decrease overactivation of upper trap and progress to more demanding postures.  Stress reduction with breathing.  Postural exercises - progress back to standing for postural exercises with resistance.  8/[address] - DN of scalenes L side, scapular stabilization tasks    PT Home Exercise Plan Access Code: [email or web address]; Encouraged pt to work on his cervical neck retraction and holding it 10 sec every 30 minutes    Consulted and Agree with Plan of Care Patient             Patient will benefit from skilled therapeutic intervention in order to improve the following deficits and impairments:  Increased fascial restricitons, Impaired UE functional use, Pain, Decreased mobility, Decreased strength, Impaired sensation, Postural dysfunction  Visit Diagnosis: Cervicalgia  Muscle weakness (generalized)  Chronic left shoulder pain     Problem List Patient Active Problem List   Diagnosis Date Noted   Vitamin D deficiency disease 08/28/2019   Prediabetes 08/28/2019    Hildred Laser 05/13/2021, 11:17 AM  [facility] [address] [place], [place], [address] Phone: [phone]   Fax:  [phone]  Name: Herbert Davis MRN: [id number] Date of Birth: Dec 09, 1980

## 2021-05-15 ENCOUNTER — Other Ambulatory Visit: Payer: Self-pay

## 2021-05-15 ENCOUNTER — Ambulatory Visit: Payer: 59 | Admitting: Physical Therapy

## 2021-05-15 DIAGNOSIS — M6281 Muscle weakness (generalized): Secondary | ICD-10-CM | POA: Diagnosis not present

## 2021-05-15 DIAGNOSIS — G8929 Other chronic pain: Secondary | ICD-10-CM

## 2021-05-15 DIAGNOSIS — M542 Cervicalgia: Secondary | ICD-10-CM

## 2021-05-15 DIAGNOSIS — R208 Other disturbances of skin sensation: Secondary | ICD-10-CM

## 2021-05-15 NOTE — Patient Instructions (Signed)
Access Code: [email or web address] URL: [email or web address] Date: 05/06/2021 Prepared by: Zoejane Gaulin  Exercises Upper Trap Stretch with Belt - 1 x daily - 7 x weekly - 1 sets - 10 reps Standing Pec Stretch at Wall - 1 x daily - 7 x weekly - 2 sets - 30 second hold hold Prone Middle Trapezius Strengthening on Swiss Ball - 1 x daily - 7 x weekly - 2 sets - 10 reps Shoulder External Rotation and Scapular Retraction with Resistance - 1 x daily - 7 x weekly - 3 sets - 10 reps Standing Bicep Curls Supinated with Dumbbells - 1 x daily - 7 x weekly - 3 sets - 10 reps Ulnar Nerve Mobilization - Low Level - 1 x daily - 7 x weekly - 1 sets - 10 reps Seated Shoulder Diagonal Pulls with Resistance - 1 x daily - 7 x weekly - 2 sets - 10 reps  

## 2021-05-15 NOTE — Therapy (Signed)
[facility] [address] [name], [place], [address] Phone: [phone]   Fax:  [phone]  Physical Therapy Treatment  Patient Details  Name: Herbert Davis MRN: [id number] Date of Birth: 09-30-1980 Referring Provider (PT): Stephanie Acre   Encounter Date: 05/15/2021   PT End of Session - 05/15/21 1456     Visit Number 17    Number of Visits 24    Date for PT Re-Evaluation 06/05/21    Authorization Type [employer]    PT Start Time 1450    PT Stop Time 1530    PT Time Calculation (min) 40 min    Activity Tolerance Patient tolerated treatment well    Behavior During Therapy [facility] for tasks assessed/performed             Past Medical History:  Diagnosis Date   Pilonidal cyst    Prediabetes 08/28/2019   Vitamin D deficiency disease 08/28/2019    Past Surgical History:  Procedure Laterality Date   PILONIDAL CYST EXCISION N/A 12/11/2019   Procedure: CYST EXCISION PILONIDAL;  Surgeon: Franky Macho, MD;  Location: [facility];  Service: General;  Laterality: N/A;   WISDOM TOOTH EXTRACTION      There were no vitals filed for this visit.   Subjective Assessment - 05/15/21 1459     Subjective Goes to [facility] next week for scalene dry needling.  N&T is much better, no longer constant.    Patient Stated Goals Tingling to go away    Currently in Pain? Yes    Pain Score 2     Pain Location Shoulder    Pain Orientation Left    Pain Descriptors / Indicators Tightness;Tingling              [facility] Adult PT Treatment/Exercise - 05/15/21 2016       Modalities   Modalities Cryotherapy      Cryotherapy   Number Minutes Cryotherapy 10 Minutes    Cryotherapy Location Shoulder    Type of Cryotherapy Ice massage   L shoulder after dry needling     Manual Therapy   Manual Therapy Soft tissue mobilization;Taping    Manual therapy comments Performed in supine after dry needling    Soft tissue mobilization Use  of myofascial ball on R shoulder to demonstrate how to perform STM on L shoulder (did not perform to L shoulder after dry needling and with tape applied to prevent bruising).  Set ball under R shoulder and hips elevated on yoga block.  Demonstrated how to elevate UE to mobilize myofascial tissue and upper trap.  Pt felt it would be beneficial for L shoulder.    Kinesiotex Inhibit Muscle      Kinesiotix   Inhibit Muscle  Inhibit L upper trap              Trigger Point Dry Needling - 05/15/21 2016     Consent Given? Yes    Education Handout Provided Previously provided    Muscles Treated Head and Neck Upper trapezius    Dry Needling Comments Performed in supine to L side    Upper Trapezius Response Palpable increased muscle length;Twitch reponse elicited               PT Education - 05/15/21 2015     Education Details Provided pt with information and address for [facility] for [place] session.    Person(s) Educated Patient    Methods Explanation    Comprehension  Verbalized understanding              PT Long Term Goals - 04/29/21 1615       PT LONG TERM GOAL #1   Title Pt will be independent with HEP    Time 6    Period Weeks    Status Revised    Target Date 06/05/21      PT LONG TERM GOAL #2   Title Pt will report >75% resolution of L pinky numbness    Baseline Currently 2 or 3/10 NT initially 6/10; 04/24/21 reports 50% resolution of NT (at times will get down to a 1)    Time 6    Period Weeks    Status Revised    Target Date 06/05/21      PT LONG TERM GOAL #3   Title Pt will have increased L grip strength to at least 70 lbs to be more equal to R    Baseline 50-60 lbs on eval; 58 to 75 lbs on 04/24/21    Time 6    Period Weeks    Status On-going    Target Date 06/05/21      PT LONG TERM GOAL #4   Title Pt will be able to lift at least 5 lbs overhead x10 without exacerbating symptoms    Baseline Within 28 sec of shoulder elevation will get  increased symptoms    Time 6    Period Weeks    Status Revised    Target Date 06/05/21                   Plan - 05/15/21 2010     Clinical Impression Statement Pt continues to present with increased tension and pain in L upper trap, addressed with trigger point dry needling, ice massage, and kinesiotape to inhibit L upper trap.  Also demonstrated to pt how to use myofascial ball to perform self myofascial release and massage to upper trap at home.  Pt to have dry needling of L scalene on [facility].  Will assess benefit afterwards.    Personal Factors and Comorbidities Age;Time since onset of injury/illness/exacerbation;Profession;Past/Current Experience    Examination-Activity Limitations Lift;Carry    Examination-Participation Restrictions Occupation    Stability/Clinical Decision Making Evolving/Moderate complexity    Rehab Potential Good    PT Frequency 2x / week   Pt requests to start after next week   PT Duration 6 weeks    PT Treatment/Interventions ADLs/Self Care Home Management;Electrical Stimulation;Moist Heat;Traction;Functional mobility training;Therapeutic activities;Therapeutic exercise;Neuromuscular re-education;Manual techniques;Patient/family education;Passive range of motion;Dry needling;Taping;Spinal Manipulations    PT Next Visit Plan 8/29 - dry needle L scalene at [facility].      1st rib self mob, continue NMR in supine to decrease overactivation of upper trap and progress to more demanding postures.  Stress reduction with breathing.  Postural exercises - progress back to standing for postural exercises with resistance.    PT Home Exercise Plan Access Code: [email or web address]    Consulted and Agree with Plan of Care Patient             Patient will benefit from skilled therapeutic intervention in order to improve the following deficits and impairments:  Increased fascial restricitons, Impaired UE functional use, Pain, Decreased mobility, Decreased  strength, Impaired sensation, Postural dysfunction  Visit Diagnosis: Cervicalgia  Muscle weakness (generalized)  Chronic left shoulder pain  Other disturbances of skin sensation     Problem List Patient Active Problem List  Diagnosis Date Noted   Vitamin D deficiency disease 08/28/2019   Prediabetes 08/28/2019    Dierdre Highman, PT, DPT 05/15/21    8:22 PM    [facility] [address] [place], [place], [address] Phone: [phone]   Fax:  [phone]  Name: CATCHER DEHOYOS MRN: [id number] Date of Birth: 27-Nov-1980

## 2021-05-19 ENCOUNTER — Other Ambulatory Visit: Payer: Self-pay

## 2021-05-19 ENCOUNTER — Ambulatory Visit: Payer: 59 | Admitting: Physical Therapy

## 2021-05-19 ENCOUNTER — Encounter: Payer: Self-pay | Admitting: Physical Therapy

## 2021-05-19 DIAGNOSIS — M6281 Muscle weakness (generalized): Secondary | ICD-10-CM

## 2021-05-19 DIAGNOSIS — G8929 Other chronic pain: Secondary | ICD-10-CM

## 2021-05-19 DIAGNOSIS — M542 Cervicalgia: Secondary | ICD-10-CM

## 2021-05-19 DIAGNOSIS — R208 Other disturbances of skin sensation: Secondary | ICD-10-CM

## 2021-05-19 NOTE — Therapy (Addendum)
[facility] [address] [place], [place], [address] Phone: [phone]   Fax:  [phone]  Physical Therapy Treatment  Patient Details  Name: Herbert Davis MRN: [id number] Date of Birth: 17-Oct-1980 Referring Provider (PT): Stephanie Acre   Encounter Date: 05/19/2021   PT End of Session - 05/19/21 1459     Visit Number 18    Number of Visits 24    Date for PT Re-Evaluation 06/05/21    PT Start Time 1500    PT Stop Time 1548    PT Time Calculation (min) 48 min    Activity Tolerance Patient tolerated treatment well    Behavior During Therapy [facility] for tasks assessed/performed             Past Medical History:  Diagnosis Date   Pilonidal cyst    Prediabetes 08/28/2019   Vitamin D deficiency disease 08/28/2019    Past Surgical History:  Procedure Laterality Date   PILONIDAL CYST EXCISION N/A 12/11/2019   Procedure: CYST EXCISION PILONIDAL;  Surgeon: Franky Macho, MD;  Location: [facility];  Service: General;  Laterality: N/A;   WISDOM TOOTH EXTRACTION      There were no vitals filed for this visit.   Subjective Assessment - 05/19/21 1500     Subjective "The shoulder is loosening I still have that N/T in to the pinky, pain is about 2/10."    Patient Stated Goals Tingling to go away    Currently in Pain? Yes    Pain Score 2     Pain Location Shoulder    Pain Orientation Left    Pain Descriptors / Indicators Aching    Pain Type Chronic pain    Pain Onset More than a month ago    Pain Frequency Intermittent                               [facility] Adult PT Treatment/Exercise - 05/19/21 0001       Neck Exercises: Stretches   Other Neck Stretches scalene stretch 2 x 30    Other Neck Stretches upper trap PNF contract relax stretch on3 x 30 sec holding with 10 sec contraction      Shoulder Exercises: ROM/Strengthening   UBE (Upper Arm Bike) L3 x 4 min   FWD/BWD x 2 min ea.     Modalities    Modalities Traction;Moist Heat      Moist Heat Therapy   Number Minutes Moist Heat 10 Minutes    Moist Heat Location Shoulder   L shoulder performed during mechanical traction     Traction   Type of Traction Cervical    Min (lbs) 12    Max (lbs) 20    Hold Time 60    Rest Time 20    Time 10      Manual Therapy   Manual Therapy Manual Traction    Manual therapy comments skilled palpation and monitoring of pt during TPDN    Joint Mobilization L inferior rib mobs grade III    Soft tissue mobilization IASTM along the L upper trap/ leator scapuale and scalene    Manual Traction cervical x 3 min   noted relief of LUE referred symptoms   Other Manual Therapy L upper trap inhibition taping (using 3 "I" strips) with cover roll and leuko tape              Trigger Point Dry Needling -  05/19/21 0001     Consent Given? Yes (P)     Education Handout Provided Previously provided (P)     Muscles Treated Head and Neck Upper trapezius;Scalenes (P)     Upper Trapezius Response Twitch reponse elicited;Palpable increased muscle length (P)     Scalenes Response Twitch reponse elicited;Palpable increased muscle length (P)                        PT Long Term Goals - 04/29/21 1615       PT LONG TERM GOAL #1   Title Pt will be independent with HEP    Time 6    Period Weeks    Status Revised    Target Date 06/05/21      PT LONG TERM GOAL #2   Title Pt will report >75% resolution of L pinky numbness    Baseline Currently 2 or 3/10 NT initially 6/10; 04/24/21 reports 50% resolution of NT (at times will get down to a 1)    Time 6    Period Weeks    Status Revised    Target Date 06/05/21      PT LONG TERM GOAL #3   Title Pt will have increased L grip strength to at least 70 lbs to be more equal to R    Baseline 50-60 lbs on eval; 58 to 75 lbs on 04/24/21    Time 6    Period Weeks    Status On-going    Target Date 06/05/21      PT LONG TERM GOAL #4   Title Pt will be able to  lift at least 5 lbs overhead x10 without exacerbating symptoms    Baseline Within 28 sec of shoulder elevation will get increased symptoms    Time 6    Period Weeks    Status Revised    Target Date 06/05/21                   Plan - 05/19/21 1543     Clinical Impression Statement pt reports continued LUE referred symptom into the L ulnar distributions. educated and consent was provided for TPDN focusing on the L scalene and upper trap followed with IASTM techniques. trialed inhibition taping over the R upper trap.  He responded well to contract/ relax stretching and noted relief of symptoms with manual traction. Since he noted relief with manual traction opted to trial mechanical traction which he continued to report 0/10 pain with no N/T.    PT Treatment/Interventions ADLs/Self Care Home Management;Electrical Stimulation;Moist Heat;Traction;Functional mobility training;Therapeutic activities;Therapeutic exercise;Neuromuscular re-education;Manual techniques;Patient/family education;Passive range of motion;Dry needling;Taping;Spinal Manipulations    PT Next Visit Plan Response to DN for scalenes, response to mechanical traction   1st rib self mob, continue NMR in supine to decrease overactivation of upper trap and progress to more demanding postures.  Stress reduction with breathing.  Postural exercises - progress back to standing for postural exercises with resistance.    PT Home Exercise Plan Access Code: [email or web address]    Consulted and Agree with Plan of Care Patient             Patient will benefit from skilled therapeutic intervention in order to improve the following deficits and impairments:  Increased fascial restricitons, Impaired UE functional use, Pain, Decreased mobility, Decreased strength, Impaired sensation, Postural dysfunction  Visit Diagnosis: Cervicalgia  Muscle weakness (generalized)  Chronic left shoulder pain  Other disturbances of skin  sensation  Problem List Patient Active Problem List   Diagnosis Date Noted   Vitamin D deficiency disease 08/28/2019   Prediabetes 08/28/2019   Lulu Riding PT, DPT, LAT, ATC  05/19/21  5:42 PM      [facility] [address] [place], [place], [address] Phone: [phone]   Fax:  [phone]  Name: Herbert Davis MRN: [id number] Date of Birth: Apr 19, 1981

## 2021-05-20 ENCOUNTER — Encounter: Payer: 59 | Admitting: Physical Therapy

## 2021-05-21 ENCOUNTER — Encounter: Payer: Self-pay | Admitting: Neurology

## 2021-05-21 ENCOUNTER — Ambulatory Visit: Payer: 59 | Admitting: Neurology

## 2021-05-21 DIAGNOSIS — R202 Paresthesia of skin: Secondary | ICD-10-CM | POA: Diagnosis not present

## 2021-05-21 NOTE — Progress Notes (Signed)
Reason for visit: Left hand paresthesia, shoulder pain  Herbert Davis is an 40 y.o. male  History of present illness:  Herbert Davis is a 39 year old left-handed white male with a history of chronic tingling of the fourth and fifth fingers of the left hand and some left shoulder pain.  MRI of the cervical spine was normal, EMG and nerve conduction study was unremarkable.  The patient has been engaged in neuromuscular therapy which seems to be helpful.  The level of pain in the shoulder has improved and the tingling in the left fourth and fifth finger is intermittent at this point, not constant.  He overall is doing fairly well, he is continuing stretching exercises and strengthening exercises for his arm and shoulder.  Past Medical History:  Diagnosis Date   Pilonidal cyst    Prediabetes 08/28/2019   Vitamin D deficiency disease 08/28/2019    Past Surgical History:  Procedure Laterality Date   PILONIDAL CYST EXCISION N/A 12/11/2019   Procedure: CYST EXCISION PILONIDAL;  Surgeon: Franky Macho, MD;  Location: [facility];  Service: General;  Laterality: N/A;   WISDOM TOOTH EXTRACTION      Family History  Problem Relation Age of Onset   Healthy Mother    Healthy Father    Healthy Sister    Healthy Brother    Healthy Brother     Social history:  reports that he has never smoked. He has never used smokeless tobacco. He reports that he does not drink alcohol and does not use drugs.   No Known Allergies  Medications:  Prior to Admission medications   Medication Sig Start Date End Date Taking? Authorizing Provider  Cholecalciferol (VITAMIN D-3) 125 MCG (5000 UT) TABS Take 1 tablet by mouth every other day 10/31/20  Yes Elenore Paddy, NP    ROS:  Out of a complete 14 system review of symptoms, the patient complains only of the following symptoms, and all other reviewed systems are negative.  Left hand tingling  Blood pressure 113/78, pulse 82, height 5\' 11"  (1.803 m), weight 215 lb  (97.5 kg).  Physical Exam  General: The patient is alert and cooperative at the time of the examination.  Neuromuscular: With lateral elevation of the arms or with anterior elevation of the arms, there appears to be a depression of the left shoulder relative to the right, possibly some mild weakness with elevation of the arm on the left relative to the right.  Skin: No significant peripheral edema is noted.   Neurologic Exam  Mental status: The patient is alert and oriented x 3 at the time of the examination. The patient has apparent normal recent and remote memory, with an apparently normal attention span and concentration ability.   Cranial nerves: Facial symmetry is present. Speech is normal, no aphasia or dysarthria is noted. Extraocular movements are full. Visual fields are full.  Motor: The patient has good strength in all 4 extremities, with exception of subtle findings noted on the left arm as above.  Sensory examination: Soft touch sensation is symmetric on the face, arms, and legs.  Coordination: The patient has good finger-nose-finger and heel-to-shin bilaterally.  Gait and station: The patient has a normal gait. Tandem gait is normal. Romberg is negative. No drift is seen.  Reflexes: Deep tendon reflexes are symmetric.   MRI cervical 01/29/21:   IMPRESSION:    Normal MRI cervical spine (without).    EMG and NCV study 03/10/21:  IMPRESSION:   Nerve  conduction studies done on both upper extremities were unremarkable.  No evidence of neuropathy was seen.  EMG evaluation of the left upper extremity is normal, there is no evidence of an overlying cervical radiculopathy.     Assessment/Plan:  1.  Left shoulder discomfort, left hand tingling  The patient likely has some paresthesias in the left hand associated with chronic tension or spasm of the muscles in the left shoulder.  It is possible that the patient may have some mild weakness of the upper trapezius of the  left shoulder.  The patient is engaging in physical therapy, this seems to be improving symptoms.  We discussed potentially adding nortriptyline or amitriptyline in the evening, he does not wish to do this.  I will have him follow-up here on an as-needed basis, he will contact our office if he has any concerns.  Marlan Palau MD 05/21/2021 7:19 AM  [facility] [address] [place], [place] [address]  Phone [phone] Fax [phone]

## 2021-05-22 ENCOUNTER — Ambulatory Visit: Payer: 59 | Attending: Neurology

## 2021-05-22 ENCOUNTER — Other Ambulatory Visit: Payer: Self-pay

## 2021-05-22 DIAGNOSIS — M542 Cervicalgia: Secondary | ICD-10-CM | POA: Insufficient documentation

## 2021-05-22 DIAGNOSIS — M6281 Muscle weakness (generalized): Secondary | ICD-10-CM | POA: Diagnosis present

## 2021-05-22 DIAGNOSIS — M25512 Pain in left shoulder: Secondary | ICD-10-CM | POA: Diagnosis present

## 2021-05-22 DIAGNOSIS — G8929 Other chronic pain: Secondary | ICD-10-CM | POA: Diagnosis present

## 2021-05-22 DIAGNOSIS — R208 Other disturbances of skin sensation: Secondary | ICD-10-CM | POA: Diagnosis present
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
## 2021-05-27 ENCOUNTER — Ambulatory Visit: Payer: 59 | Admitting: Physical Therapy

## 2021-05-27 ENCOUNTER — Other Ambulatory Visit: Payer: Self-pay

## 2021-05-27 DIAGNOSIS — M542 Cervicalgia: Secondary | ICD-10-CM

## 2021-05-27 DIAGNOSIS — R208 Other disturbances of skin sensation: Secondary | ICD-10-CM

## 2021-05-27 DIAGNOSIS — M25512 Pain in left shoulder: Secondary | ICD-10-CM

## 2021-05-27 DIAGNOSIS — M6281 Muscle weakness (generalized): Secondary | ICD-10-CM

## 2021-05-27 DIAGNOSIS — G8929 Other chronic pain: Secondary | ICD-10-CM

## 2021-05-27 NOTE — Therapy (Signed)
[facility] [address] [place], [place], [address] Phone: [phone]   Fax:  [phone]  Physical Therapy Treatment  Patient Details  Name: Herbert Davis MRN: [id number] Date of Birth: 01-Mar-1981 Referring Provider (PT): Stephanie Acre   Encounter Date: 05/27/2021   PT End of Session - 05/27/21 1236     Visit Number 20    Number of Visits 24    Date for PT Re-Evaluation 06/05/21    Authorization Type [employer]    PT Start Time 1235    PT Stop Time 1317    PT Time Calculation (min) 42 min    Activity Tolerance Patient tolerated treatment well    Behavior During Therapy [facility] for tasks assessed/performed             Past Medical History:  Diagnosis Date   Pilonidal cyst    Prediabetes 08/28/2019   Vitamin D deficiency disease 08/28/2019    Past Surgical History:  Procedure Laterality Date   PILONIDAL CYST EXCISION N/A 12/11/2019   Procedure: CYST EXCISION PILONIDAL;  Surgeon: Franky Macho, MD;  Location: [facility];  Service: General;  Laterality: N/A;   WISDOM TOOTH EXTRACTION      There were no vitals filed for this visit.   Subjective Assessment - 05/27/21 1236     Subjective Scalene DN went well.  L shoulder is very tense today, woke up at a 3/10, increased to a 5/10 with driving to therapy.  L shoulder will not relax down.    Patient Stated Goals Tingling to go away    Currently in Pain? Yes    Pain Score 5     Pain Onset More than a month ago              [facility] Adult PT Treatment/Exercise - 05/28/21 1010       Modalities   Modalities Moist Heat      Moist Heat Therapy   Number Minutes Moist Heat 20 Minutes    Moist Heat Location Cervical;Shoulder   L shoulder in supine while performing manual therapy     Manual Therapy   Manual Therapy Soft tissue mobilization;Passive ROM;Manual Traction;Myofascial release    Manual therapy comments performed in supine with heat applied to L  shoulder and neck to decrease mm guarding    Soft tissue mobilization use of spiky massage/myofascial release ball applied to L pectoralis muscles passively in supine and then actively to L upper trap with pt performing repetitive UE flexion    Myofascial Release Bilat suboccipital release x 8 minutes alternating with manual traction    Passive ROM performed to L upper trap in combination with contract-relax of L upper trap to increase ROM and decrease guarding.  Also performed to L pectoralis muscles including contract-relax and manual scapular retraction and depression    Manual Traction upper cervical manual traction alternating with suboccipital release               PT Education - 05/28/21 1009     Education Details Spiky massage ball for self-massage/myofascial release of upper trap and pec    Person(s) Educated Patient    Methods Explanation;Demonstration    Comprehension Verbalized understanding                 PT Long Term Goals - 04/29/21 1615       PT LONG TERM GOAL #1   Title Pt will be independent with HEP    Time 6  Period Weeks    Status Revised    Target Date 06/05/21      PT LONG TERM GOAL #2   Title Pt will report >75% resolution of L pinky numbness    Baseline Currently 2 or 3/10 NT initially 6/10; 04/24/21 reports 50% resolution of NT (at times will get down to a 1)    Time 6    Period Weeks    Status Revised    Target Date 06/05/21      PT LONG TERM GOAL #3   Title Pt will have increased L grip strength to at least 70 lbs to be more equal to R    Baseline 50-60 lbs on eval; 58 to 75 lbs on 04/24/21    Time 6    Period Weeks    Status On-going    Target Date 06/05/21      PT LONG TERM GOAL #4   Title Pt will be able to lift at least 5 lbs overhead x10 without exacerbating symptoms    Baseline Within 28 sec of shoulder elevation will get increased symptoms    Time 6    Period Weeks    Status Revised    Target Date 06/05/21                    Plan - 05/28/21 1002     Clinical Impression Statement Pt experiencing a flare of symptoms today after increased stress at work and driving this morning.  Treatment session focused on use of manual therapy to reduce symptoms and decrease muscle guarding.  Also continued to introduce and discuss ways for pt to self manage symptom flares.  Pt reporting pain decreased to 4/10 and demonstrated more neutral L shoulder position at end of session.    PT Treatment/Interventions ADLs/Self Care Home Management;Electrical Stimulation;Moist Heat;Traction;Functional mobility training;Therapeutic activities;Therapeutic exercise;Neuromuscular re-education;Manual techniques;Patient/family education;Passive range of motion;Dry needling;Taping;Spinal Manipulations    PT Next Visit Plan Pt had a flare in symptoms.  Continue to review self management techniques pt can use when pain flares, postural exercises, stretches.  Stress reduction with breathing.  Postural exercises - progress back to standing for postural exercises with resistance.  Begin checking goals next week.    PT Home Exercise Plan Access Code: [email or web address]    Consulted and Agree with Plan of Care Patient             Patient will benefit from skilled therapeutic intervention in order to improve the following deficits and impairments:  Increased fascial restricitons, Impaired UE functional use, Pain, Decreased mobility, Decreased strength, Impaired sensation, Postural dysfunction  Visit Diagnosis: Cervicalgia  Muscle weakness (generalized)  Chronic left shoulder pain  Other disturbances of skin sensation     Problem List Patient Active Problem List   Diagnosis Date Noted   Paresthesia 05/21/2021   Vitamin D deficiency disease 08/28/2019   Prediabetes 08/28/2019    Dierdre Highman, PT, DPT 05/28/21    10:18 AM    [facility] [address] [place], [place],  [address] Phone: [phone]   Fax:  [phone]  Name: Herbert Davis MRN: [id number] Date of Birth: July 22, 1981

## 2021-05-29 ENCOUNTER — Other Ambulatory Visit: Payer: Self-pay

## 2021-05-29 ENCOUNTER — Ambulatory Visit: Payer: 59

## 2021-05-29 DIAGNOSIS — M542 Cervicalgia: Secondary | ICD-10-CM | POA: Diagnosis not present

## 2021-05-29 DIAGNOSIS — M6281 Muscle weakness (generalized): Secondary | ICD-10-CM

## 2021-05-29 DIAGNOSIS — G8929 Other chronic pain: Secondary | ICD-10-CM

## 2021-05-29 NOTE — Therapy (Signed)
[facility] [address] [place], [place], [address] Phone: [phone]   Fax:  [phone]  Physical Therapy Treatment  Patient Details  Name: Herbert Davis MRN: [id number] Date of Birth: 1981/07/09 Referring Provider (PT): Margette Fast   Encounter Date: 05/29/2021   PT End of Session - 05/29/21 1254     Visit Number 21    Number of Visits 24    Date for PT Re-Evaluation 06/05/21    Authorization Type [employer]    Progress Note Due on Visit 24    PT Start Time 5320    PT Stop Time 1100    PT Time Calculation (min) 45 min    Activity Tolerance Patient tolerated treatment well    Behavior During Therapy [facility] for tasks assessed/performed             Past Medical History:  Diagnosis Date   Pilonidal cyst    Prediabetes 08/28/2019   Vitamin D deficiency disease 08/28/2019    Past Surgical History:  Procedure Laterality Date   PILONIDAL CYST EXCISION N/A 12/11/2019   Procedure: CYST EXCISION PILONIDAL;  Surgeon: Aviva Signs, MD;  Location: [facility];  Service: General;  Laterality: N/A;   WISDOM TOOTH EXTRACTION      There were no vitals filed for this visit.   Subjective Assessment - 05/29/21 1018     Subjective Reports 2/10 pain to start today.  Feels symptoms are better overall but not resolved.  DN and traction have helped the most.    Patient Stated Goals Tingling to go away    Pain Onset More than a month ago                               [facility] Adult PT Treatment/Exercise - 05/29/21 0001       Exercises   Other Exercises  R grip strength 112#, L 72.3#      Neck Exercises: Stretches   Other Neck Stretches suboccipital stertch over towel roll x5"      Shoulder Exercises: Supine   Flexion AROM;Strengthening;Left;10 reps;Limitations    Flexion Limitations 3x10 with tennis ball under upper trap      Shoulder Exercises: Seated   Elevation AROM;Left;10 reps;Limitations     Elevation Limitations 3x10 with tennis ball pressure on L 1st rib      Modalities   Modalities Traction      Traction   Type of Traction Cervical      Manual Therapy   Joint Mobilization MWMs of upper t-spine and lower c-spine into extension, 5 mobs at ea. level improving cervical extension from 25% to [facility]    Myofascial Release Selp suboccipital traction over towel x5'                     PT Education - 05/29/21 1252     Education Details Added to HEP, instructed in 1st rib mob with UE flexion using tennis ball    Person(s) Educated Patient    Methods Explanation;Demonstration    Comprehension Verbalized understanding;Returned demonstration                 PT Long Term Goals - 05/29/21 1311       PT LONG TERM GOAL #1   Title Pt will be independent with HEP    Baseline 05/29/21 Updated HEP and able to demo new additions back to PT  Time 6    Period Weeks    Status Achieved      PT LONG TERM GOAL #2   Title Pt will report >75% resolution of L pinky numbness    Baseline Currently 2 or 3/10 NT initially 6/10; 04/24/21 reports 50% resolution of NT (at times will get down to a 1); 05/29/21 Symptoms less frequent and down to 1/10 at times    Time 6    Period Weeks    Status Partially Met      PT LONG TERM GOAL #3   Title Pt will have increased L grip strength to at least 70 lbs to be more equal to R    Baseline 50-60 lbs on eval; 58 to 75 lbs on 04/24/21; 05/29/21 L grip strength 72.3#, R 122.4#    Time 6    Period Weeks    Status Partially Met      PT LONG TERM GOAL #4   Title Pt will be able to lift at least 5 lbs overhead x10 without exacerbating symptoms    Baseline Within 28 sec of shoulder elevation will get increased symptoms; 05/29/21 Able to complete 10 reps with symptoms changing from 1/10 to 2/10    Time 6    Period Weeks    Status Partially Met                   Plan - 05/29/21 1258     Clinical Impression Statement Todays session  continued to address soft tissue dysfunction in the environment of TOS, instructed in self traction focused on suboccipital region for 5-8 min hold, FF of L shoulder stabilizing first rib with tennis ball, 3x10, addressing progress towards goals, no pec minor tightness noted B, reinstated scaplular retraction at 0d abd, PA mobs to lower cervical and upper thoracic spine, MWMs into flexion both regions.  Cervical extenison improved from 25% to 90% following intervention, initialy reporting dizziness which resolved by end of mobs.  L grip strength improved to 72.3# and able to lift 5# OH 10x with symptoms increasing from 1/10 to 2/10.  L scapular stabilizers still show weakness as evidenced by winging during retractions    Stability/Clinical Decision Making Evolving/Moderate complexity    Rehab Potential Good    PT Frequency 2x / week    PT Duration 6 weeks    PT Treatment/Interventions ADLs/Self Care Home Management;Electrical Stimulation;Moist Heat;Traction;Functional mobility training;Therapeutic activities;Therapeutic exercise;Neuromuscular re-education;Manual techniques;Patient/family education;Passive range of motion;Dry needling;Taping;Spinal Manipulations    PT Next Visit Plan Pt had a flare in symptoms.  Continue to review self management techniques pt can use when pain flares, postural exercises, stretches.  Stress reduction with breathing.  Postural exercises - progress back to standing for postural exercises with resistance.  Begin checking goals next week.    PT Home Exercise Plan Access Code: [email or web address]    Consulted and Agree with Plan of Care Patient             Patient will benefit from skilled therapeutic intervention in order to improve the following deficits and impairments:  Increased fascial restricitons, Impaired UE functional use, Pain, Decreased mobility, Decreased strength, Impaired sensation, Postural dysfunction  Visit Diagnosis: Cervicalgia  Muscle weakness  (generalized)  Chronic left shoulder pain     Problem List Patient Active Problem List   Diagnosis Date Noted   Paresthesia 05/21/2021   Vitamin D deficiency disease 08/28/2019   Prediabetes 08/28/2019    Lanice Shirts, PT 05/29/2021, 1:52 PM  [facility] [address] [place], [place], [address] Phone: [phone]   Fax:  [phone]  Name: Herbert Davis MRN: [id number] Date of Birth: 24-May-1981

## 2021-06-02 ENCOUNTER — Other Ambulatory Visit: Payer: Self-pay

## 2021-06-02 ENCOUNTER — Ambulatory Visit: Payer: 59 | Admitting: Physical Therapy

## 2021-06-02 DIAGNOSIS — G8929 Other chronic pain: Secondary | ICD-10-CM

## 2021-06-02 DIAGNOSIS — R208 Other disturbances of skin sensation: Secondary | ICD-10-CM

## 2021-06-02 DIAGNOSIS — M25512 Pain in left shoulder: Secondary | ICD-10-CM

## 2021-06-02 DIAGNOSIS — M542 Cervicalgia: Secondary | ICD-10-CM

## 2021-06-02 DIAGNOSIS — M6281 Muscle weakness (generalized): Secondary | ICD-10-CM

## 2021-06-02 NOTE — Therapy (Signed)
[facility] [address] [place], [place], [address] Phone: [phone]   Fax:  [phone]  Physical Therapy Treatment  Patient Details  Name: Herbert Davis MRN: [id number] Date of Birth: [birth year] Referring Provider (PT): Stephanie Acre   Encounter Date: 06/02/2021   PT End of Session - 06/02/21 1235     Visit Number 22    Number of Visits 24    Date for PT Re-Evaluation 06/05/21    Authorization Type [employer]    Progress Note Due on Visit 24    PT Start Time 1235    PT Stop Time 1315    PT Time Calculation (min) 40 min    Equipment Utilized During Treatment Other (comment)   dry needles   Activity Tolerance Patient tolerated treatment well    Behavior During Therapy [facility] for tasks assessed/performed             Past Medical History:  Diagnosis Date   Pilonidal cyst    Prediabetes 08/28/2019   Vitamin D deficiency disease 08/28/2019    Past Surgical History:  Procedure Laterality Date   PILONIDAL CYST EXCISION N/A 12/11/2019   Procedure: CYST EXCISION PILONIDAL;  Surgeon: Franky Macho, MD;  Location: [facility];  Service: General;  Laterality: N/A;   WISDOM TOOTH EXTRACTION      There were no vitals filed for this visit.   Subjective Assessment - 06/02/21 1238     Subjective Reports having a good weekend, Numbness and tingling down to 1/10.  Was able to purchase and use spiky massage balls at home.  Would like to finish up with therapy this week and maintain with HEP.  Pt is getting busier at work.    Patient Stated Goals Tingling to go away    Pain Score 1     Pain Location Arm    Pain Orientation Left    Pain Onset More than a month ago              [facility] Adult PT Treatment/Exercise - 06/02/21 2029       Therapeutic Activites    Therapeutic Activities Other Therapeutic Activities    Other Therapeutic Activities Discussed plan for therapy, patient's progress, areas of continued  impairment and patient's preference for more visits vs. discharge next visit.  Pt's preference is to D/C next visit and maintain with HEP; pt reports he is getting very busy at work and the drive to and from therapy can be very provoking.      Shoulder Exercises: Supine   Diagonals Strengthening;Left;5 reps;Limitations    Diagonals Limitations LUE PNF D2 flexion <> extension against therapist's manual resistance alternating between flexion and extension first with isometric hold each direction x 5 seconds and then while moving through ROM.  Began with only scapular movement > UE isometric in 90 deg flexion and then UE moving through ROM      Modalities   Modalities Moist Heat      Moist Heat Therapy   Number Minutes Moist Heat 10 Minutes    Moist Heat Location Cervical;Shoulder      Manual Therapy   Manual Therapy Soft tissue mobilization    Soft tissue mobilization STM to L upper trap and cervical paraspinals after performing dry needling              Trigger Point Dry Needling - 06/02/21 2029     Consent Given? Yes    Education Handout Provided Previously provided  Muscles Treated Head and Neck Upper trapezius;Splenius capitus;Semispinalis capitus;Cervical multifidi    Dry Needling Comments Performed in prone to L side only    Upper Trapezius Response Twitch reponse elicited;Palpable increased muscle length    Splenius capitus Response Twitch reponse elicited;Palpable increased muscle length    Semispinalis capitus Response Palpable increased muscle length;Twitch reponse elicited    Cervical multifidi Response Twitch reponse elicited;Palpable increased muscle length                   PT Education - 06/02/21 2038     Education Details see TA    Person(s) Educated Patient    Methods Explanation    Comprehension Verbalized understanding                 PT Long Term Goals - 05/29/21 1311       PT LONG TERM GOAL #1   Title Pt will be independent with  HEP    Baseline 05/29/21 Updated HEP and able to demo new additions back to PT    Time 6    Period Weeks    Status Achieved      PT LONG TERM GOAL #2   Title Pt will report >75% resolution of L pinky numbness    Baseline Currently 2 or 3/10 NT initially 6/10; 04/24/21 reports 50% resolution of NT (at times will get down to a 1); 05/29/21 Symptoms less frequent and down to 1/10 at times    Time 6    Period Weeks    Status Partially Met      PT LONG TERM GOAL #3   Title Pt will have increased L grip strength to at least 70 lbs to be more equal to R    Baseline 50-60 lbs on eval; 58 to 75 lbs on 04/24/21; 05/29/21 L grip strength 72.3#, R 122.4#    Time 6    Period Weeks    Status Partially Met      PT LONG TERM GOAL #4   Title Pt will be able to lift at least 5 lbs overhead x10 without exacerbating symptoms    Baseline Within 28 sec of shoulder elevation will get increased symptoms; 05/29/21 Able to complete 10 reps with symptoms changing from 1/10 to 2/10    Time 6    Period Weeks    Status Partially Met                   Plan - 06/02/21 1317     Clinical Impression Statement Pt feels he has made good progress and would like to D/C from therapy at next visit.  Pt understands the need for continued attention to stress management, neck and shoulder ROM and strengthening to maintain gains made in therapy.  Completed final dry needling session; pt demonstrates significant improvement in upper trapezius tissue length; increased focus on cervical paraspinal muscles today due to ongoing tension and trigger points noted.  Will finalize HEP next session.    Stability/Clinical Decision Making Evolving/Moderate complexity    Rehab Potential Good    PT Frequency 2x / week    PT Duration 6 weeks    PT Treatment/Interventions ADLs/Self Care Home Management;Electrical Stimulation;Moist Heat;Traction;Functional mobility training;Therapeutic activities;Therapeutic exercise;Neuromuscular  re-education;Manual techniques;Patient/family education;Passive range of motion;Dry needling;Taping;Spinal Manipulations    PT Next Visit Plan Pt would like to finish up this visit.  Please review self mobilization and HEP that he can do to maintain gains made in therapy.  D/C.  PT Home Exercise Plan Access Code: [email or web address]    Consulted and Agree with Plan of Care Patient             Patient will benefit from skilled therapeutic intervention in order to improve the following deficits and impairments:  Increased fascial restricitons, Impaired UE functional use, Pain, Decreased mobility, Decreased strength, Impaired sensation, Postural dysfunction  Visit Diagnosis: Cervicalgia  Muscle weakness (generalized)  Chronic left shoulder pain  Other disturbances of skin sensation     Problem List Patient Active Problem List   Diagnosis Date Noted   Paresthesia 05/21/2021   Vitamin D deficiency disease 08/28/2019   Prediabetes 08/28/2019    Rico Junker, PT, DPT 06/02/21    8:40 PM    [facility] [address] [place], [place], [address] Phone: [phone]   Fax:  [phone]  Name: RHYSE LOUX MRN: [id number] Date of Birth: [birth year]

## 2021-06-04 ENCOUNTER — Other Ambulatory Visit: Payer: Self-pay

## 2021-06-04 ENCOUNTER — Ambulatory Visit: Payer: 59

## 2021-06-04 DIAGNOSIS — M6281 Muscle weakness (generalized): Secondary | ICD-10-CM

## 2021-06-04 DIAGNOSIS — M542 Cervicalgia: Secondary | ICD-10-CM

## 2021-06-04 DIAGNOSIS — M25512 Pain in left shoulder: Secondary | ICD-10-CM

## 2021-06-04 DIAGNOSIS — G8929 Other chronic pain: Secondary | ICD-10-CM

## 2021-06-04 DIAGNOSIS — R208 Other disturbances of skin sensation: Secondary | ICD-10-CM

## 2021-06-04 NOTE — Patient Instructions (Signed)
Access Code: [email or web address] URL: [email or web address] Date: 06/04/2021 Prepared by: Gustavus Bryant  Exercises Upper Trap Stretch with Belt - 1 x daily - 7 x weekly - 1 sets - 10 reps Standing Pec Stretch at Wall - 1 x daily - 7 x weekly - 2 sets - 30 second hold hold Prone Middle Trapezius Strengthening on Swiss Ball - 1 x daily - 7 x weekly - 2 sets - 10 reps Shoulder External Rotation and Scapular Retraction with Resistance - 1 x daily - 7 x weekly - 3 sets - 10 reps Standing Bicep Curls Supinated with Dumbbells - 1 x daily - 7 x weekly - 3 sets - 10 reps Ulnar Nerve Mobilization - Low Level - 1 x daily - 7 x weekly - 1 sets - 10 reps Seated Shoulder Diagonal Pulls with Resistance - 1 x daily - 7 x weekly - 2 sets - 10 reps Supine Chin Tuck - 2 x daily - 7 x weekly - 2 sets - 10 reps Shoulder External Rotation and Scapular Retraction with Resistance - 2 x daily - 7 x weekly - 3 sets - 10 reps Standing Single Arm Shoulder Flexion Stretch on Wall - 2 x daily - 7 x weekly - 2 sets - 10 reps

## 2021-06-04 NOTE — Therapy (Signed)
[facility] [address] [place], [place], [address] Phone: [phone]   Fax:  [phone]  Physical Therapy Treatment/DC Summary  Patient Details  Name: BOEN STERBENZ MRN: [id number] Date of Birth: [birth year] Referring Provider (PT): Margette Fast   Encounter Date: 06/04/2021   Visits from Start of Care: 23  Current functional level related to goals / functional outcomes: Goals met, patient ready for independent management   Remaining deficits: Occasional 2/10 discomfort at worst   Education / Equipment: HEP   Patient agrees to discharge. Patient goals were met. Patient is being discharged due to being pleased with the current functional level.    PT End of Session - 06/04/21 1320     Visit Number 23    Number of Visits 24    Date for PT Re-Evaluation 06/05/21    Authorization Type [employer]    Progress Note Due on Visit 24    PT Start Time 1235    PT Stop Time 1315    PT Time Calculation (min) 40 min    Equipment Utilized During Treatment Other (comment)   dry needles   Activity Tolerance Patient tolerated treatment well    Behavior During Therapy [facility] for tasks assessed/performed             Past Medical History:  Diagnosis Date   Pilonidal cyst    Prediabetes 08/28/2019   Vitamin D deficiency disease 08/28/2019    Past Surgical History:  Procedure Laterality Date   PILONIDAL CYST EXCISION N/A 12/11/2019   Procedure: CYST EXCISION PILONIDAL;  Surgeon: Aviva Signs, MD;  Location: [facility];  Service: General;  Laterality: N/A;   WISDOM TOOTH EXTRACTION      There were no vitals filed for this visit.   Subjective Assessment - 06/04/21 1315     Subjective Requests DC today, feels he is ready for independent management as symptoms have stabilized    Patient Stated Goals Tingling to go away    Currently in Pain? Yes    Pain Score 2     Pain Orientation Left    Pain Descriptors /  Indicators Tingling    Pain Type Chronic pain    Pain Onset More than a month ago                               [facility] Adult PT Treatment/Exercise - 06/04/21 0001       Shoulder Exercises: Supine   Flexion AROM;Strengthening;Left;10 reps;Limitations    Flexion Limitations 3x10 with tennis ball under upper trap      Shoulder Exercises: Seated   Elevation Limitations 3x10 with tennis ball pressure on L 1st rib      Shoulder Exercises: Standing   Flexion Strengthening;Left;10 reps;Limitations    Flexion Limitations Ball roling for flexion and WS to improve scapular stabilization      Manual Therapy   Manual Therapy Joint mobilization;Soft tissue mobilization;Manual Traction;Muscle Energy Technique    Manual therapy comments suboccipital stertch x10'    Soft tissue mobilization STM L anterior scalene    Other Manual Therapy inferior mobilization of L rib, 10x    Muscle Energy Technique L first rib mob in sitting                     PT Education - 06/04/21 1319     Education Details Answered DC questions concerns, verbally reviewed HEP  Person(s) Educated Patient    Methods Explanation    Comprehension Verbalized understanding;Returned demonstration                 PT Long Term Goals - 06/04/21 1238       PT LONG TERM GOAL #1   Title Pt will be independent with HEP    Baseline 05/29/21 Updated HEP and able to demo new additions back to PT    Time 6    Period Weeks    Status Achieved      PT LONG TERM GOAL #2   Title Pt will report >75% resolution of L pinky numbness    Baseline Currently 2 or 3/10 NT initially 6/10; 04/24/21 reports 50% resolution of NT (at times will get down to a 1); 05/29/21 Symptoms less frequent and down to 1/10 at times    Time 6    Period Weeks    Status Achieved      PT LONG TERM GOAL #3   Title Pt will have increased L grip strength to at least 70 lbs to be more equal to R    Baseline 50-60 lbs on eval; 58  to 75 lbs on 04/24/21; 05/29/21 L grip strength 72.3#, R 122.4#; 06/04/21 R grip 84#, L 82# at position 2    Time 6    Period Weeks    Status Achieved      PT LONG TERM GOAL #4   Title Pt will be able to lift at least 5 lbs overhead x10 without exacerbating symptoms    Baseline Within 28 sec of shoulder elevation will get increased symptoms; 05/29/21 Able to complete 10 reps with symptoms changing from 1/10 to 2/10; 06/04/21 unchaged    Time 6    Period Weeks    Status Achieved                   Plan - 06/04/21 1325     Clinical Impression Statement Todays session addressed progress towards goals and ensuring patient was proficient with his HEP to self manage symptoms going forward.  All goals are considered met and patient is able to demo his HEP effectively.  Symptoms range from 0-2/10 on average.  Grip strength now equal B and cervical mobility is WNL all planes.  Patient read for self management.  L first rib dysfunction appear resolved.    Personal Factors and Comorbidities Age;Time since onset of injury/illness/exacerbation;Profession;Past/Current Experience    Examination-Activity Limitations Lift;Carry    Examination-Participation Restrictions Occupation    Stability/Clinical Decision Making Evolving/Moderate complexity    Rehab Potential Good    PT Frequency 2x / week    PT Duration 6 weeks    PT Treatment/Interventions ADLs/Self Care Home Management;Electrical Stimulation;Moist Heat;Traction;Functional mobility training;Therapeutic activities;Therapeutic exercise;Neuromuscular re-education;Manual techniques;Patient/family education;Passive range of motion;Dry needling;Taping;Spinal Manipulations    PT Next Visit Plan DC    PT Home Exercise Plan Access Code: [email or web address]    Consulted and Agree with Plan of Care Patient             Patient will benefit from skilled therapeutic intervention in order to improve the following deficits and impairments:  Increased fascial  restricitons, Impaired UE functional use, Pain, Decreased mobility, Decreased strength, Impaired sensation, Postural dysfunction  Visit Diagnosis: Cervicalgia  Muscle weakness (generalized)  Other disturbances of skin sensation  Chronic left shoulder pain     Problem List Patient Active Problem List   Diagnosis Date Noted   Paresthesia 05/21/2021   Vitamin  D deficiency disease 08/28/2019   Prediabetes 08/28/2019    Lanice Shirts, PT 06/04/2021, 1:35 PM  [facility] [address] [place], [place], [address] Phone: [phone]   Fax:  [phone]  Name: LEO FRAY MRN: [id number] Date of Birth: Jan 24, 1981

## 2021-08-26 ENCOUNTER — Other Ambulatory Visit (HOSPITAL_COMMUNITY): Payer: Self-pay | Admitting: Nurse Practitioner

## 2021-08-26 ENCOUNTER — Other Ambulatory Visit: Payer: Self-pay | Admitting: Nurse Practitioner

## 2021-08-26 DIAGNOSIS — S46911A Strain of unspecified muscle, fascia and tendon at shoulder and upper arm level, right arm, initial encounter: Secondary | ICD-10-CM

## 2021-09-03 ENCOUNTER — Other Ambulatory Visit (HOSPITAL_COMMUNITY): Payer: Self-pay | Admitting: Nurse Practitioner

## 2021-09-03 ENCOUNTER — Encounter: Payer: Self-pay | Admitting: Internal Medicine

## 2021-09-03 DIAGNOSIS — S46911A Strain of unspecified muscle, fascia and tendon at shoulder and upper arm level, right arm, initial encounter: Secondary | ICD-10-CM

## 2021-09-10 ENCOUNTER — Ambulatory Visit (HOSPITAL_COMMUNITY): Payer: 59

## 2021-09-10 ENCOUNTER — Encounter (HOSPITAL_COMMUNITY): Payer: Self-pay

## 2021-11-05 ENCOUNTER — Encounter (INDEPENDENT_AMBULATORY_CARE_PROVIDER_SITE_OTHER): Payer: 59 | Admitting: Nurse Practitioner

## 2021-12-16 ENCOUNTER — Other Ambulatory Visit: Payer: Self-pay

## 2021-12-16 ENCOUNTER — Encounter: Payer: Self-pay | Admitting: Gastroenterology

## 2021-12-16 ENCOUNTER — Ambulatory Visit: Payer: 59 | Admitting: Gastroenterology

## 2021-12-16 DIAGNOSIS — K921 Melena: Secondary | ICD-10-CM | POA: Insufficient documentation

## 2021-12-16 NOTE — Patient Instructions (Signed)
We are arranging an upper endoscopy with Dr. Marletta Lor in the near future! ? ?Please call if any further black stool! ? ?Continue to avoid medications like Ibuprofen, Advil, Aleve, Motrin, Goody powders, BC powders. ? ?It was a pleasure to see you today. I want to create trusting relationships with patients to provide genuine, compassionate, and quality care. I value your feedback. If you receive a survey regarding your visit,  I greatly appreciate you taking time to fill this out.  ? ?Gelene Mink, PhD, ANP-BC ?[facility]  ? ?

## 2021-12-16 NOTE — Progress Notes (Signed)
? ? ?Gastroenterology Office Note   ? ?Referring Provider: No ref. provider found ?Primary Care Physician:  Benita Stabile, MD  ?Primary GI: Dr. Marletta Lor ? ? ?Chief Complaint  ? ?Chief Complaint  ?Patient presents with  ? pt was taking aleve during a muscle strain  ?  Pt was taking a lot of Aleve and a muscle relaxer due to muscle strain. Now dark stools starting to clear up  ? Melena  ? ? ? ?History of Present Illness  ? ?Herbert Davis is a 41 y.o. male presenting today at the request of Benita Stabile, MD due to reports of melena in December 2022. Outside labs Feb 2023 with Hgb 16.2.  ? ?2-3 Aleve a day for about 3-4 days in December. Had black stool at that time. Had muscle strain at that time. Had LUQ pain at that time. No further pain. No N/V. No dysphagia. Some indigestion. Occasional Tums. No constipation or diarrhea. No weight loss or lack of appetite. No further melena. No hematochezia.  ? ?No family history of colorectal cancer or polyps. No other GI concerns today.  ? ? ? ? ? ?Past Medical History:  ?Diagnosis Date  ? Pilonidal cyst   ? Prediabetes 08/28/2019  ? Vitamin D deficiency disease 08/28/2019  ? ? ?Past Surgical History:  ?Procedure Laterality Date  ? PILONIDAL CYST EXCISION N/A 12/11/2019  ? Procedure: CYST EXCISION PILONIDAL;  Surgeon: Franky Macho, MD;  Location: [facility];  Service: General;  Laterality: N/A;  ? WISDOM TOOTH EXTRACTION    ? ? ?Current Outpatient Medications  ?Medication Sig Dispense Refill  ? Cholecalciferol (VITAMIN D-3) 125 MCG (5000 UT) TABS Take 1 tablet by mouth every other day 30 tablet 0  ? ?No current facility-administered medications for this visit.  ? ? ?Allergies as of 12/16/2021  ? (No Known Allergies)  ? ? ?Family History  ?Problem Relation Age of Onset  ? Healthy Mother   ? Healthy Father   ? Healthy Sister   ? Healthy Brother   ? Healthy Brother   ? Colon cancer Neg Hx   ? Colon polyps Neg Hx   ? ? ?Social History  ? ?Socioeconomic History  ? Marital status: Single  ?   Spouse name: Not on file  ? Number of children: Not on file  ? Years of education: Not on file  ? Highest education level: Not on file  ?Occupational History  ? Occupation: Full time  ? Occupation: [employer]  ?Tobacco Use  ? Smoking status: Never  ? Smokeless tobacco: Never  ?Vaping Use  ? Vaping Use: Never used  ?Substance and Sexual Activity  ? Alcohol use: No  ? Drug use: No  ? Sexual activity: Yes  ?Other Topics Concern  ? Not on file  ?Social History Narrative  ? Lives with parents .  ? Left Handed  ? Drinks 2-3 cups caffeine daily  ? ?Social Determinants of Health  ? ?Financial Resource Strain: Not on file  ?Food Insecurity: Not on file  ?Transportation Needs: Not on file  ?Physical Activity: Not on file  ?Stress: Not on file  ?Social Connections: Not on file  ?Intimate Partner Violence: Not on file  ? ? ? ?Review of Systems  ? ?Gen: Denies any fever, chills, fatigue, weight loss, lack of appetite.  ?CV: Denies chest pain, heart palpitations, peripheral edema, syncope.  ?Resp: Denies shortness of breath at rest or with exertion. Denies wheezing or cough.  ?GI: Denies dysphagia or  odynophagia. Denies jaundice, hematemesis, fecal incontinence. ?GU : Denies urinary burning, urinary frequency, urinary hesitancy ?MS: Denies joint pain, muscle weakness, cramps, or limitation of movement.  ?Derm: Denies rash, itching, dry skin ?Psych: Denies depression, anxiety, memory loss, and confusion ?Heme: Denies bruising, bleeding, and enlarged lymph nodes. ? ? ?Physical Exam  ? ?BP 126/80   Pulse 100   Temp 97.7 ?F (36.5 ?C)   Ht 5\' 11"  (1.803 m)   Wt 208 lb 3.2 oz (94.4 kg)   BMI 29.04 kg/m?  ?General:   Alert and oriented. Pleasant and cooperative. Well-nourished and well-developed.  ?Head:  Normocephalic and atraumatic. ?Eyes:  Without icterus ?Ears:  Normal auditory acuity. ?Lungs:  Clear to auscultation bilaterally.  ?Heart:  S1, S2 present without murmurs appreciated.  ?Abdomen:  +BS, soft, mild TTP LUQ and  non-distended. No HSM noted. No guarding or rebound. No masses appreciated.  ?Rectal:  Deferred  ?Msk:  Symmetrical without gross deformities. Normal posture. ?Extremities:  Without edema. ?Neurologic:  Alert and  oriented x4;  grossly normal neurologically. ?Skin:  Intact without significant lesions or rashes. ?Psych:  Alert and cooperative. Normal mood and affect. ? ? ?Assessment  ? ?Herbert Davis is a 40 y.o. male presenting today with reports of melena in December after taking Aleve for several days, associated dyspepsia, now all resolved. ? ?Hgb normal in February 2023. He has had no further evidence for black stool. Denies dyspepsia. Very mild TTP LUQ on exam today. ? ?We discussed diagnostic EGD. He is to call if any further evidence for possible melena. Continue to avoid NSAIDs.  ? ? ?PLAN  ? ?Proceed with upper endoscopy by Dr. March 2023 in near future: the risks, benefits, and alternatives have been discussed with the patient in detail. The patient states understanding and desires to proceed.  ? ?Avoid NSAIDs ? ?Call if further recurrence ? ?Marletta Lor, PhD, ANP-BC ?[facility]  ? ? ? ?

## 2021-12-16 NOTE — H&P (View-Only) (Signed)
? ? ?Gastroenterology Office Note   ? ?Referring Provider: No ref. provider found ?Primary Care Physician:  Benita Stabile, MD  ?Primary GI: Dr. Marletta Lor ? ? ?Chief Complaint  ? ?Chief Complaint  ?Patient presents with  ? pt was taking aleve during a muscle strain  ?  Pt was taking a lot of Aleve and a muscle relaxer due to muscle strain. Now dark stools starting to clear up  ? Melena  ? ? ? ?History of Present Illness  ? ?Herbert Davis is a 41 y.o. male presenting today at the request of Benita Stabile, MD due to reports of melena in December 2022. Outside labs Feb 2023 with Hgb 16.2.  ? ?2-3 Aleve a day for about 3-4 days in December. Had black stool at that time. Had muscle strain at that time. Had LUQ pain at that time. No further pain. No N/V. No dysphagia. Some indigestion. Occasional Tums. No constipation or diarrhea. No weight loss or lack of appetite. No further melena. No hematochezia.  ? ?No family history of colorectal cancer or polyps. No other GI concerns today.  ? ? ? ? ? ?Past Medical History:  ?Diagnosis Date  ? Pilonidal cyst   ? Prediabetes 08/28/2019  ? Vitamin D deficiency disease 08/28/2019  ? ? ?Past Surgical History:  ?Procedure Laterality Date  ? PILONIDAL CYST EXCISION N/A 12/11/2019  ? Procedure: CYST EXCISION PILONIDAL;  Surgeon: Franky Macho, MD;  Location: [facility];  Service: General;  Laterality: N/A;  ? WISDOM TOOTH EXTRACTION    ? ? ?Current Outpatient Medications  ?Medication Sig Dispense Refill  ? Cholecalciferol (VITAMIN D-3) 125 MCG (5000 UT) TABS Take 1 tablet by mouth every other day 30 tablet 0  ? ?No current facility-administered medications for this visit.  ? ? ?Allergies as of 12/16/2021  ? (No Known Allergies)  ? ? ?Family History  ?Problem Relation Age of Onset  ? Healthy Mother   ? Healthy Father   ? Healthy Sister   ? Healthy Brother   ? Healthy Brother   ? Colon cancer Neg Hx   ? Colon polyps Neg Hx   ? ? ?Social History  ? ?Socioeconomic History  ? Marital status: Single  ?   Spouse name: Not on file  ? Number of children: Not on file  ? Years of education: Not on file  ? Highest education level: Not on file  ?Occupational History  ? Occupation: Full time  ? Occupation: [employer]  ?Tobacco Use  ? Smoking status: Never  ? Smokeless tobacco: Never  ?Vaping Use  ? Vaping Use: Never used  ?Substance and Sexual Activity  ? Alcohol use: No  ? Drug use: No  ? Sexual activity: Yes  ?Other Topics Concern  ? Not on file  ?Social History Narrative  ? Lives with parents .  ? Left Handed  ? Drinks 2-3 cups caffeine daily  ? ?Social Determinants of Health  ? ?Financial Resource Strain: Not on file  ?Food Insecurity: Not on file  ?Transportation Needs: Not on file  ?Physical Activity: Not on file  ?Stress: Not on file  ?Social Connections: Not on file  ?Intimate Partner Violence: Not on file  ? ? ? ?Review of Systems  ? ?Gen: Denies any fever, chills, fatigue, weight loss, lack of appetite.  ?CV: Denies chest pain, heart palpitations, peripheral edema, syncope.  ?Resp: Denies shortness of breath at rest or with exertion. Denies wheezing or cough.  ?GI: Denies dysphagia or  odynophagia. Denies jaundice, hematemesis, fecal incontinence. ?GU : Denies urinary burning, urinary frequency, urinary hesitancy ?MS: Denies joint pain, muscle weakness, cramps, or limitation of movement.  ?Derm: Denies rash, itching, dry skin ?Psych: Denies depression, anxiety, memory loss, and confusion ?Heme: Denies bruising, bleeding, and enlarged lymph nodes. ? ? ?Physical Exam  ? ?BP 126/80   Pulse 100   Temp 97.7 ?F (36.5 ?C)   Ht 5' 11" (1.803 m)   Wt 208 lb 3.2 oz (94.4 kg)   BMI 29.04 kg/m?  ?General:   Alert and oriented. Pleasant and cooperative. Well-nourished and well-developed.  ?Head:  Normocephalic and atraumatic. ?Eyes:  Without icterus ?Ears:  Normal auditory acuity. ?Lungs:  Clear to auscultation bilaterally.  ?Heart:  S1, S2 present without murmurs appreciated.  ?Abdomen:  +BS, soft, mild TTP LUQ and  non-distended. No HSM noted. No guarding or rebound. No masses appreciated.  ?Rectal:  Deferred  ?Msk:  Symmetrical without gross deformities. Normal posture. ?Extremities:  Without edema. ?Neurologic:  Alert and  oriented x4;  grossly normal neurologically. ?Skin:  Intact without significant lesions or rashes. ?Psych:  Alert and cooperative. Normal mood and affect. ? ? ?Assessment  ? ?Herbert Davis is a 41 y.o. male presenting today with reports of melena in December after taking Aleve for several days, associated dyspepsia, now all resolved. ? ?Hgb normal in February 2023. He has had no further evidence for black stool. Denies dyspepsia. Very mild TTP LUQ on exam today. ? ?We discussed diagnostic EGD. He is to call if any further evidence for possible melena. Continue to avoid NSAIDs.  ? ? ?PLAN  ? ?Proceed with upper endoscopy by Dr. Carver in near future: the risks, benefits, and alternatives have been discussed with the patient in detail. The patient states understanding and desires to proceed.  ? ?Avoid NSAIDs ? ?Call if further recurrence ? ?Sherilee Smotherman W. Deronda Christian, PhD, ANP-BC ?[facility]  ? ? ? ?

## 2021-12-23 ENCOUNTER — Encounter (HOSPITAL_COMMUNITY)
Admission: RE | Disposition: A | Discharge status: Home / Self Care | Payer: Self-pay | Source: Home / Self Care | Attending: Internal Medicine

## 2021-12-23 ENCOUNTER — Ambulatory Visit (HOSPITAL_BASED_OUTPATIENT_CLINIC_OR_DEPARTMENT_OTHER): Payer: 59 | Admitting: Anesthesiology

## 2021-12-23 ENCOUNTER — Ambulatory Visit (HOSPITAL_COMMUNITY)
Admission: RE | Admit: 2021-12-23 | Discharge: 2021-12-23 | Disposition: A | Discharge status: Home / Self Care | Payer: 59 | Attending: Internal Medicine | Admitting: Internal Medicine

## 2021-12-23 ENCOUNTER — Encounter (HOSPITAL_COMMUNITY): Payer: Self-pay

## 2021-12-23 ENCOUNTER — Ambulatory Visit (HOSPITAL_COMMUNITY): Payer: 59 | Admitting: Anesthesiology

## 2021-12-23 ENCOUNTER — Other Ambulatory Visit: Payer: Self-pay

## 2021-12-23 DIAGNOSIS — K2970 Gastritis, unspecified, without bleeding: Secondary | ICD-10-CM | POA: Insufficient documentation

## 2021-12-23 DIAGNOSIS — K921 Melena: Secondary | ICD-10-CM | POA: Diagnosis not present

## 2021-12-23 DIAGNOSIS — K2971 Gastritis, unspecified, with bleeding: Secondary | ICD-10-CM | POA: Diagnosis not present

## 2021-12-23 DIAGNOSIS — K449 Diaphragmatic hernia without obstruction or gangrene: Secondary | ICD-10-CM | POA: Diagnosis not present

## 2021-12-23 DIAGNOSIS — M3119 Other thrombotic microangiopathy: Secondary | ICD-10-CM | POA: Insufficient documentation

## 2021-12-23 HISTORY — PX: ESOPHAGOGASTRODUODENOSCOPY (EGD) WITH PROPOFOL: SHX5813

## 2021-12-23 SURGERY — ESOPHAGOGASTRODUODENOSCOPY (EGD) WITH PROPOFOL
Anesthesia: General

## 2021-12-23 MED ORDER — LIDOCAINE HCL (CARDIAC) PF 100 MG/5ML IV SOSY
PREFILLED_SYRINGE | INTRAVENOUS | Status: DC | PRN
  Administered 2021-12-23: 50 mg via INTRAVENOUS

## 2021-12-23 MED ORDER — PROPOFOL 10 MG/ML IV BOLUS
INTRAVENOUS | Status: DC | PRN
  Administered 2021-12-23: 50 mg via INTRAVENOUS
  Administered 2021-12-23: 150 mg via INTRAVENOUS
  Administered 2021-12-23: 50 mg via INTRAVENOUS

## 2021-12-23 MED ORDER — LACTATED RINGERS IV SOLN: INTRAVENOUS | Status: DC

## 2021-12-23 NOTE — Anesthesia Procedure Notes (Signed)
Date/Time: 12/23/2021 1:08 PM ?Performed by: Julian Reil, CRNA ?Pre-anesthesia Checklist: Patient identified, Emergency Drugs available, Suction available and Patient being monitored ?Patient Re-evaluated:Patient Re-evaluated prior to induction ?Oxygen Delivery Method: Nasal cannula ?Induction Type: IV induction ?Placement Confirmation: positive ETCO2 ? ? ? ? ?

## 2021-12-23 NOTE — Anesthesia Preprocedure Evaluation (Addendum)
Anesthesia Evaluation  ?Patient identified by MRN, date of birth, ID band ?Patient awake ? ? ? ?Reviewed: ?Allergy & Precautions, NPO status , Patient's Chart, lab work & pertinent test results ? ?Airway ?Mallampati: III ? ?TM Distance: >3 FB ?Neck ROM: Full ? ? ? Dental ? ?(+) Dental Advisory Given, Teeth Intact ?  ?Pulmonary ?neg pulmonary ROS,  ?  ?Pulmonary exam normal ?breath sounds clear to auscultation ? ? ? ? ? ? Cardiovascular ?negative cardio ROS ?Normal cardiovascular exam ?Rhythm:Regular Rate:Normal ? ? ?  ?Neuro/Psych ?negative neurological ROS ? negative psych ROS  ? GI/Hepatic ?negative GI ROS, Neg liver ROS,   ?Endo/Other  ?negative endocrine ROS ? Renal/GU ?negative Renal ROS  ?negative genitourinary ?  ?Musculoskeletal ?negative musculoskeletal ROS ?(+)  ? Abdominal ?  ?Peds ?negative pediatric ROS ?(+)  Hematology ?negative hematology ROS ?(+)   ?Anesthesia Other Findings ? ? Reproductive/Obstetrics ?negative OB ROS ? ?  ? ? ? ? ? ? ? ? ? ? ? ? ? ?  ?  ? ? ? ? ? ? ? ?Anesthesia Physical ?Anesthesia Plan ? ?ASA: 2 ? ?Anesthesia Plan: General  ? ?Post-op Pain Management: Minimal or no pain anticipated  ? ?Induction: Intravenous ? ?PONV Risk Score and Plan: TIVA ? ?Airway Management Planned: Nasal Cannula and Natural Airway ? ?Additional Equipment:  ? ?Intra-op Plan:  ? ?Post-operative Plan:  ? ?Informed Consent: I have reviewed the patients History and Physical, chart, labs and discussed the procedure including the risks, benefits and alternatives for the proposed anesthesia with the patient or authorized representative who has indicated his/her understanding and acceptance.  ? ? ? ?Dental advisory given ? ?Plan Discussed with: CRNA and Surgeon ? ?Anesthesia Plan Comments:   ? ? ? ? ? ? ?Anesthesia Quick Evaluation ? ?

## 2021-12-23 NOTE — Interval H&P Note (Signed)
History and Physical Interval Note: ? ?12/23/2021 ?1:01 PM ? ?Herbert Davis  has presented today for surgery, with the diagnosis of melena.  The various methods of treatment have been discussed with the patient and family. After consideration of risks, benefits and other options for treatment, the patient has consented to  Procedure(s) with comments: ?ESOPHAGOGASTRODUODENOSCOPY (EGD) WITH PROPOFOL (N/A) - 2:00pm as a surgical intervention.  The patient's history has been reviewed, patient examined, no change in status, stable for surgery.  I have reviewed the patient's chart and labs.  Questions were answered to the patient's satisfaction.   ? ? ?Eloise Harman ? ? ?

## 2021-12-23 NOTE — Anesthesia Postprocedure Evaluation (Signed)
Anesthesia Post Note ? ?Patient: Herbert Davis ? ?Procedure(s) Performed: ESOPHAGOGASTRODUODENOSCOPY (EGD) WITH PROPOFOL ? ?Patient location during evaluation: Endoscopy ?Anesthesia Type: General ?Level of consciousness: awake and alert and oriented ?Pain management: pain level controlled ?Vital Signs Assessment: post-procedure vital signs reviewed and stable ?Respiratory status: spontaneous breathing, nonlabored ventilation and respiratory function stable ?Cardiovascular status: blood pressure returned to baseline and stable ?Postop Assessment: no apparent nausea or vomiting ?Anesthetic complications: no ? ? ?No notable events documented. ? ? ?Last Vitals:  ?Vitals:  ? 12/23/21 1316 12/23/21 1320  ?BP:  (!) 100/41  ?Pulse: 85   ?Resp: 16   ?Temp: 36.7 ?C   ?SpO2:  93%  ?  ?Last Pain:  ?Vitals:  ? 12/23/21 1320  ?TempSrc:   ?PainSc: 0-No pain  ? ? ?  ?  ?  ?  ?  ?  ? ?[name] ? ? ? ? ?

## 2021-12-23 NOTE — Discharge Instructions (Addendum)
EGD ?Discharge instructions ?Please read the instructions outlined below and refer to this sheet in the next few weeks. These discharge instructions provide you with general information on caring for yourself after you leave the hospital. Your doctor may also give you specific instructions. While your treatment has been planned according to the most current medical practices available, unavoidable complications occasionally occur. If you have any problems or questions after discharge, please call your doctor. ?ACTIVITY ?You may resume your regular activity but move at a slower pace for the next 24 hours.  ?Take frequent rest periods for the next 24 hours.  ?Walking will help expel (get rid of) the air and reduce the bloated feeling in your abdomen.  ?No driving for 24 hours (because of the anesthesia (medicine) used during the test).  ?You may shower.  ?Do not sign any important legal documents or operate any machinery for 24 hours (because of the anesthesia used during the test).  ?NUTRITION ?Drink plenty of fluids.  ?You may resume your normal diet.  ?Begin with a light meal and progress to your normal diet.  ?Avoid alcoholic beverages for 24 hours or as instructed by your caregiver.  ?MEDICATIONS ?You may resume your normal medications unless your caregiver tells you otherwise.  ?WHAT YOU CAN EXPECT TODAY ?You may experience abdominal discomfort such as a feeling of fullness or ?gas? pains.  ?FOLLOW-UP ?Your doctor will discuss the results of your test with you.  ?SEEK IMMEDIATE MEDICAL ATTENTION IF ANY OF THE FOLLOWING OCCUR: ?Excessive nausea (feeling sick to your stomach) and/or vomiting.  ?Severe abdominal pain and distention (swelling).  ?Trouble swallowing.  ?Temperature over 101? F (37.8? C).  ?Rectal bleeding or vomiting of blood.  ? ? ?Overall, esophagus stomach and small bowel appeared healthy.  You do have a moderate size hiatal hernia.  Likely when you were taking Aleve regularly this caused  inflammation of the area.  Would caution using NSAIDs sparingly.  If you have worsening of your symptoms, we can consider trial of chronic PPI such as Prilosec.  Otherwise follow-up with Tobi Bastos in 6 months ? ?I hope you have a great rest of your week! ? ?Hennie Duos. Marletta Lor, D.O. ?Gastroenterology and Hepatology ?[facility] ? ?

## 2021-12-23 NOTE — Transfer of Care (Signed)
Immediate Anesthesia Transfer of Care Note ? ?Patient: Herbert Davis ? ?Procedure(s) Performed: ESOPHAGOGASTRODUODENOSCOPY (EGD) WITH PROPOFOL ? ?Patient Location: Endoscopy Unit ? ?Anesthesia Type:General ? ?Level of Consciousness: drowsy ? ?Airway & Oxygen Therapy: Patient Spontanous Breathing ? ?Post-op Assessment: Report given to RN and Post -op Vital signs reviewed and stable ? ?Post vital signs: Reviewed and stable ? ?Last Vitals:  ?Vitals Value Taken Time  ?BP    ?Temp    ?Pulse    ?Resp    ?SpO2    ? ? ?Last Pain:  ?Vitals:  ? 12/23/21 1307  ?TempSrc:   ?PainSc: 1   ?   ? ?Patients Stated Pain Goal: 6 (12/23/21 1241) ? ?Complications: No notable events documented. ?

## 2021-12-23 NOTE — Op Note (Signed)
[facility] ?Patient Name: Herbert Davis ?Procedure Date: 12/23/2021 12:47 PM ?MRN: [id number] ?Date of Birth: 19-Jun-1981 ?Attending MD: Elon Alas. Abbey Chatters , DO ?CSN: [id number] ?Age: 41 ?Admit Type: Outpatient ?Procedure:                Upper GI endoscopy ?Indications:              Melena ?Providers:                Elon Alas. Abbey Chatters, DO, Charlsie Quest. Theda Sers RN, Therapist, sports,  ?                          Aram Candela ?Referring MD:              ?Medicines:                See the Anesthesia note for documentation of the  ?                          administered medications ?Complications:            No immediate complications. ?Estimated Blood Loss:     Estimated blood loss was minimal. ?Procedure:                Pre-Anesthesia Assessment: ?                          - The anesthesia plan was to use monitored  ?                          anesthesia care (MAC). ?                          After obtaining informed consent, the endoscope was  ?                          passed under direct vision. Throughout the  ?                          procedure, the patient's blood pressure, pulse, and  ?                          oxygen saturations were monitored continuously. The  ?                          GIF-H190 ([id number]) scope was introduced through the  ?                          mouth, and advanced to the second part of duodenum.  ?                          The upper GI endoscopy was accomplished without  ?                          difficulty. The patient tolerated the procedure  ?                          well. ?Scope In: 1:09:29 PM ?Scope  Out: 1:12:44 PM ?Total Procedure Duration: 0 hours 3 minutes 15 seconds  ?Findings: ?     A 5 cm hiatal hernia was present. Few small erosions noted ?     Patchy mild inflammation characterized by erythema was found in the  ?     gastric fundus. Biopsies were taken with a cold forceps for Helicobacter  ?     pylori testing. ?     The duodenal bulb, first portion of the duodenum and second portion of  ?      the duodenum were normal. ?Impression:               - 5 cm hiatal hernia. ?                          - Gastritis. Biopsied. ?                          - Normal duodenal bulb, first portion of the  ?                          duodenum and second portion of the duodenum. ?Moderate Sedation: ?     Per Anesthesia Care ?Recommendation:           - Patient has a contact number available for  ?                          emergencies. The signs and symptoms of potential  ?                          delayed complications were discussed with the  ?                          patient. Return to normal activities tomorrow.  ?                          Written discharge instructions were provided to the  ?                          patient. ?                          - Resume previous diet. ?                          - Continue present medications. ?                          - Await pathology results. ?                          - If symptoms return, consider PPI therapy. ?Procedure Code(s):        --- Professional --- ?                          [phone], Esophagogastroduodenoscopy, flexible,  ?                          transoral; with biopsy,  single or multiple ?Diagnosis Code(s):        --- Professional --- ?                          K44.9, Diaphragmatic hernia without obstruction or  ?                          gangrene ?                          K29.70, Gastritis, unspecified, without bleeding ?                          K92.1, Melena (includes Hematochezia) ?CPT copyright 2019 American Medical Association. All rights reserved. ?The codes documented in this report are preliminary and upon coder review may  ?be revised to meet current compliance requirements. ?Elon Alas. Abbey Chatters, DO ?Elon Alas. [place], DO ?12/23/2021 1:18:19 PM ?This report has been signed electronically. ?Number of Addenda: 0 ?

## 2021-12-24 LAB — SURGICAL PATHOLOGY

## 2021-12-29 ENCOUNTER — Encounter (HOSPITAL_COMMUNITY): Payer: Self-pay | Admitting: Internal Medicine

## 2022-04-16 ENCOUNTER — Encounter: Payer: Self-pay | Admitting: Gastroenterology

## 2022-06-22 IMAGING — DX DG CHEST 2V
2 series · 2 of 2 positions shown · non-contrast
Comparison: 06/14/2010

CLINICAL DATA: Chest pain

EXAM:
CHEST - 2 VIEW

[chest pa]
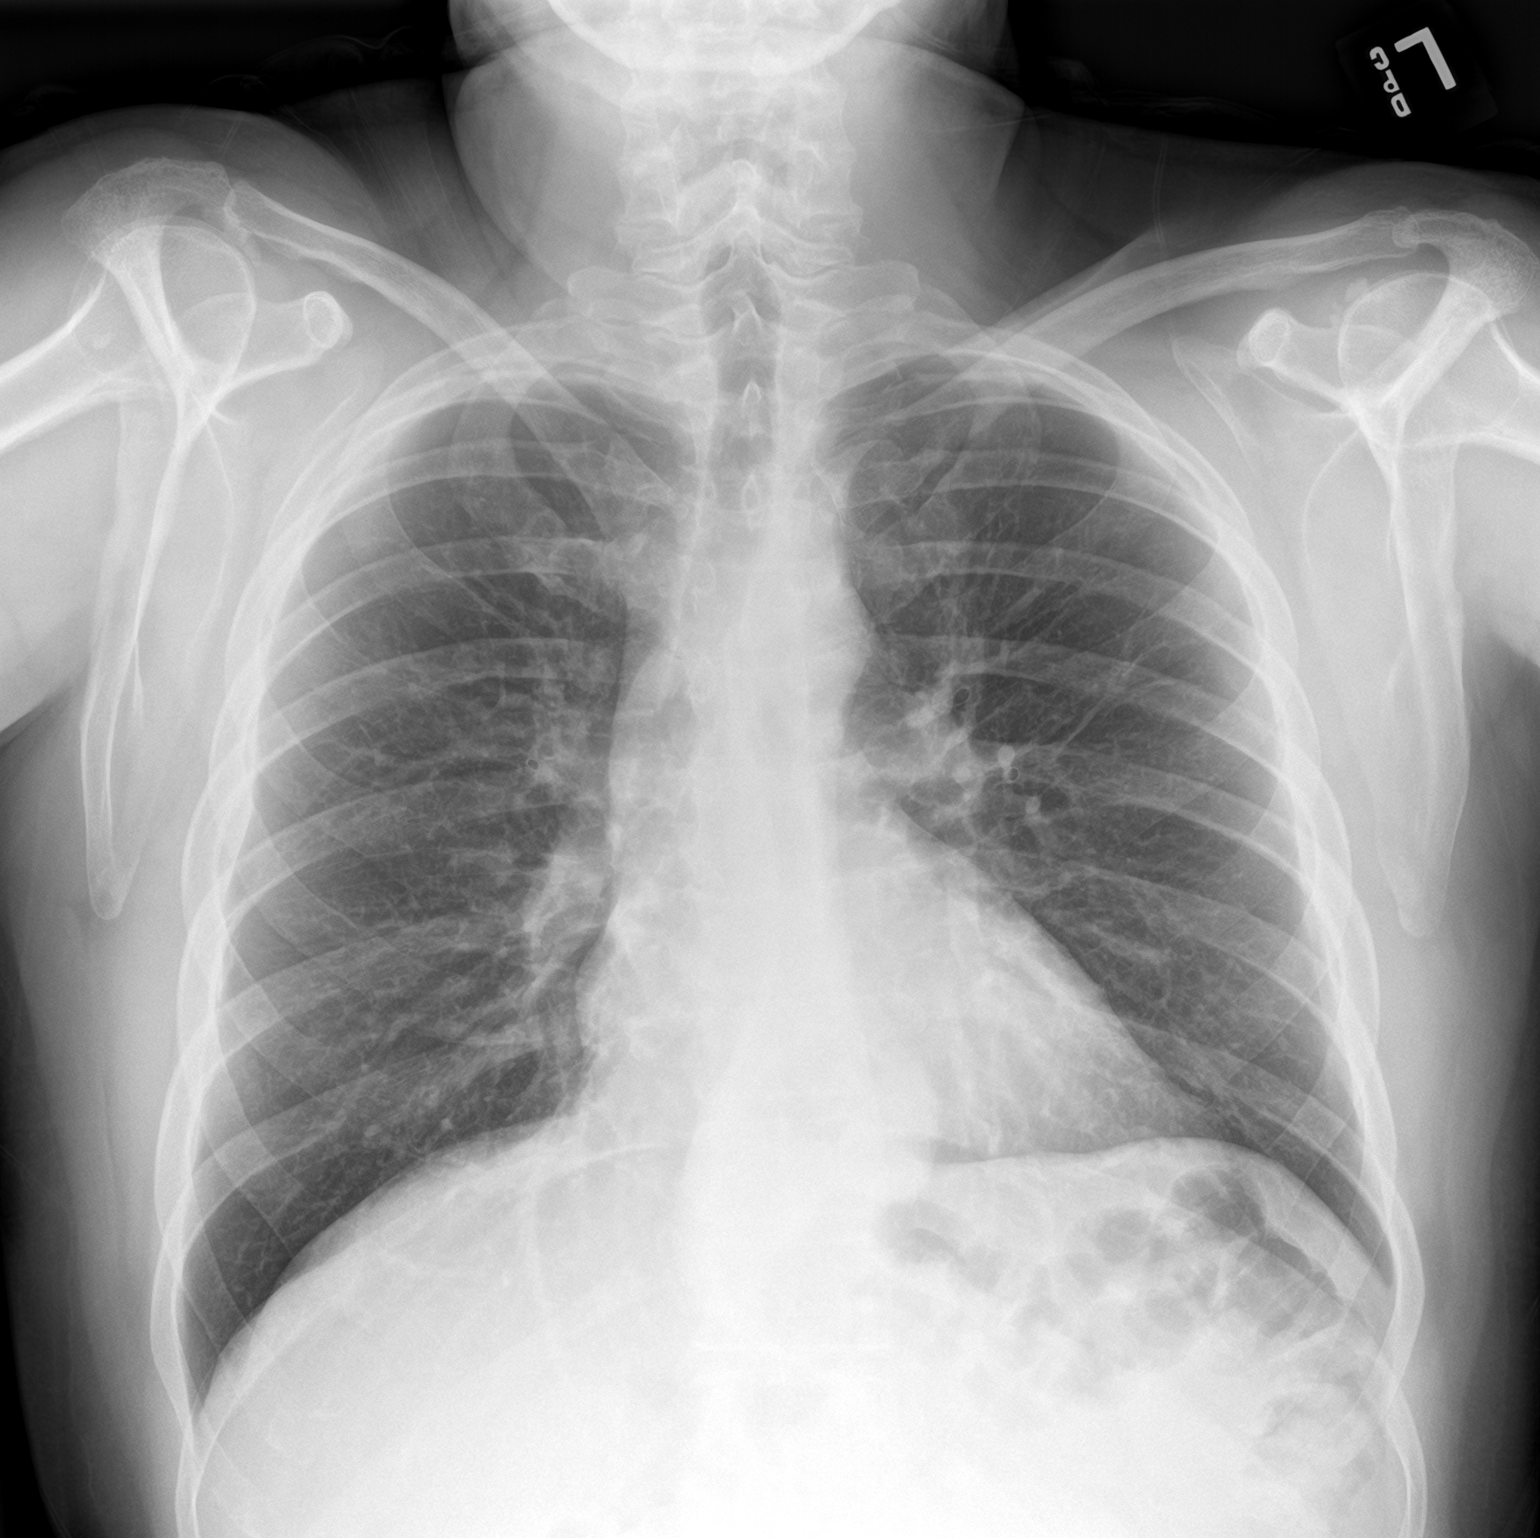

[chest lat]
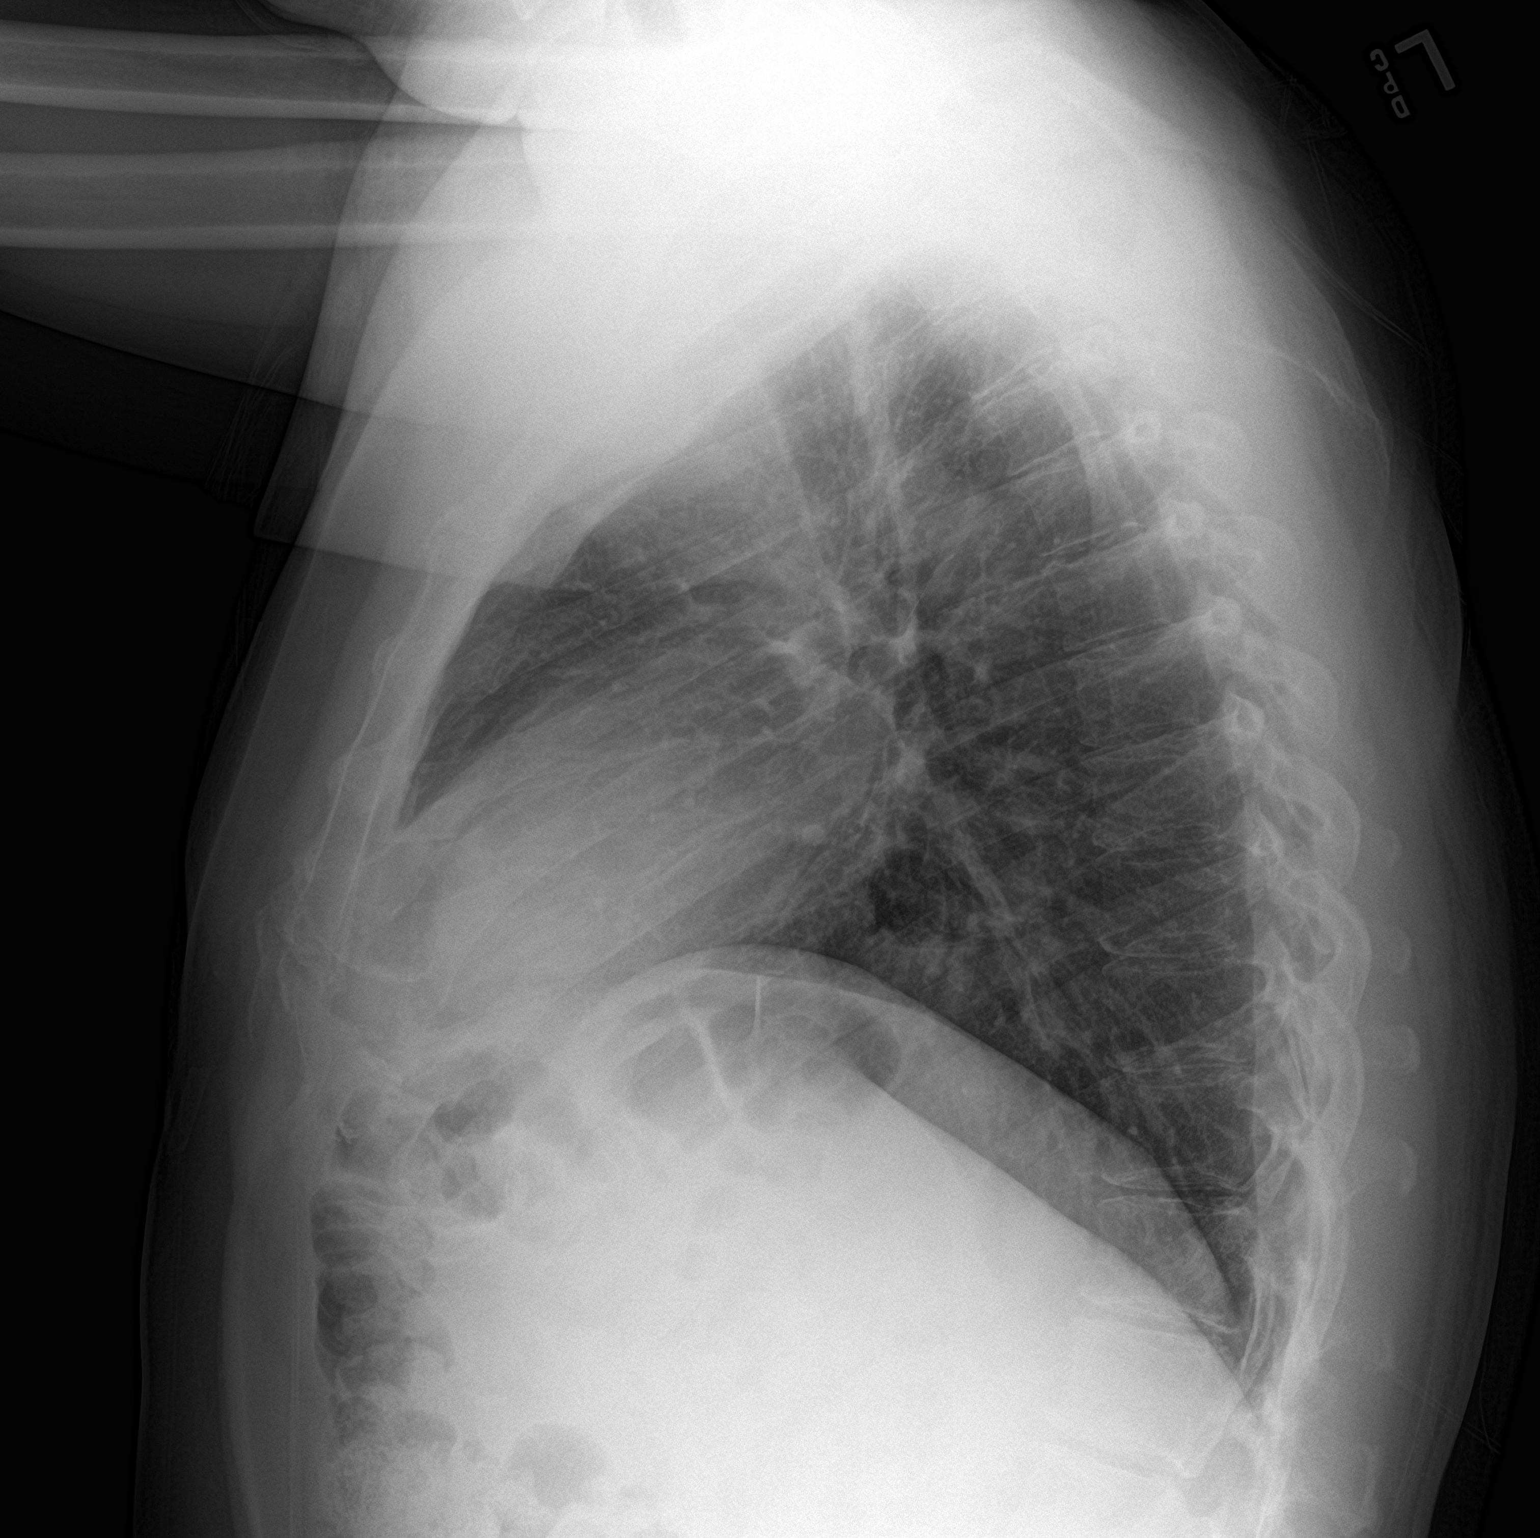

[2 of 2 positions shown; findings below may reference images not displayed]

FINDINGS: Small hiatal hernia. Heart and mediastinal contours are within
normal limits. No focal opacities or effusions. No acute bony
abnormality.
IMPRESSION: No active cardiopulmonary disease.

## 2022-06-24 ENCOUNTER — Encounter: Payer: Self-pay | Admitting: Gastroenterology

## 2022-06-24 ENCOUNTER — Ambulatory Visit (INDEPENDENT_AMBULATORY_CARE_PROVIDER_SITE_OTHER): Payer: 59 | Admitting: Gastroenterology

## 2022-06-24 DIAGNOSIS — K219 Gastro-esophageal reflux disease without esophagitis: Secondary | ICD-10-CM | POA: Diagnosis not present

## 2022-06-24 NOTE — Progress Notes (Signed)
Gastroenterology Office Note     Primary Care Physician:  Celene Squibb, MD  Primary Gastroenterologist: Dr. Abbey Chatters   Chief Complaint   Chief Complaint  Patient presents with   Follow-up    Patient coming in today for a follow up. Last seen for dark stools. Has not noticed any dark stools or blood. Has BM every day. Occasional heart burn. Tums helps some. Has to take tums about twice a week.      History of Present Illness   Herbert Davis is a 41 y.o. male presenting today in follow-up with a history of reported melena and dyspepsia. He underwent EGD in interim from last visit with  5 cm hiatal hernia, gastritis, normal duodenum. Negative H.pylori.   Reports intermittent indigestion depending on food choices. Will take Tums once to twice a week. No dysphagia. Rare LUQ abdominal pain. No further melena. No overt GI bleeding. He wants to avoid PPI therapy right now and stick with OTC options and diet management.     Past Medical History:  Diagnosis Date   Pilonidal cyst    Prediabetes 08/28/2019   Vitamin D deficiency disease 08/28/2019    Past Surgical History:  Procedure Laterality Date   ESOPHAGOGASTRODUODENOSCOPY (EGD) WITH PROPOFOL N/A 12/23/2021   Procedure: ESOPHAGOGASTRODUODENOSCOPY (EGD) WITH PROPOFOL;  Surgeon: Eloise Harman, DO;  Location: [facility];  Service: Endoscopy;  Laterality: N/A;  2:00pm   PILONIDAL CYST EXCISION N/A 12/11/2019   Procedure: CYST EXCISION PILONIDAL;  Surgeon: Aviva Signs, MD;  Location: [facility];  Service: General;  Laterality: N/A;   WISDOM TOOTH EXTRACTION      Current Outpatient Medications  Medication Sig Dispense Refill   calcium carbonate (TUMS - DOSED IN MG ELEMENTAL CALCIUM) 500 MG chewable tablet Chew 1 tablet by mouth daily as needed for indigestion or heartburn.     Cholecalciferol (VITAMIN D-3) 125 MCG (5000 UT) TABS Take 1 tablet by mouth every other day 30 tablet 0   No current facility-administered medications for  this visit.    Allergies as of 06/24/2022   (No Known Allergies)    Family History  Problem Relation Age of Onset   Healthy Mother    Healthy Father    Healthy Sister    Healthy Brother    Healthy Brother    Colon cancer Neg Hx    Colon polyps Neg Hx     Social History   Socioeconomic History   Marital status: Single    Spouse name: Not on file   Number of children: Not on file   Years of education: Not on file   Highest education level: Not on file  Occupational History   Occupation: Full time   Occupation: Veterinary surgeon  Tobacco Use   Smoking status: Never    Passive exposure: Past   Smokeless tobacco: Never  Vaping Use   Vaping Use: Never used  Substance and Sexual Activity   Alcohol use: No   Drug use: No   Sexual activity: Yes  Other Topics Concern   Not on file  Social History Narrative   Lives with parents .   Left Handed   Drinks 2-3 cups caffeine daily   Social Determinants of Health   Financial Resource Strain: Not on file  Food Insecurity: Not on file  Transportation Needs: Not on file  Physical Activity: Not on file  Stress: Not on file  Social Connections: Not on file  Intimate Partner Violence: Not on file  Review of Systems   Gen: Denies any fever, chills, fatigue, weight loss, lack of appetite.  CV: Denies chest pain, heart palpitations, peripheral edema, syncope.  Resp: Denies shortness of breath at rest or with exertion. Denies wheezing or cough.  GI: Denies dysphagia or odynophagia. Denies jaundice, hematemesis, fecal incontinence. GU : Denies urinary burning, urinary frequency, urinary hesitancy MS: Denies joint pain, muscle weakness, cramps, or limitation of movement.  Derm: Denies rash, itching, dry skin Psych: Denies depression, anxiety, memory loss, and confusion Heme: Denies bruising, bleeding, and enlarged lymph nodes.   Physical Exam   BP 120/77 (BP Location: Left Arm, Patient Position: Sitting, Cuff Size: Large)    Pulse 80   Temp 97.7 F (36.5 C) (Oral)   Ht 5\' 11"  (1.803 m)   Wt 215 lb 3.2 oz (97.6 kg)   BMI 30.01 kg/m  General:   Alert and oriented. Pleasant and cooperative. Well-nourished and well-developed.  Head:  Normocephalic and atraumatic. Eyes:  Without icterus Abdomen:  +BS, soft, mild TTP LUQ and non-distended. No HSM noted. No guarding or rebound. No masses appreciated.  Rectal:  Deferred  Msk:  Symmetrical without gross deformities. Normal posture. Extremities:  Without edema. Neurologic:  Alert and  oriented x4;  grossly normal neurologically. Skin:  Intact without significant lesions or rashes. Psych:  Alert and cooperative. Normal mood and affect.   Assessment   Herbert Davis is a 41 y.o. male presenting today in follow-up with a history of reported melena and dyspepsia. He underwent EGD in interim from last visit with  5 cm hiatal hernia, gastritis, normal duodenum. Negative H.pylori.   No further melena noted. GERD is occasional and managed with Tums as needed. No alarm signs/symptoms. He wants to avoid PPI therapy at this moment.     PLAN    Continue diet/behavior modification Call if any worsening of GERD, any dysphagia, any abdominal pain Return prn Colonoscopy at age 12    54, PhD, ANP-BC [facility]

## 2022-06-24 NOTE — Patient Instructions (Signed)
Please call if reflux worsens, any further black stool, bright red blood in stool, abdominal pain, or problems swallowing.  We will see you back as needed!  I enjoyed seeing you again today! As you know, I value our relationship and want to provide genuine, compassionate, and quality care. I welcome your feedback. If you receive a survey regarding your visit,  I greatly appreciate you taking time to fill this out. See you next time!  Annitta Needs, PhD, ANP-BC [facility]

## 2022-11-11 DIAGNOSIS — R7303 Prediabetes: Secondary | ICD-10-CM | POA: Diagnosis not present

## 2022-11-19 DIAGNOSIS — Z0000 Encounter for general adult medical examination without abnormal findings: Secondary | ICD-10-CM | POA: Diagnosis not present

## 2022-11-19 DIAGNOSIS — R7303 Prediabetes: Secondary | ICD-10-CM | POA: Diagnosis not present

## 2022-11-19 DIAGNOSIS — E785 Hyperlipidemia, unspecified: Secondary | ICD-10-CM | POA: Diagnosis not present
